# Patient Record
Sex: Female | Born: 1935
Health system: Southern US, Community
[De-identification: ages and names within clinical notes are randomized; demographics above are authoritative.]

## PROBLEM LIST (undated history)

## (undated) ENCOUNTER — Emergency Department (HOSPITAL_COMMUNITY): Discharge status: Absent without leave | Payer: Commercial Managed Care - HMO

## (undated) DIAGNOSIS — I5032 Chronic diastolic (congestive) heart failure: Secondary | ICD-10-CM

## (undated) DIAGNOSIS — M8580 Other specified disorders of bone density and structure, unspecified site: Secondary | ICD-10-CM

## (undated) DIAGNOSIS — E785 Hyperlipidemia, unspecified: Secondary | ICD-10-CM

## (undated) DIAGNOSIS — R0600 Dyspnea, unspecified: Secondary | ICD-10-CM

## (undated) DIAGNOSIS — I5189 Other ill-defined heart diseases: Secondary | ICD-10-CM

## (undated) DIAGNOSIS — I10 Essential (primary) hypertension: Secondary | ICD-10-CM

## (undated) HISTORY — DX: Other ill-defined heart diseases: I51.89

## (undated) HISTORY — DX: Chronic diastolic (congestive) heart failure: I50.32

## (undated) HISTORY — DX: Hyperlipidemia, unspecified: E78.5

## (undated) HISTORY — PX: APPENDECTOMY: SHX54

## (undated) HISTORY — PX: CATARACT EXTRACTION: SUR2

## (undated) HISTORY — PX: ROTATOR CUFF REPAIR: SHX139

## (undated) HISTORY — PX: CARDIAC VALVE REPLACEMENT: SHX585

## (undated) HISTORY — DX: Essential (primary) hypertension: I10

## (undated) HISTORY — DX: Other specified disorders of bone density and structure, unspecified site: M85.80

## (undated) HISTORY — DX: Dyspnea, unspecified: R06.00

## (undated) HISTORY — PX: TONSILLECTOMY: SUR1361

---

## 1998-04-13 ENCOUNTER — Inpatient Hospital Stay (HOSPITAL_COMMUNITY): Admission: RE | Admit: 1998-04-13 | Discharge: 1998-04-22 | Payer: Self-pay | Admitting: Cardiology

## 1998-09-07 ENCOUNTER — Ambulatory Visit (HOSPITAL_COMMUNITY): Admission: RE | Admit: 1998-09-07 | Discharge: 1998-09-07 | Payer: Self-pay | Admitting: Obstetrics & Gynecology

## 2001-01-16 ENCOUNTER — Ambulatory Visit (HOSPITAL_COMMUNITY): Admission: RE | Admit: 2001-01-16 | Discharge: 2001-01-16 | Payer: Self-pay | Admitting: Family Medicine

## 2001-01-16 ENCOUNTER — Encounter: Payer: Self-pay | Admitting: Family Medicine

## 2001-03-18 ENCOUNTER — Encounter (HOSPITAL_COMMUNITY): Admission: RE | Admit: 2001-03-18 | Discharge: 2001-06-16 | Payer: Self-pay | Admitting: Gastroenterology

## 2001-03-20 ENCOUNTER — Ambulatory Visit (HOSPITAL_COMMUNITY): Admission: RE | Admit: 2001-03-20 | Discharge: 2001-03-20 | Payer: Self-pay | Admitting: Gastroenterology

## 2001-03-20 ENCOUNTER — Encounter (INDEPENDENT_AMBULATORY_CARE_PROVIDER_SITE_OTHER): Payer: Self-pay | Admitting: Specialist

## 2001-11-14 ENCOUNTER — Encounter: Admission: RE | Admit: 2001-11-14 | Discharge: 2001-11-14 | Payer: Self-pay | Admitting: Family Medicine

## 2001-11-14 ENCOUNTER — Encounter: Payer: Self-pay | Admitting: Family Medicine

## 2001-12-13 ENCOUNTER — Ambulatory Visit (HOSPITAL_COMMUNITY): Admission: RE | Admit: 2001-12-13 | Discharge: 2001-12-13 | Payer: Self-pay | Admitting: Orthopaedic Surgery

## 2001-12-13 ENCOUNTER — Encounter: Payer: Self-pay | Admitting: Orthopaedic Surgery

## 2001-12-31 ENCOUNTER — Ambulatory Visit (HOSPITAL_BASED_OUTPATIENT_CLINIC_OR_DEPARTMENT_OTHER): Admission: RE | Admit: 2001-12-31 | Discharge: 2001-12-31 | Payer: Self-pay | Admitting: Orthopaedic Surgery

## 2003-08-19 ENCOUNTER — Other Ambulatory Visit: Admission: RE | Admit: 2003-08-19 | Discharge: 2003-08-19 | Payer: Self-pay | Admitting: Family Medicine

## 2004-07-14 ENCOUNTER — Ambulatory Visit (HOSPITAL_COMMUNITY): Admission: RE | Admit: 2004-07-14 | Discharge: 2004-07-14 | Payer: Self-pay | Admitting: Family Medicine

## 2005-07-25 ENCOUNTER — Ambulatory Visit (HOSPITAL_COMMUNITY): Admission: RE | Admit: 2005-07-25 | Discharge: 2005-07-25 | Payer: Self-pay | Admitting: Family Medicine

## 2005-08-15 ENCOUNTER — Ambulatory Visit (HOSPITAL_COMMUNITY): Admission: RE | Admit: 2005-08-15 | Discharge: 2005-08-15 | Payer: Self-pay | Admitting: Gastroenterology

## 2010-09-22 ENCOUNTER — Encounter: Admission: RE | Admit: 2010-09-22 | Discharge: 2010-09-22 | Payer: Self-pay | Admitting: Family Medicine

## 2010-09-23 ENCOUNTER — Ambulatory Visit (HOSPITAL_COMMUNITY): Admission: RE | Admit: 2010-09-23 | Discharge: 2010-09-23 | Payer: Self-pay | Admitting: Family Medicine

## 2011-03-24 NOTE — Op Note (Signed)
[place]  Patient:    Tracy Vargas, Tracy Vargas Visit Number: [id number] MRN: [id number]          Service Type: DSU Location: [facility] Attending Physician:  Marcene Corning Dictated by:   Lubertha Basque. Jerl Santos, M.D. Proc. Date: 12/31/01 Admit Date:  12/31/2001                             Operative Report  PREOPERATIVE DIAGNOSIS: 1. Left shoulder rotator cuff tear. 2. Left shoulder impingement.  POSTOPERATIVE DIAGNOSIS: 1. Left shoulder rotator cuff tear. 2. Left shoulder impingement.  OPERATION PERFORMED: 1. Left shoulder arthroscopic rotator cuff tear. 2. Left shoulder arthroscopic acromioplasty. 3. Left shoulder partial clavulectomy.  ANESTHESIA:  General and block.  ATTENDING SURGEON:  Lubertha Basque. Jerl Santos, M.D.  ASSISTANT:  Lindwood Qua, P.A.  INDICATIONS FOR PROCEDURE:  The patient is a 77 year old woman with about three months of intense left shoulder pain since a fall.  This has persisted despite oral anti-inflammatories and an injection which afforded her some transient relief.  She has undergone a preoperative MRI scan which showed significant tendinosis and a small full thickness rotator cuff tear.  She is offered an arthroscopy at this point.  The procedure was discussed with the patient and informed operative consent was obtained after discussion of possible complications of reaction to anesthesia and infection.  DESCRIPTION OF PROCEDURE:  The patient was taken to an operating suite where general anesthetic was applied without difficulty.  She was given a block in the preanesthesia area.  She was then positioned in beach chair position and prepped and draped in normal sterile fashion.  After the administration of preop intravenous antibiotics, an arthroscopy of the left shoulder was performed through a total of three portals.  The glenohumeral joint showed no degenerative change and the biceps tendon and all labral structures  well attached.   She had an obvious full thickness rotator cuff tear seen from below.  This was also seen from above.  She had a mild slope to her acromion which was addressed with an acromioplasty back to a flat surface.  This was done with a bur in the lateral position followed by transfer of the bur to the posterior position.  She also had some impingement under the acromioclavicular joint which was addressed with a partial clavulectomy without a formal decompression of the acromioclavicular joint itself.  The rotator cuff was examined and the anterior tear was well visualized directly at the attachmen6t point on the  greater tuberosity.  This was minimally retracted.  She appeared to have good tissues.  A bleeding bed of bone was created with an arthroscopic bur.  A standard rotator cuff anchor was placed but her bone was found to be fairly thin and this pulled up partially, as a result this was removed and the larger Arthrex anchor was placed with two Ethibond sutures emanating.  This appeared to be well fixed.  I passed one arm of each of the two sutures through the rotator cuff tear using the bird beak suture passer.  I then tied the knot above the rotator cuff.  This was a simple suture repair using both sutures.  We achieved a good reapproximation of cuff to the bleeding bed of bone.  The cuff appeared to be well repaired and under no tension with the arm at her side.  The shoulder was irrigated at the end of the case followed by placement of  Marcaine with epinephrine.  Simple sutures of nylon were used to loosely reapproximate her three portals followed by Adaptic and a dry gauze dressing and tape.  Estimated blood loss and intraoperative fluids can be obtained from Anesthesia records. DISPOSITION:  The patient was extubated in the operating room and taken the recovery room in stable condition.  Plans were for her to go home the same day and to follow up in the office in less than  a week.  I will contact her by phone tonight. Dictated by:   Lubertha Basque Jerl Santos, M.D. Attending Physician:  Marcene Corning DD:  12/31/01 TD:  12/31/01 Job: [id number]

## 2011-03-24 NOTE — Procedures (Signed)
[facility]  Patient:    Tracy Vargas, Tracy Vargas                         MRN: [id number] Proc. Date: 03/20/01 Adm. Date:  [id number] Attending:  Louie Bun CC:         Dario Guardian, M.D.  Timothy E. Earlene Plater, M.D.   Procedure Report  PROCEDURE:  Colonoscopy with polypectomy.  INDICATION FOR PROCEDURE:  Large rectal polyp found by Dr. Earlene Plater on digital rectal exam.  The patient has a prosthetic aortic valve and required reversal of Coumadin as well as prophylactic antibiotics for this procedure.  DESCRIPTION OF PROCEDURE:  The patient was placed in the left lateral decubitus position and placed on the pulse monitor with continuous low-flow oxygen delivered by nasal cannula.  She was sedated with 50 mg IV Demerol and 6 mg IV Versed.  The Olympus video colonoscope was inserted into the rectum and advanced to the cecum, confirmed by transillumination at McBurneys point and visualization of the ileocecal valve and appendiceal orifice.  The prep was good.  The cecum, ascending, transverse and descending colon all appeared normal with no masses polyps, diverticula, or other mucosal abnormalities. Within the sigmoid colon there was seen a few scattered diverticula.  The mucosa appeared normal down to the anus where about 2 cm from the anus there was seen a 2 cm sessile polyp which was just proximal to the dentate line.  It was then snared with the standard polyp snare and cautery applied.  The patient could not feel any sensation when this was done and after sufficient cautery, the snare cut through the poly with no bleeding ensuing.  It appeared that all of the polyp was removed.  The colonoscope was then withdrawn and the patient returned to the recovery room in stable condition.  She tolerated the procedure well, and there were no immediate complications.  IMPRESSION:  Large rectal polyp removed by snare, rule out malignancy.  PLAN:  Await biopsy results.   Will consider repeat sigmoidoscopy in three months to ensure lesion completely destroyed, assuming she does not require surgery. DD:  03/20/01 TD:  03/20/01 Job: [id number]

## 2012-07-16 ENCOUNTER — Other Ambulatory Visit (HOSPITAL_COMMUNITY): Payer: Self-pay | Admitting: Family Medicine

## 2012-07-16 DIAGNOSIS — M8580 Other specified disorders of bone density and structure, unspecified site: Secondary | ICD-10-CM

## 2012-07-16 DIAGNOSIS — Z1231 Encounter for screening mammogram for malignant neoplasm of breast: Secondary | ICD-10-CM

## 2012-08-05 ENCOUNTER — Ambulatory Visit (HOSPITAL_COMMUNITY)
Admission: RE | Admit: 2012-08-05 | Discharge: 2012-08-05 | Disposition: A | Discharge status: Home / Self Care | Payer: Medicare Other | Source: Ambulatory Visit | Attending: Family Medicine | Admitting: Family Medicine

## 2012-08-05 DIAGNOSIS — M8580 Other specified disorders of bone density and structure, unspecified site: Secondary | ICD-10-CM

## 2012-08-05 DIAGNOSIS — Z1231 Encounter for screening mammogram for malignant neoplasm of breast: Secondary | ICD-10-CM | POA: Insufficient documentation

## 2013-08-14 ENCOUNTER — Encounter: Payer: Self-pay | Admitting: Cardiology

## 2013-08-15 ENCOUNTER — Ambulatory Visit (HOSPITAL_COMMUNITY): Payer: Medicare Other

## 2013-08-15 ENCOUNTER — Ambulatory Visit (HOSPITAL_COMMUNITY): Payer: Medicare Other | Attending: Internal Medicine

## 2013-08-15 DIAGNOSIS — H3410 Central retinal artery occlusion, unspecified eye: Secondary | ICD-10-CM

## 2013-08-15 DIAGNOSIS — I658 Occlusion and stenosis of other precerebral arteries: Secondary | ICD-10-CM | POA: Insufficient documentation

## 2013-08-15 DIAGNOSIS — H349 Unspecified retinal vascular occlusion: Secondary | ICD-10-CM | POA: Insufficient documentation

## 2013-08-15 DIAGNOSIS — I6529 Occlusion and stenosis of unspecified carotid artery: Secondary | ICD-10-CM

## 2013-08-15 NOTE — Progress Notes (Signed)
Echo cancelled per Dr Sonny Masters Smith's office.  Patient had echo 3 months ago and she has appointment with Dr Anne Fu on Oct 22.  He will order an echo if he deems it necessary.

## 2013-08-19 ENCOUNTER — Telehealth: Payer: Self-pay | Admitting: Cardiology

## 2013-08-19 NOTE — Telephone Encounter (Deleted)
ERROR

## 2013-08-20 ENCOUNTER — Ambulatory Visit: Payer: Medicare Other | Admitting: Nurse Practitioner

## 2013-08-27 ENCOUNTER — Ambulatory Visit: Payer: Medicare Other | Admitting: Cardiology

## 2013-08-31 ENCOUNTER — Encounter: Payer: Self-pay | Admitting: Cardiology

## 2013-08-31 ENCOUNTER — Encounter: Payer: Self-pay | Admitting: *Deleted

## 2013-08-31 DIAGNOSIS — I10 Essential (primary) hypertension: Secondary | ICD-10-CM | POA: Insufficient documentation

## 2013-08-31 DIAGNOSIS — R0600 Dyspnea, unspecified: Secondary | ICD-10-CM | POA: Insufficient documentation

## 2013-08-31 DIAGNOSIS — I5189 Other ill-defined heart diseases: Secondary | ICD-10-CM | POA: Insufficient documentation

## 2013-08-31 DIAGNOSIS — E785 Hyperlipidemia, unspecified: Secondary | ICD-10-CM | POA: Insufficient documentation

## 2013-08-31 DIAGNOSIS — M8580 Other specified disorders of bone density and structure, unspecified site: Secondary | ICD-10-CM | POA: Insufficient documentation

## 2013-09-01 ENCOUNTER — Encounter (INDEPENDENT_AMBULATORY_CARE_PROVIDER_SITE_OTHER): Payer: Self-pay

## 2013-09-01 ENCOUNTER — Ambulatory Visit (INDEPENDENT_AMBULATORY_CARE_PROVIDER_SITE_OTHER): Payer: Medicare Other | Admitting: Cardiology

## 2013-09-01 ENCOUNTER — Encounter: Payer: Self-pay | Admitting: Cardiology

## 2013-09-01 VITALS — BP 146/76 | HR 74 | Ht 61.0 in | Wt 173.0 lb

## 2013-09-01 DIAGNOSIS — I10 Essential (primary) hypertension: Secondary | ICD-10-CM

## 2013-09-01 DIAGNOSIS — E785 Hyperlipidemia, unspecified: Secondary | ICD-10-CM

## 2013-09-01 DIAGNOSIS — Z954 Presence of other heart-valve replacement: Secondary | ICD-10-CM

## 2013-09-01 DIAGNOSIS — I5032 Chronic diastolic (congestive) heart failure: Secondary | ICD-10-CM

## 2013-09-01 DIAGNOSIS — Z952 Presence of prosthetic heart valve: Secondary | ICD-10-CM | POA: Insufficient documentation

## 2013-09-01 HISTORY — DX: Chronic diastolic (congestive) heart failure: I50.32

## 2013-09-01 MED ORDER — FUROSEMIDE 20 MG PO TABS: 20.0000 mg | ORAL_TABLET | Freq: Every day | ORAL | Status: DC

## 2013-09-01 NOTE — Progress Notes (Signed)
[address], [address] [place], [place]  [address] Phone: [phone] Fax:  [phone]  Date:  09/01/2013   ID:  Tracy Vargas, DOB 30-Sep-1934, MRN [id number]  PCP:  Allean Found, MD   History of Present Illness: Tracy Vargas is a 79 y.o. female with hypertension, prior mechanical heart valve here for follow up of increased dyspnea on exertion with associated murmur.  EKG 04/09/13 shows sinus rhythm with rare PVC. Heart rate 69.  Echocardiogram demonstrated normal ejection fraction, mechanical aortic valve with upper normal peak velocity.  Previously she described that when laying her head on pillow, heart valve has felt "hazy" not as clear as usualy. When walking up incline or tying shoes feels SOB. Has several allergies. No strokes. No CP. No syncope. She is usually very active. No prior CAD.   Since starting the low dose lasix 20mg  PO QD, this has improved. Her BP has improved as well. Diastolic dysfunction noted on echo.   On 09/01/13 visit, fell. Upper lip still with ecchymosis. It was dark outside, tripped.  Bladder suspension - bridge. Lovenox. Rectal bleeding. Stable.  Optmologist left eye clot seen. Carotid artery U/S.     Wt Readings from Last 3 Encounters:  09/01/13 173 lb (78.472 kg)     Past Medical History  Diagnosis Date  . HTN (hypertension)   . Hyperlipidemia   . Dyspnea   . Diastolic dysfunction   . Acquired hyperlipoproteinemia   . Osteopenia     Past Surgical History  Procedure Laterality Date  . Cardiac valve replacement    . Appendectomy    . Tonsillectomy    . Rotator cuff repair    . Cataract extraction      Current Outpatient Prescriptions  Medication Sig Dispense Refill  . alendronate (FOSAMAX) 70 MG tablet Take 70 mg by mouth every 7 (seven) days. Take with a full glass of water on an empty stomach.      Andrey Campanile VAGINAL 0.1 MG/GM vaginal cream       . lisinopril (PRINIVIL,ZESTRIL) 20 MG tablet Take 20 mg by mouth  daily.      . solifenacin (VESICARE) 5 MG tablet Take 10 mg by mouth daily. Every other day      . warfarin (COUMADIN) 5 MG tablet Take 5 mg by mouth as directed.      . furosemide (LASIX) 20 MG tablet Take 20 mg by mouth.       No current facility-administered medications for this visit.    Allergies:    Allergies  Allergen Reactions  . Codeine     Halluciations  . Sudafed [Pseudoephedrine Hcl]     Halluications    Social History:  The patient     ROS:  Please see the history of present illness.   No syncope, no bleeding, no orthopnea, no PND    PHYSICAL EXAM: VS:  BP 146/76  Pulse 74  Ht 5\' 1"  (1.549 m)  Wt 173 lb (78.472 kg)  BMI 32.7 kg/m2 Well nourished, well developed, in no acute distress HEENT: uppper lip with bruise, fall.  Neck: no JVD Cardiac:  normal S1, sharp S2 click; RRR; no murmur Lungs:  clear to auscultation bilaterally, no wheezing, rhonchi or rales Abd: soft, nontender, no hepatomegaly Ext: no edema Skin: warm and dry Neuro: no focal abnormalities noted  EKG:  04/09/13: Sinus rhythm rate 69 with PVC     ASSESSMENT AND PLAN:  1. Shortness of  breath/diastolic heart failure-continuing with low-dose Lasix. Helping. 2. History of mechanical aortic valve-stable. Prior echocardiogram reviewed. Chronic anticoagulation. 3. Hypertension-minimally elevated today. Overall good control. She's getting back on her Lasix. 4. Hyperlipidemia-dietary modification. 5. Left eye visual change-ophthalmologist picked up occlusion left eye. Carotid Dopplers performed demonstrating bilateral mild carotid plaque. 6. Recent fall-mechanical fall. No dizziness, no syncope. 7. Chronic anticoagulation-following recent bladder surgery, Lovenox bridge, rectal bleeding. This has resolved.  Signed, Donato Schultz, MD [facility]  09/01/2013 1:42 PM

## 2013-09-01 NOTE — Patient Instructions (Signed)
Your physician wants you to follow-up in: 6 Months with Dr. Anne Fu. You will receive a reminder letter in the mail two months in advance. If you don't receive a letter, please call our office to schedule the follow-up appointment.  Your physician recommends that you continue on your current medications as directed. Please refer to the Current Medication list given to you today.  Refilled lasix to your pharmacy.

## 2013-09-18 ENCOUNTER — Encounter (HOSPITAL_COMMUNITY): Payer: Self-pay | Admitting: Family Medicine

## 2014-03-06 ENCOUNTER — Encounter: Payer: Self-pay | Admitting: Cardiology

## 2014-03-09 ENCOUNTER — Encounter: Payer: Self-pay | Admitting: Cardiology

## 2014-09-11 ENCOUNTER — Other Ambulatory Visit (HOSPITAL_COMMUNITY): Payer: Self-pay | Admitting: *Deleted

## 2014-09-11 DIAGNOSIS — I6523 Occlusion and stenosis of bilateral carotid arteries: Secondary | ICD-10-CM

## 2014-09-22 ENCOUNTER — Ambulatory Visit (HOSPITAL_COMMUNITY): Payer: Medicare HMO | Attending: Cardiology | Admitting: *Deleted

## 2014-09-22 DIAGNOSIS — E785 Hyperlipidemia, unspecified: Secondary | ICD-10-CM | POA: Diagnosis not present

## 2014-09-22 DIAGNOSIS — I10 Essential (primary) hypertension: Secondary | ICD-10-CM | POA: Diagnosis not present

## 2014-09-22 DIAGNOSIS — I6523 Occlusion and stenosis of bilateral carotid arteries: Secondary | ICD-10-CM

## 2014-09-22 NOTE — Progress Notes (Signed)
Carotid Duplex Performed 

## 2014-09-24 ENCOUNTER — Telehealth: Payer: Self-pay

## 2014-09-24 NOTE — Telephone Encounter (Signed)
Called to give pt carotid results. [email or web address]. 

## 2014-09-24 NOTE — Telephone Encounter (Signed)
-----   Message from Lesleigh NoeHenry W Smith III, MD sent at 09/23/2014  4:45 PM EST ----- <40 bilateral carotid stenosis. F/U study in 2 years

## 2014-09-25 NOTE — Telephone Encounter (Signed)
-----   Message from Henry W Smith III, MD sent at 09/23/2014  4:45 PM EST ----- <40 bilateral carotid stenosis. F/U study in 2 years 

## 2014-09-25 NOTE — Telephone Encounter (Signed)
F/u    Patient Tracy DaltonDoris Vargas returning call and would like to be contacted after 2pm at [phone]

## 2014-09-25 NOTE — Telephone Encounter (Signed)
Pt aware of carotid  Results.  <40 bilateral carotid stenosis. F/U study in 2 years  Pt verbalized understanding.

## 2014-10-19 ENCOUNTER — Telehealth: Payer: Self-pay | Admitting: General Practice

## 2014-10-19 ENCOUNTER — Other Ambulatory Visit: Payer: Self-pay | Admitting: *Deleted

## 2014-10-19 MED ORDER — FUROSEMIDE 20 MG PO TABS: 20.0000 mg | ORAL_TABLET | Freq: Every day | ORAL | Status: DC

## 2014-10-19 NOTE — Telephone Encounter (Signed)
New Message        Patient need appt for any more refills appt made 11/11/13 [facility] [place]

## 2014-11-11 ENCOUNTER — Ambulatory Visit (INDEPENDENT_AMBULATORY_CARE_PROVIDER_SITE_OTHER): Payer: Commercial Managed Care - HMO | Admitting: Cardiology

## 2014-11-11 ENCOUNTER — Encounter: Payer: Self-pay | Admitting: Cardiology

## 2014-11-11 VITALS — BP 130/82 | HR 68 | Ht 61.0 in | Wt 169.0 lb

## 2014-11-11 DIAGNOSIS — Z954 Presence of other heart-valve replacement: Secondary | ICD-10-CM

## 2014-11-11 DIAGNOSIS — I10 Essential (primary) hypertension: Secondary | ICD-10-CM

## 2014-11-11 DIAGNOSIS — I5032 Chronic diastolic (congestive) heart failure: Secondary | ICD-10-CM

## 2014-11-11 DIAGNOSIS — E785 Hyperlipidemia, unspecified: Secondary | ICD-10-CM

## 2014-11-11 DIAGNOSIS — Z952 Presence of prosthetic heart valve: Secondary | ICD-10-CM

## 2014-11-11 NOTE — Patient Instructions (Signed)
Your physician recommends that you continue on your current medications as directed. Please refer to the Current Medication list given to you today.  Your physician wants you to follow-up in: 1 year with Dr. Skains. You will receive a reminder letter in the mail two months in advance. If you don't receive a letter, please call our office to schedule the follow-up appointment.  

## 2014-11-11 NOTE — Progress Notes (Signed)
[address] [place], [place]  [address] Phone: [phone] Fax:  [phone]  Date:  11/11/2014   ID:  Tracy Vargas, DOB 09-16-1934, MRN [id number]  PCP:  Allean Found, MD   History of Present Illness: Tracy Vargas is a 81 y.o. female with hypertension, prior mechanical heart valve here for follow up of increased dyspnea on exertion with associated murmur.  EKG 04/09/13 shows sinus rhythm with rare PVC. Heart rate 69.  Echocardiogram demonstrated normal ejection fraction, mechanical aortic valve with upper normal peak velocity.  Previously she described that when laying her head on pillow, heart valve has felt "hazy" not as clear as usualy. When walking up incline or tying shoes feels SOB. Has several allergies. No strokes. No CP. No syncope. She is usually very active. No prior CAD.   Since starting the low dose lasix  PO QD, this has improved. Her BP has improved as well. Diastolic dysfunction noted on echo.   On 09/01/13 visit, fell. Upper lip still with ecchymosis. It was dark outside, tripped.  Bladder suspension - bridge. Lovenox. Rectal bleeding. Stable.  Optmologist left eye clot seen. Carotid artery U/S.   11/11/14 - Feels PVC's occasionally. Feels well otherwise.     Wt Readings from Last 3 Encounters:  11/11/14 169 lb (76.658 kg)  09/01/13 173 lb (78.472 kg)     Past Medical History  Diagnosis Date  . HTN (hypertension)   . Hyperlipidemia   . Dyspnea   . Diastolic dysfunction   . Acquired hyperlipoproteinemia   . Osteopenia   . Chronic diastolic heart failure 09/01/2013    Past Surgical History  Procedure Laterality Date  . Cardiac valve replacement    . Appendectomy    . Tonsillectomy    . Rotator cuff repair    . Cataract extraction      Current Outpatient Prescriptions  Medication Sig Dispense Refill  . alendronate (FOSAMAX) 70 MG tablet Take 70 mg by mouth every 7 (seven) days. Take with a full glass of water on an  empty stomach.    Marland Kitchen atorvastatin (LIPITOR) 10 MG tablet     . furosemide (LASIX) 20 MG tablet Take 1 tablet (20 mg total) by mouth daily. 90 tablet 0  . lisinopril (PRINIVIL,ZESTRIL) 20 MG tablet Take 20 mg by mouth daily.    Marland Kitchen warfarin (COUMADIN) 5 MG tablet Take 5 mg by mouth as directed.     No current facility-administered medications for this visit.    Allergies:    Allergies  Allergen Reactions  . Codeine     Halluciations  . Sudafed [Pseudoephedrine Hcl]     Halluications    Social History:  The patient  reports that she has never smoked. She does not have any smokeless tobacco history on file.   ROS:  Please see the history of present illness.   No syncope, no bleeding, no orthopnea, no PND    PHYSICAL EXAM: VS:  BP 130/82 mmHg  Pulse 68  Ht  (1.549 m)  Wt 169 lb (76.658 kg)  BMI 31.95 kg/m2 Well nourished, well developed, in no acute distress HEENT: uppper lip with bruise, fall.  Neck: no JVD Cardiac:  normal S1, sharp S2 click; RRR; no murmur Lungs:  clear to auscultation bilaterally, no wheezing, rhonchi or rales Abd: soft, nontender, no hepatomegaly Ext: no edema Skin: warm and dry Neuro: no focal abnormalities noted  EKG:  11/11/14-sinus rhythm, PVC, heart rate  68, no other obvious abnormalities.-Prior xx6/4/14: Sinus rhythm rate 69 with PVC     ASSESSMENT AND PLAN:  1. Shortness of breath/diastolic heart failure-continuing with low-dose Lasix. Helping. 2. History of mechanical aortic valve-stable. Prior echocardiogram reviewed. Chronic anticoagulation. 3. Hypertension- Overall good control. ACE inhibitor, Lasix 4. Hyperlipidemia-dietary modification and atorvastatin low dose. Dr. Katrinka BlazingSmith is watching. 5. Left eye visual change-half of vision, laser surgery. ophthalmologist picked up occlusion left eye. Carotid Dopplers performed demonstrating bilateral mild carotid plaque. 6. Recent fall-prior mechanical fall. No dizziness, no syncope. Discussed risks with  anticoagulation. 7. Chronic anticoagulation-doing well. Dr. Katrinka BlazingSmith following.  Signed, Donato SchultzMark Nohely Whitehorn, MD [facility]  11/11/2014 10:32 AM

## 2015-02-17 ENCOUNTER — Other Ambulatory Visit: Payer: Self-pay | Admitting: Cardiology

## 2015-06-21 DIAGNOSIS — N8111 Cystocele, midline: Secondary | ICD-10-CM | POA: Diagnosis not present

## 2015-07-20 DIAGNOSIS — Z7901 Long term (current) use of anticoagulants: Secondary | ICD-10-CM | POA: Diagnosis not present

## 2015-07-20 DIAGNOSIS — R319 Hematuria, unspecified: Secondary | ICD-10-CM | POA: Diagnosis not present

## 2015-07-20 DIAGNOSIS — I4891 Unspecified atrial fibrillation: Secondary | ICD-10-CM | POA: Diagnosis not present

## 2015-07-27 ENCOUNTER — Other Ambulatory Visit: Payer: Self-pay | Admitting: Family Medicine

## 2015-07-27 ENCOUNTER — Ambulatory Visit
Admission: RE | Admit: 2015-07-27 | Discharge: 2015-07-27 | Disposition: A | Discharge status: Home / Self Care | Payer: Commercial Managed Care - HMO | Source: Ambulatory Visit | Attending: Family Medicine | Admitting: Family Medicine

## 2015-07-27 DIAGNOSIS — M25552 Pain in left hip: Secondary | ICD-10-CM

## 2015-08-02 ENCOUNTER — Other Ambulatory Visit: Payer: Self-pay | Admitting: Family Medicine

## 2015-08-02 ENCOUNTER — Ambulatory Visit
Admission: RE | Admit: 2015-08-02 | Discharge: 2015-08-02 | Disposition: A | Discharge status: Home / Self Care | Payer: Commercial Managed Care - HMO | Source: Ambulatory Visit | Attending: Family Medicine | Admitting: Family Medicine

## 2015-08-02 DIAGNOSIS — M25552 Pain in left hip: Secondary | ICD-10-CM

## 2015-12-06 ENCOUNTER — Ambulatory Visit
Admission: RE | Admit: 2015-12-06 | Discharge: 2015-12-06 | Disposition: A | Discharge status: Home / Self Care | Payer: Commercial Managed Care - HMO | Source: Ambulatory Visit | Attending: Family Medicine | Admitting: Family Medicine

## 2015-12-06 ENCOUNTER — Other Ambulatory Visit: Payer: Self-pay | Admitting: Family Medicine

## 2015-12-06 DIAGNOSIS — M545 Low back pain: Secondary | ICD-10-CM

## 2016-07-12 ENCOUNTER — Inpatient Hospital Stay (HOSPITAL_COMMUNITY)
Admission: EM | Admit: 2016-07-12 | Discharge: 2016-07-17 | DRG: 378 | Disposition: A | Discharge status: Home / Self Care | Payer: Commercial Managed Care - HMO | Attending: Internal Medicine | Admitting: Internal Medicine

## 2016-07-12 ENCOUNTER — Encounter (HOSPITAL_COMMUNITY): Payer: Self-pay | Admitting: *Deleted

## 2016-07-12 DIAGNOSIS — K254 Chronic or unspecified gastric ulcer with hemorrhage: Principal | ICD-10-CM | POA: Diagnosis present

## 2016-07-12 DIAGNOSIS — M8580 Other specified disorders of bone density and structure, unspecified site: Secondary | ICD-10-CM | POA: Diagnosis present

## 2016-07-12 DIAGNOSIS — T604X1A Toxic effect of rodenticides, accidental (unintentional), initial encounter: Secondary | ICD-10-CM

## 2016-07-12 DIAGNOSIS — Z954 Presence of other heart-valve replacement: Secondary | ICD-10-CM | POA: Diagnosis not present

## 2016-07-12 DIAGNOSIS — K922 Gastrointestinal hemorrhage, unspecified: Secondary | ICD-10-CM | POA: Diagnosis present

## 2016-07-12 DIAGNOSIS — R791 Abnormal coagulation profile: Secondary | ICD-10-CM | POA: Diagnosis present

## 2016-07-12 DIAGNOSIS — Z79899 Other long term (current) drug therapy: Secondary | ICD-10-CM | POA: Diagnosis not present

## 2016-07-12 DIAGNOSIS — I5032 Chronic diastolic (congestive) heart failure: Secondary | ICD-10-CM | POA: Diagnosis not present

## 2016-07-12 DIAGNOSIS — Z952 Presence of prosthetic heart valve: Secondary | ICD-10-CM

## 2016-07-12 DIAGNOSIS — R197 Diarrhea, unspecified: Secondary | ICD-10-CM | POA: Diagnosis present

## 2016-07-12 DIAGNOSIS — E785 Hyperlipidemia, unspecified: Secondary | ICD-10-CM | POA: Diagnosis not present

## 2016-07-12 DIAGNOSIS — Z8249 Family history of ischemic heart disease and other diseases of the circulatory system: Secondary | ICD-10-CM | POA: Diagnosis not present

## 2016-07-12 DIAGNOSIS — Z7983 Long term (current) use of bisphosphonates: Secondary | ICD-10-CM | POA: Diagnosis not present

## 2016-07-12 DIAGNOSIS — D62 Acute posthemorrhagic anemia: Secondary | ICD-10-CM | POA: Diagnosis not present

## 2016-07-12 DIAGNOSIS — Z885 Allergy status to narcotic agent status: Secondary | ICD-10-CM

## 2016-07-12 DIAGNOSIS — K921 Melena: Secondary | ICD-10-CM | POA: Diagnosis present

## 2016-07-12 DIAGNOSIS — Z7901 Long term (current) use of anticoagulants: Secondary | ICD-10-CM

## 2016-07-12 DIAGNOSIS — K449 Diaphragmatic hernia without obstruction or gangrene: Secondary | ICD-10-CM | POA: Diagnosis not present

## 2016-07-12 DIAGNOSIS — I110 Hypertensive heart disease with heart failure: Secondary | ICD-10-CM | POA: Diagnosis not present

## 2016-07-12 DIAGNOSIS — I10 Essential (primary) hypertension: Secondary | ICD-10-CM | POA: Diagnosis present

## 2016-07-12 DIAGNOSIS — Z888 Allergy status to other drugs, medicaments and biological substances status: Secondary | ICD-10-CM | POA: Diagnosis not present

## 2016-07-12 DIAGNOSIS — T45511A Poisoning by anticoagulants, accidental (unintentional), initial encounter: Secondary | ICD-10-CM

## 2016-07-12 LAB — COMPREHENSIVE METABOLIC PANEL
ALT: 21 U/L (ref 14–54)
AST: 30 U/L (ref 15–41)
Albumin: 3.8 g/dL (ref 3.5–5.0)
Alkaline Phosphatase: 66 U/L (ref 38–126)
Anion gap: 7 (ref 5–15)
BUN: 39 mg/dL — ABNORMAL HIGH (ref 6–20)
CO2: 23 mmol/L (ref 22–32)
Calcium: 8.9 mg/dL (ref 8.9–10.3)
Chloride: 105 mmol/L (ref 101–111)
Creatinine, Ser: 0.85 mg/dL (ref 0.44–1.00)
GFR calc Af Amer: >60 mL/min (ref >60)
GFR calc non Af Amer: >60 mL/min (ref >60)
Glucose, Bld: 104 mg/dL — ABNORMAL HIGH (ref 65–99)
Potassium: 4.3 mmol/L (ref 3.5–5.1)
Sodium: 135 mmol/L (ref 135–145)
Total Bilirubin: 0.2 mg/dL — ABNORMAL LOW (ref 0.3–1.2)
Total Protein: 6.3 g/dL — ABNORMAL LOW (ref 6.5–8.1)

## 2016-07-12 LAB — CBC
HCT: 36.3 % (ref 36.0–46.0)
Hemoglobin: 11.4 g/dL — ABNORMAL LOW (ref 12.0–15.0)
MCH: 27.9 pg (ref 26.0–34.0)
MCHC: 31.4 g/dL (ref 30.0–36.0)
MCV: 88.8 fL (ref 78.0–100.0)
Platelets: 314 10*3/uL (ref 150–400)
RBC: 4.09 MIL/uL (ref 3.87–5.11)
RDW: 14.7 % (ref 11.5–15.5)
WBC: 9 10*3/uL (ref 4.0–10.5)

## 2016-07-12 LAB — POC OCCULT BLOOD, ED: Fecal Occult Bld: POSITIVE — AB

## 2016-07-12 LAB — PROTIME-INR
INR: 6.49
Prothrombin Time: 59 seconds — ABNORMAL HIGH (ref 11.4–15.2)

## 2016-07-12 MED ORDER — VITAMIN K1 10 MG/ML IJ SOLN
2.5000 mg | Freq: Once | INTRAVENOUS | Status: AC
  Administered 2016-07-13: 2.5 mg via INTRAVENOUS
  Filled 2016-07-12: qty 0.25

## 2016-07-12 MED ORDER — ATORVASTATIN CALCIUM 10 MG PO TABS
10.0000 mg | ORAL_TABLET | Freq: Every day | ORAL | Status: DC
  Administered 2016-07-13 – 2016-07-16 (×4): 10 mg via ORAL
  Filled 2016-07-12 (×4): qty 1

## 2016-07-12 MED ORDER — PANTOPRAZOLE SODIUM 40 MG PO TBEC
80.0000 mg | DELAYED_RELEASE_TABLET | Freq: Every day | ORAL | Status: DC
  Administered 2016-07-13 (×2): 80 mg via ORAL
  Filled 2016-07-12 (×2): qty 2

## 2016-07-12 MED ORDER — LISINOPRIL 10 MG PO TABS
20.0000 mg | ORAL_TABLET | Freq: Every day | ORAL | Status: DC
  Filled 2016-07-12: qty 2

## 2016-07-12 NOTE — ED Notes (Signed)
This Consulting civil engineerCharge RN received a [facility]  Pt's chart will be dismissed.

## 2016-07-12 NOTE — ED Triage Notes (Signed)
The pt has no pain anywhere

## 2016-07-12 NOTE — H&P (Signed)
History and Physical    Tracy AlarDoris E Murdock ZOX:[id number] DOB: 25-Oct-1934 DOA: 07/12/2016   PCP: Allean FoundSMITH,CANDACE THIELE, MD Chief Complaint:  Chief Complaint  Patient presents with  . GI Problem    HPI: Tracy Vargas is a 82 y.o. female with medical history significant of mechanical AVR, on chronic coumadin.  Patient presents to ED for evaluation of known INR of 6.6, 2 weeks ago at Dr. Lonn GeorgiaSmiths office and melanotic stools over the past 5 days.  She was seen earlier today at her PCPs office who noted repeat INR elevation further > 8 by their office labs today in office (would be 6.5 in ED today).  Due to development of 5 days of dark stools however they referred her to the ED today.  Symptoms onset 5 days ago, are persistent, quality of stool is dark in color.  Nothing makes symptoms worse, baking soda PO hasnt made symptoms better.  ED Course: Stool is dark, heme positive, but patient reports that unlike 2 days ago is hard (not soft).  HGB 11.4 (was 11.8 in office at 4pm today).  Review of Systems: As per HPI otherwise 10 point review of systems negative.    Past Medical History:  Diagnosis Date  . Acquired hyperlipoproteinemia   . Chronic diastolic heart failure (HCC) 09/01/2013  . Diastolic dysfunction   . Dyspnea   . HTN (hypertension)   . Hyperlipidemia   . Osteopenia     Past Surgical History:  Procedure Laterality Date  . APPENDECTOMY    . CARDIAC VALVE REPLACEMENT    . CATARACT EXTRACTION    . ROTATOR CUFF REPAIR    . TONSILLECTOMY       reports that she has never smoked. She has never used smokeless tobacco. She reports that she does not drink alcohol. Her drug history is not on file.  Allergies  Allergen Reactions  . Codeine     Halluciations  . Sudafed [Pseudoephedrine Hcl]     Halluications    Family History  Problem Relation Age of Onset  . Heart failure Sister       Prior to Admission medications   Medication Sig Start Date End Date Taking? Authorizing  Provider  alendronate (FOSAMAX) 70 MG tablet Take 70 mg by mouth every 7 (seven) days. Take with a full glass of water on an empty stomach. No specific day   Yes Historical Provider, MD  atorvastatin (LIPITOR) 10 MG tablet Take 10 mg by mouth daily at 6 PM.  09/11/14  Yes Historical Provider, MD  furosemide (LASIX) 20 MG tablet Take 20 mg by mouth every 7 (seven) days. Take once a week per patient . No specific days   Yes Historical Provider, MD  lisinopril (PRINIVIL,ZESTRIL) 20 MG tablet Take 20 mg by mouth daily.   Yes Historical Provider, MD  warfarin (COUMADIN) 5 MG tablet Take 2.5-5 mg by mouth as directed. Patient states she takes a whole tablet (5mg ) some days then take a half tablet (2.5mg ) other days per patient. Patient states she is not sure what days she takes different doses on.   Yes Historical Provider, MD    Physical Exam: Vitals:   07/12/16 2008 07/12/16 2122 07/12/16 2130 07/12/16 2230  BP: 141/73 138/62 150/75 111/59  Pulse: 85 80 83 82  Resp: 22 17 (!) 43 17  Temp:      TempSrc:      SpO2: 99% 100% 100% 99%      Constitutional: NAD, calm, comfortable Eyes: PERRL,  lids and conjunctivae normal ENMT: Mucous membranes are moist. Posterior pharynx clear of any exudate or lesions.Normal dentition.  Neck: normal, supple, no masses, no thyromegaly Respiratory: clear to auscultation bilaterally, no wheezing, no crackles. Normal respiratory effort. No accessory muscle use.  Cardiovascular: Regular rate and rhythm, no murmurs / rubs / gallops. No extremity edema. 2+ pedal pulses. No carotid bruits.  Abdomen: no tenderness, no masses palpated. No hepatosplenomegaly. Bowel sounds positive.  Musculoskeletal: no clubbing / cyanosis. No joint deformity upper and lower extremities. Good ROM, no contractures. Normal muscle tone.  Skin: no rashes, lesions, ulcers. No induration Neurologic: CN 2-12 grossly intact. Sensation intact, DTR normal. Strength 5/5 in all 4.  Psychiatric: Normal  judgment and insight. Alert and oriented x 3. Normal mood.    Labs on Admission: I have personally reviewed following labs and imaging studies  CBC:  Recent Labs Lab 07/12/16 1728  WBC 9.0  HGB 11.4*  HCT 36.3  MCV 88.8  PLT 314   Basic Metabolic Panel:  Recent Labs Lab 07/12/16 1728  NA 135  K 4.3  CL 105  CO2 23  GLUCOSE 104*  BUN 39*  CREATININE 0.85  CALCIUM 8.9   GFR: CrCl cannot be calculated (Unknown ideal weight.). Liver Function Tests:  Recent Labs Lab 07/12/16 1728  AST 30  ALT 21  ALKPHOS 66  BILITOT 0.2*  PROT 6.3*  ALBUMIN 3.8   No results for input(s): LIPASE, AMYLASE in the last 168 hours. No results for input(s): AMMONIA in the last 168 hours. Coagulation Profile:  Recent Labs Lab 07/12/16 2102  INR 6.49*   Cardiac Enzymes: No results for input(s): CKTOTAL, CKMB, CKMBINDEX, TROPONINI in the last 168 hours. BNP (last 3 results) No results for input(s): PROBNP in the last 8760 hours. HbA1C: No results for input(s): HGBA1C in the last 72 hours. CBG: No results for input(s): GLUCAP in the last 168 hours. Lipid Profile: No results for input(s): CHOL, HDL, LDLCALC, TRIG, CHOLHDL, LDLDIRECT in the last 72 hours. Thyroid Function Tests: No results for input(s): TSH, T4TOTAL, FREET4, T3FREE, THYROIDAB in the last 72 hours. Anemia Panel: No results for input(s): VITAMINB12, FOLATE, FERRITIN, TIBC, IRON, RETICCTPCT in the last 72 hours. Urine analysis: No results found for: COLORURINE, APPEARANCEUR, LABSPEC, PHURINE, GLUCOSEU, HGBUR, BILIRUBINUR, KETONESUR, PROTEINUR, UROBILINOGEN, NITRITE, LEUKOCYTESUR Sepsis Labs: @LABRCNTIP (procalcitonin:4,lacticidven:4) )No results found for this or any previous visit (from the past 240 hour(s)).   Radiological Exams on Admission: No results found.  EKG: Independently reviewed.  Assessment/Plan Principal Problem:   GI bleed Active Problems:   H/O mechanical aortic valve  replacement    1. GIB - in setting of INR of 6.5 in setting of coumadin use for mechanical aortic valve 1. Heme positive stool, sounds like a very slow bleed at the moment, HGB still 11.4.  Stool today has been hard and formed. 2. At this point will go for a partial reversal of coumadin (try to reduce INR to the therapeutic range): 1. Spoke with Sharlot Gowda PharmD, he recommended Vit K 2.5mg  IV x1 2. repeat INR in AM 3. Call GI in AM 4. Clear liquid diet 5. Empiric PO PPI 6. Repeat CBC in AM 7. Tele monitor 8. If bleeding worsens clinically or has major hemoglobin drop, then will need to do urgent full reversal of coumadin with Theodoro Parma / FFP, but trying to avoid this at the moment due to risk of thrombus formation with mechanical aortic valve when weighed against the relatively slow GIB at the moment.  DVT prophylaxis: Supratheraputic INR Code Status: Full Family Communication: Husband at bedside Consults called: None Admission status: Admit to inpatient   Hillary Bow DO [facility] Pager [phone] from 7PM-7AM  If 7AM-7PM, please contact the day physician for the patient [email or web address] Password [email or web address]  07/12/2016, 11:16 PM

## 2016-07-12 NOTE — ED Notes (Signed)
Pt is currently waiting in the waiting room pts vss

## 2016-07-12 NOTE — ED Triage Notes (Signed)
The pt was sent from her doctors office she has had diarrhea stools since Saturday and she has also had black stools.  She is on coumadin.  She had blood drawn in the doctors ofice and her hgb was 11.8  She had diarrhea until today and not has hard stools

## 2016-07-12 NOTE — ED Notes (Signed)
MD at bedside. 

## 2016-07-12 NOTE — ED Provider Notes (Signed)
[facility] DEPT Provider Note   CSN: [id number] Arrival date & time: 07/12/16  1715     History   Chief Complaint Chief Complaint  Patient presents with  . GI Problem    HPI Tracy Vargas is a 82 y.o. female.  She presents for evaluation of known elevated INR greater than 8, and diarrhea for several days. She reports onset of diarrhea with dark colored stool, without visible blood in it, 5 days ago. Been somewhat nauseated and had a decreased appetite during this time. She's not been vomiting. She denies weakness, dizziness, fever, chills, chest pain, back pain or abdominal pain. He saw her PCP today, had labs and was sent here for evaluation. She's had a mild off ongoing for several weeks. She occasionally produces yellow sputum. She had an elevated INR on 06/29/2016, 6.6. Apparently, her Coumadin dose was adjusted at that time. Today in the office, her hemoglobin was 11.8. When seen on 06/29/2016 it was 12.5. Her doctor sent her here for evaluation and observation for rectal bleeding with elevated INR. There are no other known modifying factors.  HPI  Past Medical History:  Diagnosis Date  . Acquired hyperlipoproteinemia   . Chronic diastolic heart failure (HCC) 09/01/2013  . Diastolic dysfunction   . Dyspnea   . HTN (hypertension)   . Hyperlipidemia   . Osteopenia     Patient Active Problem List   Diagnosis Date Noted  . Chronic diastolic heart failure (HCC) 09/01/2013  . H/O mechanical aortic valve replacement 09/01/2013  . HTN (hypertension)   . Hyperlipidemia   . Dyspnea   . Diastolic dysfunction   . Acquired hyperlipoproteinemia   . Osteopenia     Past Surgical History:  Procedure Laterality Date  . APPENDECTOMY    . CARDIAC VALVE REPLACEMENT    . CATARACT EXTRACTION    . ROTATOR CUFF REPAIR    . TONSILLECTOMY      OB History    No data available       Home Medications    Prior to Admission medications   Medication Sig Start Date End Date  Taking? Authorizing Provider  alendronate (FOSAMAX) 70 MG tablet Take 70 mg by mouth every 7 (seven) days. Take with a full glass of water on an empty stomach. No specific day   Yes Historical Provider, MD  atorvastatin (LIPITOR) 10 MG tablet Take 10 mg by mouth daily at 6 PM.  09/11/14  Yes Historical Provider, MD  furosemide (LASIX) 20 MG tablet TAKE ONE TABLET BY MOUTH ONCE DAILY 02/17/15  Yes Jake Bathe, MD  furosemide (LASIX) 20 MG tablet Take 20 mg by mouth every 7 (seven) days. Take once a week per patient . No specific days   Yes Historical Provider, MD  lisinopril (PRINIVIL,ZESTRIL) 20 MG tablet Take 20 mg by mouth daily.   Yes Historical Provider, MD  warfarin (COUMADIN) 5 MG tablet Take 2.5-5 mg by mouth as directed. Patient states she takes a whole tablet (5mg ) some days then take a half tablet (2.5mg ) other days per patient. Patient states she is not sure what days she takes different doses on.   Yes Historical Provider, MD    Family History Family History  Problem Relation Age of Onset  . Heart failure Sister     Social History Social History  Substance Use Topics  . Smoking status: Never Smoker  . Smokeless tobacco: Never Used  . Alcohol use No     Allergies   Codeine and  Sudafed [pseudoephedrine hcl]   Review of Systems Review of Systems  All other systems reviewed and are negative.    Physical Exam Updated Vital Signs BP 138/62   Pulse 80   Temp 98 F (36.7 C) (Oral)   Resp 17   SpO2 100%   Physical Exam  Constitutional: She is oriented to person, place, and time. She appears well-developed.  Elderly, frail  HENT:  Head: Normocephalic and atraumatic.  Eyes: Conjunctivae and EOM are normal. Pupils are equal, round, and reactive to light. Right eye exhibits no discharge. Left eye exhibits no discharge. No scleral icterus.  Neck: Normal range of motion and phonation normal. Neck supple.  Cardiovascular: Normal rate and regular rhythm.     Pulmonary/Chest: Effort normal and breath sounds normal. She exhibits no tenderness.  Abdominal: Soft. She exhibits no distension. There is no tenderness. There is no guarding.  Genitourinary:  Genitourinary Comments: Normal anus. Small amount of firm stool in the rectum, which is dark in color. No visible bleeding mass or hemorrhoids, seen on exam.  Musculoskeletal: Normal range of motion.  Neurological: She is alert and oriented to person, place, and time. She exhibits normal muscle tone.  Skin: Skin is warm and dry.  Psychiatric: She has a normal mood and affect. Her behavior is normal. Judgment and thought content normal.  Nursing note and vitals reviewed.    ED Treatments / Results  Labs (all labs ordered are listed, but only abnormal results are displayed) Labs Reviewed  COMPREHENSIVE METABOLIC PANEL - Abnormal; Notable for the following:       Result Value   Glucose, Bld 104 (*)    BUN 39 (*)    Total Protein 6.3 (*)    Total Bilirubin 0.2 (*)    All other components within normal limits  CBC - Abnormal; Notable for the following:    Hemoglobin 11.4 (*)    All other components within normal limits  PROTIME-INR - Abnormal; Notable for the following:    Prothrombin Time 59.0 (*)    INR 6.49 (*)    All other components within normal limits  POC OCCULT BLOOD, ED - Abnormal; Notable for the following:    Fecal Occult Bld POSITIVE (*)    All other components within normal limits  POC OCCULT BLOOD, ED    EKG  EKG Interpretation None       Radiology No results found.  Procedures Procedures (including critical care time)  Medications Ordered in ED Medications - No data to display   Initial Impression / Assessment and Plan / ED Course  I have reviewed the triage vital signs and the nursing notes.  Pertinent labs & imaging results that were available during my care of the patient were reviewed by me and considered in my medical decision making (see chart for  details).  Clinical Course    Medications - No data to display  Patient Vitals for the past 24 hrs:  BP Temp Temp src Pulse Resp SpO2  07/12/16 2122 138/62 - - 80 17 100 %  07/12/16 2008 141/73 - - 85 22 99 %  07/12/16 1719 139/82 98 F (36.7 C) Oral 91 20 100 %      10:42 PM-Consult complete with Hospitalist. Patient case explained and discussed. He agrees to admit patient for further evaluation and treatment. Call ended at 22:50     Final Clinical Impressions(s) / ED Diagnoses   1. Gastrointestinal bleeding  2. Supratherapeutic anticoagulation  Coumadin toxicity, with rectal  bleeding, likely proximal source with dark stool and elevated BUN. No comparison labs available, for BUN. She will require admission for observation, stabilization. She does not show evidence for hemodynamic instability at this time.  Nursing Notes Reviewed/ Care Coordinated, and agree without changes. Applicable Imaging Reviewed.  Interpretation of Laboratory Data incorporated into ED treatment  Plan: Admit  New Prescriptions New Prescriptions   No medications on file     Mancel BaleElliott An Schnabel, MD 07/12/16 [phone]

## 2016-07-12 NOTE — ED Notes (Signed)
CRITICAL VALUE ALERT  Critical value received:  INR 6.49  Date of notification:  07/12/16  Time of notification:  2237  Critical value read back:yes  Nurse who received alert: Kerrie Pleasurekelly Conard Alvira  MD notified (1st page):  Effie ShyWentz (face to face)

## 2016-07-12 NOTE — ED Notes (Signed)
Pt being sent by PCP.  C/o dizziness and dark stools x 4 days.  Facility reports Hgb 11.8, INR>8, and orthostatic changes.

## 2016-07-12 NOTE — ED Notes (Signed)
PT states diarrhea since Saturday, but has had "small, hard stools today". Also c/o feeling tired and weak.  Per report from doctor she saw just PTA, her PT was 8, hgb 11.8. Pt says her bp was also low when she saw her doctor today. PT denies seeing blood in her stool.

## 2016-07-13 ENCOUNTER — Encounter (HOSPITAL_COMMUNITY): Payer: Self-pay

## 2016-07-13 DIAGNOSIS — I10 Essential (primary) hypertension: Secondary | ICD-10-CM

## 2016-07-13 DIAGNOSIS — D62 Acute posthemorrhagic anemia: Secondary | ICD-10-CM

## 2016-07-13 DIAGNOSIS — I5032 Chronic diastolic (congestive) heart failure: Secondary | ICD-10-CM

## 2016-07-13 DIAGNOSIS — K922 Gastrointestinal hemorrhage, unspecified: Secondary | ICD-10-CM

## 2016-07-13 DIAGNOSIS — K921 Melena: Secondary | ICD-10-CM

## 2016-07-13 DIAGNOSIS — Z954 Presence of other heart-valve replacement: Secondary | ICD-10-CM

## 2016-07-13 LAB — CBC
HCT: 30.7 % — ABNORMAL LOW (ref 36.0–46.0)
Hemoglobin: 9.8 g/dL — ABNORMAL LOW (ref 12.0–15.0)
MCH: 28.1 pg (ref 26.0–34.0)
MCHC: 31.9 g/dL (ref 30.0–36.0)
MCV: 88 fL (ref 78.0–100.0)
Platelets: 255 10*3/uL (ref 150–400)
RBC: 3.49 MIL/uL — ABNORMAL LOW (ref 3.87–5.11)
RDW: 14.7 % (ref 11.5–15.5)
WBC: 9.1 10*3/uL (ref 4.0–10.5)

## 2016-07-13 LAB — FOLATE: Folate: 36.1 ng/mL (ref >5.9)

## 2016-07-13 LAB — HEMOGLOBIN AND HEMATOCRIT, BLOOD
HCT: 29.4 % — ABNORMAL LOW (ref 36.0–46.0)
Hemoglobin: 9.4 g/dL — ABNORMAL LOW (ref 12.0–15.0)

## 2016-07-13 LAB — IRON AND TIBC
Iron: 41 ug/dL (ref 28–170)
Saturation Ratios: 14 % (ref 10.4–31.8)
TIBC: 283 ug/dL (ref 250–450)
UIBC: 242 ug/dL

## 2016-07-13 LAB — PROTIME-INR
INR: 3.06
Prothrombin Time: 32.3 seconds — ABNORMAL HIGH (ref 11.4–15.2)

## 2016-07-13 LAB — FERRITIN: Ferritin: 19 ng/mL (ref 11–307)

## 2016-07-13 LAB — VITAMIN B12: Vitamin B-12: 753 pg/mL (ref 180–914)

## 2016-07-13 MED ORDER — SODIUM CHLORIDE 0.9 % IV SOLN: INTRAVENOUS | Status: DC

## 2016-07-13 MED ORDER — PANTOPRAZOLE SODIUM 40 MG PO TBEC
40.0000 mg | DELAYED_RELEASE_TABLET | Freq: Two times a day (BID) | ORAL | Status: DC
  Administered 2016-07-13 – 2016-07-17 (×8): 40 mg via ORAL
  Filled 2016-07-13 (×8): qty 1

## 2016-07-13 NOTE — Consult Note (Signed)
[facility] Consult Note  Referring Provider: No ref. provider found Primary Care Physician:  Reginia Naas, MD Primary Gastroenterologist:  Dr.  Laurel Dimmer Complaint: Black stools HPI: Tracy Vargas is an 82 y.o. white female  who was referred due to complaints of dark stools for 5 days with a known INR of 6.6. She states her stools were initially loose but now formed. She denies any abdominal pain nausea vomiting or rectal bleeding. She denies nonsteroidal anti-inflammatory drugs. She does not take iron or Pepto-Bismol.  Past Medical History:  Diagnosis Date  . Acquired hyperlipoproteinemia   . Chronic diastolic heart failure ([place]) 09/01/2013  . Diastolic dysfunction   . Dyspnea   . HTN (hypertension)   . Hyperlipidemia   . Osteopenia     Past Surgical History:  Procedure Laterality Date  . APPENDECTOMY    . CARDIAC VALVE REPLACEMENT    . CATARACT EXTRACTION    . ROTATOR CUFF REPAIR    . TONSILLECTOMY      Medications Prior to Admission  Medication Sig Dispense Refill  . alendronate (FOSAMAX) 70 MG tablet Take 70 mg by mouth every 7 (seven) days. Take with a full glass of water on an empty stomach. No specific day    . atorvastatin (LIPITOR) 10 MG tablet Take 10 mg by mouth daily at 6 PM.     . furosemide (LASIX) 20 MG tablet Take 20 mg by mouth every 7 (seven) days. Take once a week per patient . No specific days    . lisinopril (PRINIVIL,ZESTRIL) 20 MG tablet Take 20 mg by mouth daily.    Marland Kitchen warfarin (COUMADIN) 5 MG tablet Take 2.5-5 mg by mouth as directed. Patient states she takes a whole tablet (52m) some days then take a half tablet (2.594m other days per patient. Patient states she is not sure what days she takes different doses on.      Allergies:  Allergies  Allergen Reactions  . Codeine     Halluciations  . Sudafed [Pseudoephedrine Hcl]     Halluications    Family History  Problem Relation Age of Onset  . Heart failure Sister     Social  History:  reports that she has never smoked. She has never used smokeless tobacco. She reports that she does not drink alcohol. Her drug history is not on file.  Review of Systems: negative except as above   Blood pressure 123/66, pulse 71, temperature 98.3 F (36.8 C), temperature source Oral, resp. rate 18, height '5\' 1"'  (1.549 m), weight 72.6 kg (160 lb), SpO2 100 %. Head: Normocephalic, without obvious abnormality, atraumatic Neck: no adenopathy, no carotid bruit, no JVD, supple, symmetrical, trachea midline and thyroid not enlarged, symmetric, no tenderness/mass/nodules Resp: clear to auscultation bilaterally Cardio: regular rate and rhythm, S1, S2 normal, no murmur, click, rub or gallop GI: Abdomen soft nondistended with normoactive bowel sounds. No hepatomegaly masses or guarding Extremities: extremities normal, atraumatic, no cyanosis or edema  Results for orders placed or performed during the hospital encounter of 07/12/16 (from the past 48 hour(s))  Comprehensive metabolic panel     Status: Abnormal   Collection Time: 07/12/16  5:28 PM  Result Value Ref Range   Sodium 135 135 - 145 mmol/L   Potassium 4.3 3.5 - 5.1 mmol/L   Chloride 105 101 - 111 mmol/L   CO2 23 22 - 32 mmol/L   Glucose, Bld 104 (H) 65 - 99 mg/dL   BUN 39 (H) 6 - 20 mg/dL  Creatinine, Ser 0.85 0.44 - 1.00 mg/dL   Calcium 8.9 8.9 - 10.3 mg/dL   Total Protein 6.3 (L) 6.5 - 8.1 g/dL   Albumin 3.8 3.5 - 5.0 g/dL   AST 30 15 - 41 U/L   ALT 21 14 - 54 U/L   Alkaline Phosphatase 66 38 - 126 U/L   Total Bilirubin 0.2 (L) 0.3 - 1.2 mg/dL   GFR calc non Af Amer >60 >60 mL/min   GFR calc Af Amer >60 >60 mL/min    Comment: (NOTE) The eGFR has been calculated using the CKD EPI equation. This calculation has not been validated in all clinical situations. eGFR's persistently <60 mL/min signify possible Chronic Kidney Disease.    Anion gap 7 5 - 15  CBC     Status: Abnormal   Collection Time: 07/12/16  5:28 PM   Result Value Ref Range   WBC 9.0 4.0 - 10.5 K/uL   RBC 4.09 3.87 - 5.11 MIL/uL   Hemoglobin 11.4 (L) 12.0 - 15.0 g/dL   HCT 36.3 36.0 - 46.0 %   MCV 88.8 78.0 - 100.0 fL   MCH 27.9 26.0 - 34.0 pg   MCHC 31.4 30.0 - 36.0 g/dL   RDW 14.7 11.5 - 15.5 %   Platelets 314 150 - 400 K/uL  Protime-INR     Status: Abnormal   Collection Time: 07/12/16  9:02 PM  Result Value Ref Range   Prothrombin Time 59.0 (H) 11.4 - 15.2 seconds    Comment: REPEATED TO VERIFY   INR 6.49 (HH)     Comment: REPEATED TO VERIFY CRITICAL RESULT CALLED TO, READ BACK BY AND VERIFIED WITH: K.GIBSON,RN 2236 07/12/16 G.MCADOO   POC occult blood, ED     Status: Abnormal   Collection Time: 07/12/16  9:45 PM  Result Value Ref Range   Fecal Occult Bld POSITIVE (A) NEGATIVE  Protime-INR     Status: Abnormal   Collection Time: 07/13/16  7:41 AM  Result Value Ref Range   Prothrombin Time 32.3 (H) 11.4 - 15.2 seconds   INR 3.06   CBC     Status: Abnormal   Collection Time: 07/13/16  7:41 AM  Result Value Ref Range   WBC 9.1 4.0 - 10.5 K/uL   RBC 3.49 (L) 3.87 - 5.11 MIL/uL   Hemoglobin 9.8 (L) 12.0 - 15.0 g/dL   HCT 30.7 (L) 36.0 - 46.0 %   MCV 88.0 78.0 - 100.0 fL   MCH 28.1 26.0 - 34.0 pg   MCHC 31.9 30.0 - 36.0 g/dL   RDW 14.7 11.5 - 15.5 %   Platelets 255 150 - 400 K/uL   No results found.  Assessment: Black stools with elevated BUN slight drop in hemoglobin with an INR of 6 suggestive of upper GI bleeding Plan:  Empiric PPI EGD tomorrow. Rayburn Mundis C 07/13/2016, 10:12 AM  Pager [phone] If no answer or after 5 PM call [phone]

## 2016-07-13 NOTE — Progress Notes (Signed)
PROGRESS NOTE    Tracy Vargas  ZOX:[id number] DOB: 1934/10/22 DOA: 07/12/2016 PCP: Allean Found, MD    Brief Narrative:   Tracy Vargas is a 82 y.o. female with medical history significant of mechanical AVR, on chronic coumadin.  Patient presents to ED for evaluation of known INR of 6.6, 2 weeks ago at Dr. Lonn Georgia office and melanotic stools over the past 5 days.  She was seen earlier today at her PCPs office who noted repeat INR elevation further > 8 by their office labs today in office (would be 6.5 in ED today).  Due to development of 5 days of dark stools however they referred her to the ED today.  Symptoms onset 5 days ago, are persistent, quality of stool is dark in color.  Nothing makes symptoms worse, baking soda PO hasnt made symptoms better.  ED Course: Stool is dark, heme positive, but patient reports that unlike 2 days ago is hard (not soft).  HGB 11.4 (was 11.8 in office at 4pm today).    Assessment & Plan:   Principal Problem:   GI bleed Active Problems:   HTN (hypertension)   Hyperlipidemia   Osteopenia   Chronic diastolic heart failure (HCC)   H/O mechanical aortic valve replacement   Acute blood loss anemia   #1 GI bleed Concern for upper GI bleed patient presented with melanotic stools in the setting of supratherapeutic INR of 6.6. Patient denies any NSAID use. Hemoglobin currently at 9.8 from 11.4 on admission. BUN elevated. Continue clear liquids. Continue PPI. Patient has been seen in consultation by gastroenterology and patient for upper endoscopy tomorrow.    #2 acute blood loss anemia Secondary to problem #1. Hemoglobin currently at 9.8 from 11.4. Follow H&H. Transfusion threshold hemoglobin less than 8.  #3 hypertension Stable. Blood pressure is borderline. Discontinue lisinopril.  #4 hyperlipidemia Continue statin.  #5 chronic diastolic heart failure Stable.  #6 history of mechanical aortic valve replacement On Coumadin. INR on admission  was 6.49. Patient given vitamin K 2.5 mg 1 INR currently at 3.06. Follow. If INR becomes subtherapeutic may consider placing on IV heparin if okay with GI. Follow for now.    DVT prophylaxis: SCDs Code Status: Full Family Communication: Updated patient. No family present. Disposition Plan:   Consultants:   Gastroenterology: Dr. Madilyn Fireman 07/13/2016  Procedures:   None  Antimicrobials:  None   Subjective: Patient states continue to have black tarry stools. No chest pain. No shortness of breath.  Objective: Vitals:   07/12/16 2230 07/13/16 0052 07/13/16 0403 07/13/16 1410  BP: 111/59 (!) 159/69 123/66 105/60  Pulse: 82 76 71 70  Resp: 17 16 18 17   Temp:  98.5 F (36.9 C) 98.3 F (36.8 C) 97.8 F (36.6 C)  TempSrc:  Oral Oral Oral  SpO2: 99% 100% 100% 97%  Weight:  72.6 kg (160 lb)    Height:  5\' 1"  (1.549 m)      Intake/Output Summary (Last 24 hours) at 07/13/16 1619 Last data filed at 07/13/16 0146  Gross per 24 hour  Intake               50 ml  Output              400 ml  Net             -350 ml   Filed Weights   07/13/16 0052  Weight: 72.6 kg (160 lb)    Examination:  General exam: Appears calm and  comfortable  Respiratory system: Clear to auscultation. Respiratory effort normal. Cardiovascular system: S1 & S2 heard, RRR. No JVD, murmurs, rubs, gallops or clicks. No pedal edema. Gastrointestinal system: Abdomen is nondistended, soft and nontender. No organomegaly or masses felt. Normal bowel sounds heard. Central nervous system: Alert and oriented. No focal neurological deficits. Extremities: Symmetric 5 x 5 power. Skin: No rashes, lesions or ulcers Psychiatry: Judgement and insight appear normal. Mood & affect appropriate.     Data Reviewed: I have personally reviewed following labs and imaging studies  CBC:  Recent Labs Lab 07/12/16 1728 07/13/16 0741  WBC 9.0 9.1  HGB 11.4* 9.8*  HCT 36.3 30.7*  MCV 88.8 88.0  PLT 314 255   Basic  Metabolic Panel:  Recent Labs Lab 07/12/16 1728  NA 135  K 4.3  CL 105  CO2 23  GLUCOSE 104*  BUN 39*  CREATININE 0.85  CALCIUM 8.9   GFR: Estimated Creatinine Clearance: 48.1 mL/min (by C-G formula based on SCr of 0.85 mg/dL). Liver Function Tests:  Recent Labs Lab 07/12/16 1728  AST 30  ALT 21  ALKPHOS 66  BILITOT 0.2*  PROT 6.3*  ALBUMIN 3.8   No results for input(s): LIPASE, AMYLASE in the last 168 hours. No results for input(s): AMMONIA in the last 168 hours. Coagulation Profile:  Recent Labs Lab 07/12/16 2102 07/13/16 0741  INR 6.49* 3.06   Cardiac Enzymes: No results for input(s): CKTOTAL, CKMB, CKMBINDEX, TROPONINI in the last 168 hours. BNP (last 3 results) No results for input(s): PROBNP in the last 8760 hours. HbA1C: No results for input(s): HGBA1C in the last 72 hours. CBG: No results for input(s): GLUCAP in the last 168 hours. Lipid Profile: No results for input(s): CHOL, HDL, LDLCALC, TRIG, CHOLHDL, LDLDIRECT in the last 72 hours. Thyroid Function Tests: No results for input(s): TSH, T4TOTAL, FREET4, T3FREE, THYROIDAB in the last 72 hours. Anemia Panel:  Recent Labs  07/13/16 0933  VITAMINB12 753  FOLATE 36.1  FERRITIN 19  TIBC 283  IRON 41   Sepsis Labs: No results for input(s): PROCALCITON, LATICACIDVEN in the last 168 hours.  No results found for this or any previous visit (from the past 240 hour(s)).       Radiology Studies: No results found.      Scheduled Meds: . atorvastatin  10 mg Oral q1800  . lisinopril  20 mg Oral Daily  . pantoprazole  40 mg Oral BID   Continuous Infusions:    LOS: 1 day    Time spent: 35 minutes    [name], MD [facility] Pager [phone]  If 7PM-7AM, please contact night-coverage [email or web address] Password [email or web address] 07/13/2016, 4:19 PM

## 2016-07-14 ENCOUNTER — Inpatient Hospital Stay (HOSPITAL_COMMUNITY): Payer: Commercial Managed Care - HMO | Admitting: Certified Registered Nurse Anesthetist

## 2016-07-14 ENCOUNTER — Encounter (HOSPITAL_COMMUNITY): Payer: Self-pay | Admitting: *Deleted

## 2016-07-14 ENCOUNTER — Encounter (HOSPITAL_COMMUNITY)
Admission: EM | Disposition: A | Discharge status: Home / Self Care | Payer: Self-pay | Source: Home / Self Care | Attending: Internal Medicine

## 2016-07-14 DIAGNOSIS — E785 Hyperlipidemia, unspecified: Secondary | ICD-10-CM

## 2016-07-14 DIAGNOSIS — K922 Gastrointestinal hemorrhage, unspecified: Secondary | ICD-10-CM

## 2016-07-14 HISTORY — PX: ESOPHAGOGASTRODUODENOSCOPY: SHX5428

## 2016-07-14 LAB — CBC
HCT: 31 % — ABNORMAL LOW (ref 36.0–46.0)
Hemoglobin: 9.7 g/dL — ABNORMAL LOW (ref 12.0–15.0)
MCH: 27.6 pg (ref 26.0–34.0)
MCHC: 31.3 g/dL (ref 30.0–36.0)
MCV: 88.3 fL (ref 78.0–100.0)
Platelets: 264 10*3/uL (ref 150–400)
RBC: 3.51 MIL/uL — ABNORMAL LOW (ref 3.87–5.11)
RDW: 14.8 % (ref 11.5–15.5)
WBC: 8.4 10*3/uL (ref 4.0–10.5)

## 2016-07-14 LAB — BASIC METABOLIC PANEL
Anion gap: 6 (ref 5–15)
BUN: 13 mg/dL (ref 6–20)
CO2: 22 mmol/L (ref 22–32)
Calcium: 8.6 mg/dL — ABNORMAL LOW (ref 8.9–10.3)
Chloride: 108 mmol/L (ref 101–111)
Creatinine, Ser: 0.84 mg/dL (ref 0.44–1.00)
GFR calc Af Amer: >60 mL/min (ref >60)
GFR calc non Af Amer: >60 mL/min (ref >60)
Glucose, Bld: 108 mg/dL — ABNORMAL HIGH (ref 65–99)
Potassium: 4.5 mmol/L (ref 3.5–5.1)
Sodium: 136 mmol/L (ref 135–145)

## 2016-07-14 LAB — PROTIME-INR
INR: 1.71
Prothrombin Time: 20.3 seconds — ABNORMAL HIGH (ref 11.4–15.2)

## 2016-07-14 LAB — HEPARIN LEVEL (UNFRACTIONATED): Heparin Unfractionated: 0.36 IU/mL (ref 0.30–0.70)

## 2016-07-14 SURGERY — EGD (ESOPHAGOGASTRODUODENOSCOPY)
Anesthesia: Monitor Anesthesia Care

## 2016-07-14 MED ORDER — WARFARIN - PHARMACIST DOSING INPATIENT
Freq: Every day | Status: DC
  Administered 2016-07-15: 18:00:00
  Administered 2016-07-16: 1

## 2016-07-14 MED ORDER — SODIUM CHLORIDE 0.9 % IV SOLN: INTRAVENOUS | Status: DC

## 2016-07-14 MED ORDER — HEPARIN (PORCINE) IN NACL 100-0.45 UNIT/ML-% IJ SOLN
650.0000 [IU]/h | INTRAMUSCULAR | Status: DC
  Administered 2016-07-14 – 2016-07-16 (×2): 750 [IU]/h via INTRAVENOUS
  Filled 2016-07-14 (×2): qty 250

## 2016-07-14 MED ORDER — PROPOFOL 10 MG/ML IV BOLUS
INTRAVENOUS | Status: DC | PRN
  Administered 2016-07-14 (×2): 10 mg via INTRAVENOUS

## 2016-07-14 MED ORDER — PANTOPRAZOLE SODIUM 40 MG PO TBEC: 40.0000 mg | DELAYED_RELEASE_TABLET | Freq: Two times a day (BID) | ORAL | Status: DC

## 2016-07-14 MED ORDER — LACTATED RINGERS IV SOLN
INTRAVENOUS | Status: DC | PRN
  Administered 2016-07-14: 08:00:00 via INTRAVENOUS

## 2016-07-14 MED ORDER — WARFARIN SODIUM 2.5 MG PO TABS
2.5000 mg | ORAL_TABLET | Freq: Once | ORAL | Status: AC
  Administered 2016-07-14: 2.5 mg via ORAL
  Filled 2016-07-14: qty 1

## 2016-07-14 MED ORDER — PROPOFOL 500 MG/50ML IV EMUL
INTRAVENOUS | Status: DC | PRN
  Administered 2016-07-14: 75 ug/kg/min via INTRAVENOUS

## 2016-07-14 MED ORDER — LIDOCAINE HCL (CARDIAC) 20 MG/ML IV SOLN
INTRAVENOUS | Status: DC | PRN
  Administered 2016-07-14: 30 mg via INTRATRACHEAL

## 2016-07-14 NOTE — Progress Notes (Signed)
ANTICOAGULATION CONSULT NOTE   Pharmacy Consult for Heparin and Coumadin Indication: mechanical AVR  Allergies  Allergen Reactions  . Codeine     Halluciations  . Sudafed [Pseudoephedrine Hcl]     Halluications    Patient Measurements: Height: 5\' 1"  (154.9 cm) Weight: 160 lb (72.6 kg) IBW/kg (Calculated) : 47.8 Heparin Dosing Weight: 64 kg  Vital Signs: Temp: 98.7 F (37.1 C) (09/08 2000) Temp Source: Oral (09/08 2000) BP: 139/58 (09/08 2000) Pulse Rate: 71 (09/08 2000)  Labs:  Recent Labs  07/12/16 1728 07/12/16 2102 07/13/16 0741 07/13/16 1653 07/14/16 0435 07/14/16 0950 07/14/16 2155  HGB 11.4*  --  9.8* 9.4* 9.7*  --   --   HCT 36.3  --  30.7* 29.4* 31.0*  --   --   PLT 314  --  255  --  264  --   --   LABPROT  --  59.0* 32.3*  --   --  20.3*  --   INR  --  6.49* 3.06  --   --  1.71  --   HEPARINUNFRC  --   --   --   --   --   --  0.36  CREATININE 0.85  --   --   --  0.84  --   --     Estimated Creatinine Clearance: 48.7 mL/min (by C-G formula based on SCr of 0.84 mg/dL).   Medical History: Past Medical History:  Diagnosis Date  . Acquired hyperlipoproteinemia   . Chronic diastolic heart failure (HCC) 09/01/2013  . Diastolic dysfunction   . Dyspnea   . HTN (hypertension)   . Hyperlipidemia   . Osteopenia    Assessment: 82 yr old female admitted 07/12/16 with melanotic stools and supratherapeutic INR (6.49). On Coumadin for hx mechanical AVR.  Received Vitamin K 2.5 mg IV on 9/7 ~2am.  INR down to 1.71 today. S/p EGD 9/8 and ok to resume anticoagulation.    Initial heparin level therapeutic on heparin infusion at 750 units/hr. No reported bleeding.   Patient reports home Coumadin regimen of 5 mg alternating with 7.5 mg daily, though she had been on 5 mg daily for many years.   Goal of Therapy:  Heparin level 0.3-0.5 units/ml  INR 2-2.5 Monitor platelets by anticoagulation protocol: Yes   Plan:  1. Continue Heparin at 750 units/hr (~12  units/kg/hr) 2. Heparin level in am as confirmatory  3. Low therapeutic goals for both Heparin and Coumadin 4. Will follow up for any s/sx bleeding  Pollyann SamplesAndy Keeton Kassebaum, PharmD, BCPS 07/14/2016, 10:36 PM Pager: [phone]

## 2016-07-14 NOTE — Care Management Important Message (Signed)
Important Message  Patient Details  Name: Tracy Vargas MRN: [id number] Date of Birth: 05-26-1934   Medicare Important Message Given:  Yes    Tymel Conely Abena 07/14/2016, 11:23 AM

## 2016-07-14 NOTE — Anesthesia Procedure Notes (Signed)
Procedure Name: MAC Performed by: Tanishka Drolet LEFFEW Pre-anesthesia Checklist: Patient identified, Emergency Drugs available, Suction available, Timeout performed and Patient being monitored Patient Re-evaluated:Patient Re-evaluated prior to inductionOxygen Delivery Method: Nasal cannula Placement Confirmation: positive ETCO2     

## 2016-07-14 NOTE — Transfer of Care (Signed)
Immediate Anesthesia Transfer of Care Note  Patient: Tracy AlarDoris E Buchberger  Procedure(s) Performed: Procedure(s): ESOPHAGOGASTRODUODENOSCOPY (EGD) (N/A)  Patient Location: PACU and Endoscopy Unit  Anesthesia Type:MAC  Level of Consciousness: awake, alert , oriented, patient cooperative and responds to stimulation  Airway & Oxygen Therapy: Patient Spontanous Breathing and Patient connected to nasal cannula oxygen  Post-op Assessment: Report given to RN, Post -op Vital signs reviewed and stable and Patient moving all extremities X 4  Post vital signs: Reviewed and stable  Last Vitals:  Vitals:   07/14/16 0711 07/14/16 0839  BP: (!) 162/48 (!) 103/44  Pulse: 64 66  Resp: 12 18  Temp: 36.8 C 36.4 C    Last Pain:  Vitals:   07/14/16 0839  TempSrc: Oral         Complications: No apparent anesthesia complications

## 2016-07-14 NOTE — Op Note (Signed)
[facility] Patient Name: Tracy Vargas Procedure Date : 07/14/2016 MRN: [id number] Attending MD: Barrie Folk , MD Date of Birth: Apr 30, 1934 CSN: [id number] Age: 82 Admit Type: Inpatient Procedure:                Upper GI endoscopy Indications:              Melena Providers:                Everardo All. Madilyn Fireman, MD, Dwain Sarna, RN, Janie                            Billups, Technician, Virgel Gess, CRNA Referring MD:              Medicines:                Propofol per Anesthesia Complications:            No immediate complications. Estimated Blood Loss:     Estimated blood loss: none. Procedure:                Pre-Anesthesia Assessment:                           - Prior to the procedure, a History and Physical                            was performed, and patient medications and                            allergies were reviewed. The patient's tolerance of                            previous anesthesia was also reviewed. The risks                            and benefits of the procedure and the sedation                            options and risks were discussed with the patient.                            All questions were answered, and informed consent                            was obtained. Prior Anticoagulants: The patient has                            taken Coumadin (warfarin), last dose was 3 days                            prior to procedure. ASA Grade Assessment: III - A                            patient with severe systemic disease. After  reviewing the risks and benefits, the patient was                            deemed in satisfactory condition to undergo the                            procedure.                           After obtaining informed consent, the endoscope was                            passed under direct vision. Throughout the                            procedure, the patient's blood pressure, pulse, and               oxygen saturations were monitored continuously. The                            [id number]-[id number] ([id number]) scope was introduced through the                            mouth, and advanced to the second part of duodenum.                            The upper GI endoscopy was accomplished without                            difficulty. The patient tolerated the procedure                            well. Scope In: Scope Out: Findings:      A 10 cm hiatal hernia with a few Cameron ulcers was found. The hiatal       narrowing was 30 cm from the incisors.      The entire examined stomach was normal.      The examined duodenum was normal. Impression:               - 10 cm hiatal hernia with a few Cameron ulcers.                           - Normal stomach.                           - Normal examined duodenum.                           - No specimens collected. Moderate Sedation:      no moderate sedation Recommendation:           - Patient has a contact number available for                            emergencies. The signs and symptoms of potential  delayed complications were discussed with the                            patient. Return to normal activities tomorrow.                            Written discharge instructions were provided to the                            patient.                           - Resume previous diet.                           - Continue present medications.                           - Use a proton pump inhibitor PO BID. Procedure Code(s):        --- Professional ---                           [phone], Esophagogastroduodenoscopy, flexible,                            transoral; diagnostic, including collection of                            specimen(s) by brushing or washing, when performed                            (separate procedure) Diagnosis Code(s):        --- Professional ---                           K44.9, Diaphragmatic hernia  without obstruction or                            gangrene                           K25.9, Gastric ulcer, unspecified as acute or                            chronic, without hemorrhage or perforation                           K92.1, Melena (includes Hematochezia) CPT copyright 2016 [employer]. All rights reserved. The codes documented in this report are preliminary and upon coder review may  be revised to meet current compliance requirements. Barrie FolkJohn C Kamiryn Bezanson, MD 07/14/2016 8:43:45 AM This report has been signed electronically. Number of Addenda: 0

## 2016-07-14 NOTE — Progress Notes (Signed)
ANTICOAGULATION CONSULT NOTE - Initial Consult  Pharmacy Consult for Heparin and Coumadin Indication: mechanical AVR  Allergies  Allergen Reactions  . Codeine     Halluciations  . Sudafed [Pseudoephedrine Hcl]     Halluications    Patient Measurements: Height: 5\' 1"  (154.9 cm) Weight: 160 lb (72.6 kg) IBW/kg (Calculated) : 47.8 Heparin Dosing Weight: 64 kg  Vital Signs: Temp: 97.7 F (36.5 C) (09/08 1352) Temp Source: Oral (09/08 1352) BP: 134/64 (09/08 1352) Pulse Rate: 71 (09/08 1352)  Labs:  Recent Labs  07/12/16 1728 07/12/16 2102 07/13/16 0741 07/13/16 1653 07/14/16 0435 07/14/16 0950  HGB 11.4*  --  9.8* 9.4* 9.7*  --   HCT 36.3  --  30.7* 29.4* 31.0*  --   PLT 314  --  255  --  264  --   LABPROT  --  59.0* 32.3*  --   --  20.3*  INR  --  6.49* 3.06  --   --  1.71  CREATININE 0.85  --   --   --  0.84  --     Estimated Creatinine Clearance: 48.7 mL/min (by C-G formula based on SCr of 0.84 mg/dL).   Medical History: Past Medical History:  Diagnosis Date  . Acquired hyperlipoproteinemia   . Chronic diastolic heart failure (HCC) 09/01/2013  . Diastolic dysfunction   . Dyspnea   . HTN (hypertension)   . Hyperlipidemia   . Osteopenia    Assessment:   82 yr old female admitted 07/12/16 with melanotic stools and supratherapeutic INR (6.49). On Coumadin for hx mechanical AVR.  Received Vitamin K 2.5 mg IV on 9/7 ~2am.  INR down to 1.71 today.   S/p EGD today, large hiatal hernia with mild erosions noted. Planning PPI BID indefinitely. Patient reports stools are still dark, but no blood noted.    OK to resume anticoagulation per GI, with lowest acceptable INR -> goal of 2-2.5.  For heparin bridge. No bolus. Will use lower goal for heparin dosing as well.       Patient reports home Coumadin regimen of 5 mg alternating with 7.5 mg daily, though she had been on 5 mg daily for many years. She mentioned that INR was previously high, so wonder if intent was to  take 1/2 of a 5 mg tablet some days, but she took 1.5 tablets?  On Coumadin since AVR in 1999.  Last Coumadin dose at home on 9/6 am.  Goal of Therapy:  Heparin level 0.3-0.5 units/ml  INR 2-2.5 Monitor platelets by anticoagulation protocol: Yes   Plan:    Begin Heparin at 750 units/hr (~12 units/kg/hr).  Patient requested new IV site before beginning infusion, as current IV in left hand is uncomfortable. RN aware.   Heparin level ~6 hrs after drip begins.   Coumadin 2.5 mg today. Conservative dose.   Daily heparin level, PT/INR and CBC.   Low therapeutic goals for both Heparin and Coumadin.   Will follow up for any s/sx bleeding. Patient will report if any further blood in stool.  Dennie Fettersgan, Derek Laughter Donovan, [place] Pager: [phone] 07/14/2016,3:43 PM

## 2016-07-14 NOTE — Discharge Instructions (Signed)

## 2016-07-14 NOTE — Progress Notes (Signed)
GI: EGD done, shows a very large hiatal hernia with mild erosions, suspect source of GI blood loss. Recommend double dose PPI indefinitely and keep INR at lowest acceptable level. We'll sign off for now.

## 2016-07-14 NOTE — Progress Notes (Addendum)
PROGRESS NOTE    Tracy AlarDoris E More  ZOX:[id number] DOB: Jul 22, 1934 DOA: 07/12/2016 PCP: Allean FoundSMITH,CANDACE THIELE, MD    Brief Narrative:   Tracy Vargas is a 82 y.o. female with medical history significant of mechanical AVR, on chronic coumadin.  Patient presents to ED for evaluation of known INR of 6.6, 2 weeks ago at Dr. Lonn GeorgiaSmiths office and melanotic stools over the past 5 days.  She was seen earlier today at her PCPs office who noted repeat INR elevation further > 8 by their office labs today in office (would be 6.5 in ED today).  Due to development of 5 days of dark stools however they referred her to the ED today.  Symptoms onset 5 days ago, are persistent, quality of stool is dark in color.  Nothing makes symptoms worse, baking soda PO hasnt made symptoms better.  ED Course: Stool is dark, heme positive, but patient reports that unlike 2 days ago is hard (not soft).  HGB 11.4 (was 11.8 in office at 4pm today).    Assessment & Plan:   Principal Problem:   GI bleed Active Problems:   HTN (hypertension)   Hyperlipidemia   Osteopenia   Chronic diastolic heart failure (HCC)   H/O mechanical aortic valve replacement   Acute blood loss anemia   #1 GI bleed/large hatial hernia with mild erosions Concern for upper GI bleed patient presented with melanotic stools in the setting of supratherapeutic INR of 6.6. Patient denied any NSAID use. Hemoglobin currently at 9.7 from 9.8 from 11.4 on admission. BUN elevated.  Patient status post upper endoscopy 07/14/2016 with a 10 cm hiatal hernia with few Cameron ulcers. Continue twice a day PPI indefinitely per GI recommendations. GI also recommended to keep goal INR on the lower end of therapeutic goal for aortic valve replacement. Follow H&H. Per GI and appreciate input and recommendations.   #2 acute blood loss anemia Secondary to problem #1. Hemoglobin currently at 9.7 from 9.8 from 11.4. Follow H&H. Transfusion threshold hemoglobin less than 8.  #3  hypertension Stable. Blood pressure was borderline and a such lisinopril was discontinued. Blood pressure gets high will resume home regimen of lisinopril.  #4 hyperlipidemia Continue statin.  #5 chronic diastolic heart failure Stable.  #6 history of mechanical aortic valve replacement On Coumadin. INR on admission was 6.49. Patient given vitamin K 2.5 mg 1 INR currently at 1.71. Per GI  keep INR at lowest acceptable level. As INR is subtherapeutic we'll place on IV heparin bridge for now and resume Coumadin with goal INR 2-2.5.     DVT prophylaxis: IV heparin Code Status: Full Family Communication: Updated patient. No family present. Disposition Plan: Home once INR is therapeutic on the low end of goal and no further bleeding.    Consultants:   Gastroenterology: Dr. Madilyn FiremanHayes 07/13/2016  Procedures:   Upper endoscopy 07/14/2016 per Dr. Charlene BrookeHayes----10 cm hiatal hernia with few Sheria Langameron ulcers.  Antimicrobials:  None   Subjective: Patient denies any black tarry stools. No chest and. No shortness of breath. Eating lunch.   Objective: Vitals:   07/14/16 0711 07/14/16 0839 07/14/16 0840 07/14/16 0850  BP: (!) 162/48 (!) 103/44 (!) 103/44 (!) 102/54  Pulse: 64 66 67 61  Resp: 12 18 (!) 21 12  Temp: 98.2 F (36.8 C) 97.5 F (36.4 C)    TempSrc: Oral Oral    SpO2: 100% 99% 99% 98%  Weight:      Height:        Intake/Output Summary (  Last 24 hours) at 07/14/16 1224 Last data filed at 07/14/16 1105  Gross per 24 hour  Intake              100 ml  Output             1200 ml  Net            -1100 ml   Filed Weights   07/13/16 0052  Weight: 72.6 kg (160 lb)    Examination:  General exam: Appears calm and comfortable  Respiratory system: Clear to auscultation. Respiratory effort normal. Cardiovascular system: S1 & S2 heard, RRR. No JVD, murmurs, rubs, gallops.Crisp click noted of aortic valve replacement. No pedal edema. Gastrointestinal system: Abdomen is nondistended,  soft and nontender. No organomegaly or masses felt. Normal bowel sounds heard. Central nervous system: Alert and oriented. No focal neurological deficits. Extremities: Symmetric 5 x 5 power. Skin: No rashes, lesions or ulcers Psychiatry: Judgement and insight appear normal. Mood & affect appropriate.     Data Reviewed: I have personally reviewed following labs and imaging studies  CBC:  Recent Labs Lab 07/12/16 1728 07/13/16 0741 07/13/16 1653 07/14/16 0435  WBC 9.0 9.1  --  8.4  HGB 11.4* 9.8* 9.4* 9.7*  HCT 36.3 30.7* 29.4* 31.0*  MCV 88.8 88.0  --  88.3  PLT 314 255  --  264   Basic Metabolic Panel:  Recent Labs Lab 07/12/16 1728 07/14/16 0435  NA 135 136  K 4.3 4.5  CL 105 108  CO2 23 22  GLUCOSE 104* 108*  BUN 39* 13  CREATININE 0.85 0.84  CALCIUM 8.9 8.6*   GFR: Estimated Creatinine Clearance: 48.7 mL/min (by C-G formula based on SCr of 0.84 mg/dL). Liver Function Tests:  Recent Labs Lab 07/12/16 1728  AST 30  ALT 21  ALKPHOS 66  BILITOT 0.2*  PROT 6.3*  ALBUMIN 3.8   No results for input(s): LIPASE, AMYLASE in the last 168 hours. No results for input(s): AMMONIA in the last 168 hours. Coagulation Profile:  Recent Labs Lab 07/12/16 2102 07/13/16 0741 07/14/16 0950  INR 6.49* 3.06 1.71   Cardiac Enzymes: No results for input(s): CKTOTAL, CKMB, CKMBINDEX, TROPONINI in the last 168 hours. BNP (last 3 results) No results for input(s): PROBNP in the last 8760 hours. HbA1C: No results for input(s): HGBA1C in the last 72 hours. CBG: No results for input(s): GLUCAP in the last 168 hours. Lipid Profile: No results for input(s): CHOL, HDL, LDLCALC, TRIG, CHOLHDL, LDLDIRECT in the last 72 hours. Thyroid Function Tests: No results for input(s): TSH, T4TOTAL, FREET4, T3FREE, THYROIDAB in the last 72 hours. Anemia Panel:  Recent Labs  07/13/16 0933  VITAMINB12 753  FOLATE 36.1  FERRITIN 19  TIBC 283  IRON 41   Sepsis Labs: No results for  input(s): PROCALCITON, LATICACIDVEN in the last 168 hours.  No results found for this or any previous visit (from the past 240 hour(s)).       Radiology Studies: No results found.      Scheduled Meds: . atorvastatin  10 mg Oral q1800  . pantoprazole  40 mg Oral BID   Continuous Infusions:    LOS: 2 days    Time spent: 35 minutes    Luann Aspinwall, MD [facility] Pager [phone]  If 7PM-7AM, please contact night-coverage [email or web address] Password [facility] 07/14/2016, 12:24 PM

## 2016-07-15 LAB — PROTIME-INR
INR: 1.77
Prothrombin Time: 20.9 seconds — ABNORMAL HIGH (ref 11.4–15.2)

## 2016-07-15 LAB — BASIC METABOLIC PANEL
Anion gap: 5 (ref 5–15)
BUN: 16 mg/dL (ref 6–20)
CO2: 24 mmol/L (ref 22–32)
Calcium: 8.5 mg/dL — ABNORMAL LOW (ref 8.9–10.3)
Chloride: 109 mmol/L (ref 101–111)
Creatinine, Ser: 0.81 mg/dL (ref 0.44–1.00)
GFR calc Af Amer: >60 mL/min (ref >60)
GFR calc non Af Amer: >60 mL/min (ref >60)
Glucose, Bld: 97 mg/dL (ref 65–99)
Potassium: 4.1 mmol/L (ref 3.5–5.1)
Sodium: 138 mmol/L (ref 135–145)

## 2016-07-15 LAB — HEPARIN LEVEL (UNFRACTIONATED): Heparin Unfractionated: 0.36 IU/mL (ref 0.30–0.70)

## 2016-07-15 MED ORDER — WARFARIN SODIUM 2.5 MG PO TABS
2.5000 mg | ORAL_TABLET | Freq: Once | ORAL | Status: AC
  Administered 2016-07-15: 2.5 mg via ORAL
  Filled 2016-07-15: qty 1

## 2016-07-15 MED ORDER — SENNOSIDES-DOCUSATE SODIUM 8.6-50 MG PO TABS
1.0000 | ORAL_TABLET | Freq: Two times a day (BID) | ORAL | Status: DC
  Administered 2016-07-15 – 2016-07-16 (×3): 1 via ORAL
  Filled 2016-07-15 (×4): qty 1

## 2016-07-15 NOTE — Progress Notes (Signed)
PROGRESS NOTE    Tracy AlarDoris E Kirn  [id number] DOB: [birth year] DOA: 07/12/2016 PCP: Allean FoundSMITH,CANDACE THIELE, MD    Brief Narrative:   Tracy Vargas is a 82 y.o. female with medical history significant of mechanical AVR, on chronic coumadin.  Patient presents to ED for evaluation of known INR of 6.6, 2 weeks ago at Dr. Lonn GeorgiaSmiths office and melanotic stools over the past 5 days.  She was seen earlier today at her PCPs office who noted repeat INR elevation further > 8 by their office labs today in office (would be 6.5 in ED today).  Due to development of 5 days of dark stools however they referred her to the ED today.  Symptoms onset 5 days ago, are persistent, quality of stool is dark in color.  Nothing makes symptoms worse, baking soda PO hasnt made symptoms better.  ED Course: Stool is dark, heme positive, but patient reports that unlike 2 days ago is hard (not soft).  HGB 11.4 (was 11.8 in office at 4pm today).    Assessment & Plan:   Principal Problem:   GI bleed Active Problems:   HTN (hypertension)   Hyperlipidemia   Osteopenia   Chronic diastolic heart failure (HCC)   H/O mechanical aortic valve replacement   Acute blood loss anemia   Bleeding gastrointestinal   #1 GI bleed/large hatial hernia with mild erosions Concern for upper GI bleed patient presented with melanotic stools in the setting of supratherapeutic INR of 6.6. Patient denied any NSAID use. Hemoglobin currently at 9.7 from 9.8 from 11.4 on admission. BUN elevated.  Patient status post upper endoscopy 07/14/2016 with a 10 cm hiatal hernia with few Cameron ulcers. Continue twice a day PPI indefinitely per GI recommendations. GI also recommended to keep goal INR on the lower end of therapeutic goal for aortic valve replacement. Follow H&H. Per GI and appreciate input and recommendations.   #2 acute blood loss anemia Secondary to problem #1. Hemoglobin currently at 9.7 from 9.8 from 11.4. Follow H&H. Transfusion threshold  hemoglobin less than 8.  #3 hypertension Stable. Blood pressure was borderline and as such lisinopril was discontinued. If blood pressure gets high will resume home regimen of lisinopril.  #4 hyperlipidemia Continue statin.  #5 chronic diastolic heart failure Stable.  #6 history of mechanical aortic valve replacement On Coumadin. INR on admission was 6.49. Patient given vitamin K 2.5 mg 1 INR currently at 1.77. Per GI  keep INR at lowest acceptable level. Continue heparin bridge with Coumadin until INR is between 2-2.5.     DVT prophylaxis: IV heparin Code Status: Full Family Communication: Updated patient and husband at bedside. Disposition Plan: Home once INR is therapeutic on the low end of goal and no further bleeding.    Consultants:   Gastroenterology: Dr. Madilyn FiremanHayes 07/13/2016  Procedures:   Upper endoscopy 07/14/2016 per Dr. Charlene BrookeHayes----10 cm hiatal hernia with few Sheria Langameron ulcers.  Antimicrobials:  None   Subjective: Patient states still with some dark stools. No chest pain. No shortness of breath.   Objective: Vitals:   07/14/16 0850 07/14/16 1352 07/14/16 2000 07/15/16 0504  BP: (!) 102/54 134/64 (!) 139/58 (!) 138/58  Pulse: 61 71 71 64  Resp: 12 16 18 18   Temp:  97.7 F (36.5 C) 98.7 F (37.1 C) 97.5 F (36.4 C)  TempSrc:  Oral Oral Oral  SpO2: 98% 99% 99% 96%  Weight:      Height:       No intake or output data in  the 24 hours ending 07/15/16 1315 Filed Weights   07/13/16 0052  Weight: 72.6 kg (160 lb)    Examination:  General exam: Appears calm and comfortable  Respiratory system: Clear to auscultation. Respiratory effort normal. Cardiovascular system: S1 & S2 heard, RRR. No JVD, murmurs, rubs, gallops.Crisp click noted of aortic valve replacement. No pedal edema. Gastrointestinal system: Abdomen is nondistended, soft and nontender. No organomegaly or masses felt. Normal bowel sounds heard. Central nervous system: Alert and oriented. No focal  neurological deficits. Extremities: Symmetric 5 x 5 power. Skin: No rashes, lesions or ulcers Psychiatry: Judgement and insight appear normal. Mood & affect appropriate.     Data Reviewed: I have personally reviewed following labs and imaging studies  CBC:  Recent Labs Lab 07/12/16 1728 07/13/16 0741 07/13/16 1653 07/14/16 0435  WBC 9.0 9.1  --  8.4  HGB 11.4* 9.8* 9.4* 9.7*  HCT 36.3 30.7* 29.4* 31.0*  MCV 88.8 88.0  --  88.3  PLT 314 255  --  264   Basic Metabolic Panel:  Recent Labs Lab 07/12/16 1728 07/14/16 0435 07/15/16 0406  NA 135 136 138  K 4.3 4.5 4.1  CL 105 108 109  CO2 23 22 24   GLUCOSE 104* 108* 97  BUN 39* 13 16  CREATININE 0.85 0.84 0.81  CALCIUM 8.9 8.6* 8.5*   GFR: Estimated Creatinine Clearance: 50.5 mL/min (by C-G formula based on SCr of 0.81 mg/dL). Liver Function Tests:  Recent Labs Lab 07/12/16 1728  AST 30  ALT 21  ALKPHOS 66  BILITOT 0.2*  PROT 6.3*  ALBUMIN 3.8   No results for input(s): LIPASE, AMYLASE in the last 168 hours. No results for input(s): AMMONIA in the last 168 hours. Coagulation Profile:  Recent Labs Lab 07/12/16 2102 07/13/16 0741 07/14/16 0950 07/15/16 0406  INR 6.49* 3.06 1.71 1.77   Cardiac Enzymes: No results for input(s): CKTOTAL, CKMB, CKMBINDEX, TROPONINI in the last 168 hours. BNP (last 3 results) No results for input(s): PROBNP in the last 8760 hours. HbA1C: No results for input(s): HGBA1C in the last 72 hours. CBG: No results for input(s): GLUCAP in the last 168 hours. Lipid Profile: No results for input(s): CHOL, HDL, LDLCALC, TRIG, CHOLHDL, LDLDIRECT in the last 72 hours. Thyroid Function Tests: No results for input(s): TSH, T4TOTAL, FREET4, T3FREE, THYROIDAB in the last 72 hours. Anemia Panel:  Recent Labs  07/13/16 0933  VITAMINB12 753  FOLATE 36.1  FERRITIN 19  TIBC 283  IRON 41   Sepsis Labs: No results for input(s): PROCALCITON, LATICACIDVEN in the last 168 hours.  No  results found for this or any previous visit (from the past 240 hour(s)).       Radiology Studies: No results found.      Scheduled Meds: . atorvastatin  10 mg Oral q1800  . pantoprazole  40 mg Oral BID  . senna-docusate  1 tablet Oral BID  . warfarin  2.5 mg Oral ONCE-1800  . Warfarin - Pharmacist Dosing Inpatient   Does not apply q1800   Continuous Infusions: . heparin 750 Units/hr (07/14/16 1601)     LOS: 3 days    Time spent: 35 minutes    Zalyn Amend, MD [facility] Pager [phone]  If 7PM-7AM, please contact night-coverage [email or web address] Password [email or web address] 07/15/2016, 1:15 PM

## 2016-07-15 NOTE — Progress Notes (Signed)
ANTICOAGULATION CONSULT NOTE - Initial Consult  Pharmacy Consult for Heparin and Coumadin Indication: mechanical AVR  Allergies  Allergen Reactions  . Codeine     Halluciations  . Sudafed [Pseudoephedrine Hcl]     Halluications    Patient Measurements: Height: 5\' 1"  (154.9 cm) Weight: 160 lb (72.6 kg) IBW/kg (Calculated) : 47.8 Heparin Dosing Weight: 64 kg  Vital Signs: Temp: 97.5 F (36.4 C) (09/09 0504) Temp Source: Oral (09/09 0504) BP: 138/58 (09/09 0504) Pulse Rate: 64 (09/09 0504)  Labs:  Recent Labs  07/12/16 1728  07/13/16 0741 07/13/16 1653 07/14/16 0435 07/14/16 0950 07/14/16 2155 07/15/16 0406  HGB 11.4*  --  9.8* 9.4* 9.7*  --   --   --   HCT 36.3  --  30.7* 29.4* 31.0*  --   --   --   PLT 314  --  255  --  264  --   --   --   LABPROT  --   < > 32.3*  --   --  20.3*  --  20.9*  INR  --   < > 3.06  --   --  1.71  --  1.77  HEPARINUNFRC  --   --   --   --   --   --  0.36 0.36  CREATININE 0.85  --   --   --  0.84  --   --  0.81  < > = values in this interval not displayed.  Estimated Creatinine Clearance: 50.5 mL/min (by C-G formula based on SCr of 0.81 mg/dL).   Medical History: Past Medical History:  Diagnosis Date  . Acquired hyperlipoproteinemia   . Chronic diastolic heart failure (HCC) 09/01/2013  . Diastolic dysfunction   . Dyspnea   . HTN (hypertension)   . Hyperlipidemia   . Osteopenia    Assessment: 82 yr old female admitted 07/12/16 with melanotic stools and supratherapeutic INR (6.49). On Coumadin for hx mechanical AVR.  Received Vitamin K 2.5 mg IV on 9/7 ~2am.   S/p EGD 9/8, large hiatal hernia with mild erosions noted. Planning PPI BID indefinitely.  Heparin and warfarin resumed on 9/8, with plan to target lowest acceptable INR -> goal of 2-2.5 and heparin level of 0.3 - 0.5.   Patient reports home Coumadin regimen of 5 mg alternating with 7.5 mg daily, though she had been on 5 mg daily for many years.  Heparin level this morning  remains therapeutic at 0.36 on heparin infusion at 750 units/hr. INR slightly trended up to 1.77 < 1.71 following warfarin 2.5 mg dose last evening. No CBC obtained yet today but RN reports no bleeding.   Goal of Therapy:  Heparin level 0.3-0.5 units/ml  INR 2-2.5 Monitor platelets by anticoagulation protocol: Yes    Plan:  1. Continue heparin infusion at 750 units/hr 2. Coumadin 2.5 mg x 1 again tonight  3. Daily heparin level and INR 4. Ordered CBC for 9/10 am     Pollyann SamplesAndy Kiyana Vazguez, PharmD, BCPS 07/15/2016, 10:37 AM Pager: [phone]

## 2016-07-16 LAB — CBC
HCT: 28.4 % — ABNORMAL LOW (ref 36.0–46.0)
Hemoglobin: 9.1 g/dL — ABNORMAL LOW (ref 12.0–15.0)
MCH: 28.3 pg (ref 26.0–34.0)
MCHC: 32 g/dL (ref 30.0–36.0)
MCV: 88.5 fL (ref 78.0–100.0)
Platelets: 256 10*3/uL (ref 150–400)
RBC: 3.21 MIL/uL — ABNORMAL LOW (ref 3.87–5.11)
RDW: 14.8 % (ref 11.5–15.5)
WBC: 7.4 10*3/uL (ref 4.0–10.5)

## 2016-07-16 LAB — HEPARIN LEVEL (UNFRACTIONATED): Heparin Unfractionated: 0.62 IU/mL (ref 0.30–0.70)

## 2016-07-16 LAB — PROTIME-INR
INR: 1.93
Prothrombin Time: 22.3 seconds — ABNORMAL HIGH (ref 11.4–15.2)

## 2016-07-16 MED ORDER — WARFARIN SODIUM 2.5 MG PO TABS
2.5000 mg | ORAL_TABLET | Freq: Once | ORAL | Status: AC
  Administered 2016-07-16: 2.5 mg via ORAL
  Filled 2016-07-16: qty 1

## 2016-07-16 NOTE — Progress Notes (Signed)
PROGRESS NOTE    Tracy Vargas  ZOX:[id number] DOB: November 05, 1934 DOA: 07/12/2016 PCP: Allean Found, MD    Brief Narrative:   Tracy Vargas is a 82 y.o. female with medical history significant of mechanical AVR, on chronic coumadin.  Patient presents to ED for evaluation of known INR of 6.6, 2 weeks ago at Dr. Lonn Georgia office and melanotic stools over the past 5 days.  She was seen earlier today at her PCPs office who noted repeat INR elevation further > 8 by their office labs today in office (would be 6.5 in ED today).  Due to development of 5 days of dark stools however they referred her to the ED today.  Symptoms onset 5 days ago, are persistent, quality of stool is dark in color.  Nothing makes symptoms worse, baking soda PO hasnt made symptoms better.  ED Course: Stool is dark, heme positive, but patient reports that unlike 2 days ago is hard (not soft).  HGB 11.4 (was 11.8 in office at 4pm today).    Assessment & Plan:   Principal Problem:   GI bleed Active Problems:   HTN (hypertension)   Hyperlipidemia   Osteopenia   Chronic diastolic heart failure (HCC)   H/O mechanical aortic valve replacement   Acute blood loss anemia   Bleeding gastrointestinal   #1 GI bleed/large hatial hernia with mild erosions Concern for upper GI bleed patient presented with melanotic stools in the setting of supratherapeutic INR of 6.6. Patient denied any NSAID use. Hemoglobin currently at 9.1 from  9.7 from 9.8 from 11.4 on admission. BUN elevated.  Patient status post upper endoscopy 07/14/2016 with a 10 cm hiatal hernia with few Cameron ulcers. Continue twice a day PPI indefinitely per GI recommendations. GI also recommended to keep goal INR on the lower end of therapeutic goal for aortic valve replacement. Follow H&H. Per GI and appreciate input and recommendations.   #2 acute blood loss anemia Secondary to problem #1. Hemoglobin currently at 9.1 from 9.7 from 9.8 from 11.4. Follow H&H.  Transfusion threshold hemoglobin less than 8.  #3 hypertension Stable. Blood pressure was borderline and as such lisinopril was discontinued. If blood pressure gets high will resume home regimen of lisinopril.  #4 hyperlipidemia Continue statin.  #5 chronic diastolic heart failure Stable.  #6 history of mechanical aortic valve replacement On Coumadin. INR on admission was 6.49. Patient given vitamin K 2.5 mg 1 INR currently at 1.93. Per GI  keep INR at lowest acceptable level. Continue heparin bridge with Coumadin until INR is between 2-2.5.     DVT prophylaxis: IV heparin Code Status: Full Family Communication: Updated patient and husband at bedside. Disposition Plan: Home once INR is therapeutic on the low end of goal and no further bleeding.    Consultants:   Gastroenterology: Dr. Madilyn Fireman 07/13/2016  Procedures:   Upper endoscopy 07/14/2016 per Dr. Charlene Brooke cm hiatal hernia with few Sheria Lang ulcers.  Antimicrobials:  None   Subjective: No chest pain. No shortness of breath.   Objective: Vitals:   07/15/16 0504 07/15/16 1525 07/15/16 2029 07/16/16 0519  BP: (!) 138/58 (!) 124/43 (!) 148/57 (!) 139/56  Pulse: 64 71 79 62  Resp: 18 17 18 18   Temp: 97.5 F (36.4 C) 97.8 F (36.6 C) 97.6 F (36.4 C) 98.6 F (37 C)  TempSrc: Oral Oral Oral Oral  SpO2: 96% 96% 98% 97%  Weight:    72.4 kg (159 lb 11.2 oz)  Height:  Intake/Output Summary (Last 24 hours) at 07/16/16 1155 Last data filed at 07/16/16 0643  Gross per 24 hour  Intake              330 ml  Output             1800 ml  Net            -1470 ml   Filed Weights   07/13/16 0052 07/16/16 0519  Weight: 72.6 kg (160 lb) 72.4 kg (159 lb 11.2 oz)    Examination:  General exam: Appears calm and comfortable  Respiratory system: Clear to auscultation. Respiratory effort normal. Cardiovascular system: S1 & S2 heard, RRR. No JVD, murmurs, rubs, gallops.Crisp click noted of aortic valve replacement. No  pedal edema. Gastrointestinal system: Abdomen is nondistended, soft and nontender. No organomegaly or masses felt. Normal bowel sounds heard. Central nervous system: Alert and oriented. No focal neurological deficits. Extremities: Symmetric 5 x 5 power. Skin: No rashes, lesions or ulcers Psychiatry: Judgement and insight appear normal. Mood & affect appropriate.     Data Reviewed: I have personally reviewed following labs and imaging studies  CBC:  Recent Labs Lab 07/12/16 1728 07/13/16 0741 07/13/16 1653 07/14/16 0435 07/16/16 0506  WBC 9.0 9.1  --  8.4 7.4  HGB 11.4* 9.8* 9.4* 9.7* 9.1*  HCT 36.3 30.7* 29.4* 31.0* 28.4*  MCV 88.8 88.0  --  88.3 88.5  PLT 314 255  --  264 256   Basic Metabolic Panel:  Recent Labs Lab 07/12/16 1728 07/14/16 0435 07/15/16 0406  NA 135 136 138  K 4.3 4.5 4.1  CL 105 108 109  CO2 23 22 24   GLUCOSE 104* 108* 97  BUN 39* 13 16  CREATININE 0.85 0.84 0.81  CALCIUM 8.9 8.6* 8.5*   GFR: Estimated Creatinine Clearance: 50.4 mL/min (by C-G formula based on SCr of 0.81 mg/dL). Liver Function Tests:  Recent Labs Lab 07/12/16 1728  AST 30  ALT 21  ALKPHOS 66  BILITOT 0.2*  PROT 6.3*  ALBUMIN 3.8   No results for input(s): LIPASE, AMYLASE in the last 168 hours. No results for input(s): AMMONIA in the last 168 hours. Coagulation Profile:  Recent Labs Lab 07/12/16 2102 07/13/16 0741 07/14/16 0950 07/15/16 0406 07/16/16 0506  INR 6.49* 3.06 1.71 1.77 1.93   Cardiac Enzymes: No results for input(s): CKTOTAL, CKMB, CKMBINDEX, TROPONINI in the last 168 hours. BNP (last 3 results) No results for input(s): PROBNP in the last 8760 hours. HbA1C: No results for input(s): HGBA1C in the last 72 hours. CBG: No results for input(s): GLUCAP in the last 168 hours. Lipid Profile: No results for input(s): CHOL, HDL, LDLCALC, TRIG, CHOLHDL, LDLDIRECT in the last 72 hours. Thyroid Function Tests: No results for input(s): TSH, T4TOTAL,  FREET4, T3FREE, THYROIDAB in the last 72 hours. Anemia Panel: No results for input(s): VITAMINB12, FOLATE, FERRITIN, TIBC, IRON, RETICCTPCT in the last 72 hours. Sepsis Labs: No results for input(s): PROCALCITON, LATICACIDVEN in the last 168 hours.  No results found for this or any previous visit (from the past 240 hour(s)).       Radiology Studies: No results found.      Scheduled Meds: . atorvastatin  10 mg Oral q1800  . pantoprazole  40 mg Oral BID  . senna-docusate  1 tablet Oral BID  . warfarin  2.5 mg Oral ONCE-1800  . Warfarin - Pharmacist Dosing Inpatient   Does not apply q1800   Continuous Infusions: . heparin 650 Units/hr (  07/16/16 1146)     LOS: 4 days    Time spent: 35 minutes    THOMPSON,DANIEL, MD [facility] Pager [phone]  If 7PM-7AM, please contact night-coverage [email or web address] Password [email or web address] 07/16/2016, 11:55 AM

## 2016-07-16 NOTE — Progress Notes (Signed)
ANTICOAGULATION CONSULT NOTE   Pharmacy Consult for Heparin and warfarin  Indication: mechanical AVR  Allergies  Allergen Reactions  . Codeine     Halluciations  . Sudafed [Pseudoephedrine Hcl]     Halluications    Patient Measurements: Height: 5\' 1"  (154.9 cm) Weight: 159 lb 11.2 oz (72.4 kg) IBW/kg (Calculated) : 47.8 Heparin Dosing Weight: 64 kg  Vital Signs: Temp: 98.6 F (37 C) (09/10 0519) Temp Source: Oral (09/10 0519) BP: 139/56 (09/10 0519) Pulse Rate: 62 (09/10 0519)  Labs:  Recent Labs  07/13/16 1653 07/14/16 0435 07/14/16 0950 07/14/16 2155 07/15/16 0406 07/16/16 0506  HGB 9.4* 9.7*  --   --   --  9.1*  HCT 29.4* 31.0*  --   --   --  28.4*  PLT  --  264  --   --   --  256  LABPROT  --   --  20.3*  --  20.9* 22.3*  INR  --   --  1.71  --  1.77 1.93  HEPARINUNFRC  --   --   --  0.36 0.36 0.62  CREATININE  --  0.84  --   --  0.81  --     Estimated Creatinine Clearance: 50.4 mL/min (by C-G formula based on SCr of 0.81 mg/dL).   Medical History: Past Medical History:  Diagnosis Date  . Acquired hyperlipoproteinemia   . Chronic diastolic heart failure (HCC) 09/01/2013  . Diastolic dysfunction   . Dyspnea   . HTN (hypertension)   . Hyperlipidemia   . Osteopenia    Assessment: 82 yr old female admitted 07/12/16 with melanotic stools and supratherapeutic INR (6.49). On Coumadin for hx mechanical AVR.  Received Vitamin K 2.5 mg IV on 9/7 ~2am.   S/p EGD 9/8, large hiatal hernia with mild erosions noted. Planning PPI BID indefinitely.  Heparin and warfarin resumed on 9/8, with plan to target lowest acceptable INR -> goal of 2-2.5 and heparin level of 0.3 - 0.5.   Patient reports home Coumadin regimen of 5 mg alternating with 7.5 mg daily, though she had been on 5 mg daily for many years.  Heparin level of 0.62 is slightly above preferred range of 0.3 - 0.5.  INR up to 1.93 < 1.77 following warfarin 2.5 mg dose last two days.  CBC remains stable with no  reported bleeding.     Goal of Therapy:  Heparin level 0.3-0.5 units/ml  INR 2-2.5 Monitor platelets by anticoagulation protocol: Yes    Plan:  1. Slightly reduce heparin infusion to 650 units/hr 2. Repeat warfarin 2.5 mg x 1 this evening  3. Daily heparin level and INR   Pollyann SamplesAndy Clifton Kovacic, PharmD, BCPS 07/16/2016, 11:13 AM Pager: [phone]

## 2016-07-17 LAB — CBC
HCT: 29.4 % — ABNORMAL LOW (ref 36.0–46.0)
Hemoglobin: 9.2 g/dL — ABNORMAL LOW (ref 12.0–15.0)
MCH: 27.9 pg (ref 26.0–34.0)
MCHC: 31.3 g/dL (ref 30.0–36.0)
MCV: 89.1 fL (ref 78.0–100.0)
Platelets: 282 10*3/uL (ref 150–400)
RBC: 3.3 MIL/uL — ABNORMAL LOW (ref 3.87–5.11)
RDW: 14.8 % (ref 11.5–15.5)
WBC: 8.6 10*3/uL (ref 4.0–10.5)

## 2016-07-17 LAB — HEPARIN LEVEL (UNFRACTIONATED): Heparin Unfractionated: 0.35 IU/mL (ref 0.30–0.70)

## 2016-07-17 LAB — PROTIME-INR
INR: 2
Prothrombin Time: 23 seconds — ABNORMAL HIGH (ref 11.4–15.2)

## 2016-07-17 MED ORDER — WARFARIN SODIUM 5 MG PO TABS: 2.5000 mg | ORAL_TABLET | ORAL | Status: DC

## 2016-07-17 MED ORDER — SENNOSIDES-DOCUSATE SODIUM 8.6-50 MG PO TABS: 1.0000 | ORAL_TABLET | Freq: Two times a day (BID) | ORAL | Status: DC

## 2016-07-17 MED ORDER — PANTOPRAZOLE SODIUM 40 MG PO TBEC: 40.0000 mg | DELAYED_RELEASE_TABLET | Freq: Two times a day (BID) | ORAL | 4 refills | Status: DC

## 2016-07-17 NOTE — Progress Notes (Signed)
ANTICOAGULATION CONSULT NOTE   Pharmacy Consult for Heparin and warfarin  Indication: mechanical AVR  Allergies  Allergen Reactions  . Codeine     Halluciations  . Sudafed [Pseudoephedrine Hcl]     Halluications    Patient Measurements: Height: 5\' 1"  (154.9 cm) Weight: 159 lb 1.6 oz (72.2 kg) IBW/kg (Calculated) : 47.8 Heparin Dosing Weight: 64 kg  Vital Signs: Temp: 97.7 F (36.5 C) (09/11 0450) Temp Source: Oral (09/11 0450) BP: 129/61 (09/11 0450) Pulse Rate: 68 (09/11 0450)  Labs:  Recent Labs  07/15/16 0406 07/16/16 0506 07/17/16 0230  HGB  --  9.1* 9.2*  HCT  --  28.4* 29.4*  PLT  --  256 282  LABPROT 20.9* 22.3* 23.0*  INR 1.77 1.93 2.00  HEPARINUNFRC 0.36 0.62 0.35  CREATININE 0.81  --   --     Estimated Creatinine Clearance: 50.4 mL/min (by C-G formula based on SCr of 0.81 mg/dL).   Assessment: 82 yr old female admitted 07/12/16 with melanotic stools and supratherapeutic INR (6.49). On Coumadin for hx mechanical AVR.  Received Vitamin K 2.5 mg IV on 9/7 ~2am.   S/p EGD 9/8, large hiatal hernia with mild erosions noted. Planning PPI BID indefinitely.  Heparin and warfarin resumed on 9/8, with plan to target lowest acceptable INR -> goal of 2-2.5 and heparin level of 0.3 - 0.5.   Patient reports home Coumadin regimen of 7.5 mg daily except 5 mg TTS per paper from PCP, though she had been on 5 mg daily for many years.  Heparin level is therapeutic this morning at 0.35. INR is also therapeutic at 2. No bleeding noted, CBC is stable. Patient reported normal looking BM this morning.  Goal of Therapy:  Heparin level 0.3-0.5 units/ml  INR 2-2.5 Monitor platelets by anticoagulation protocol: Yes    Plan:  1. Continue heparin infusion at 650 units/hr - plan is to stop at discharge today 2. Coumadin 5 mg PO tonight, 2.5 mg PO on 9/12, recommend INR check on Wed with further dosing per Coumadin Clinic  3. Daily heparin level, INR, CBC 4. Monitor for s/sx of  bleeding   Loura BackJennifer Morris, PharmD, BCPS Clinical Pharmacist Phone for today [phone] Main pharmacy - [phone] 07/17/2016 10:52 AM

## 2016-07-17 NOTE — Care Management Note (Signed)
Case Management Note Donn PieriniKristi Froilan Mclean RN, BSN Unit 2W-Case Manager [phone]  Patient Details  Name: Tracy Vargas MRN: [id number] Date of Birth: [birth year]  Subjective/Objective:    Pt admitted with GIB                Action/Plan: PTA pt lived at home with spouse- plan to return home- request per MD to made f/u appointment with [facility]- call made and appointment scheduled for Wed. Sept. 13 at 1030- placed on AVS- spoke with pt and spouse at bedside- discussed copay cost- pt pays $10 for office visits and $45 for specialty visits- advised pt to take her insurance card so that the coumadin clinic can advise her best as to what the cost will be for each visit regarding INR draws.   Expected Discharge Date:     07/17/16             Expected Discharge Plan:  Home/Self Care  In-House Referral:     Discharge planning Services  CM Consult, Follow-up appt scheduled  Post Acute Care Choice:    Choice offered to:     DME Arranged:    DME Agency:     HH Arranged:    HH Agency:     Status of Service:  Completed, signed off  If discussed at [employer] Length of Stay Meetings, dates discussed:    Additional Comments:  Darrold SpanWebster, Gean Larose Hall, RN 07/17/2016, 2:10 PM

## 2016-07-17 NOTE — Discharge Summary (Signed)
Physician Discharge Summary  Tracy Vargas UJW:[id number] DOB: July 31, 1934 DOA: 07/12/2016  PCP: Allean Found, MD  Admit date: 07/12/2016 Discharge date: 07/17/2016  Time spent: 65 minutes  Recommendations for Outpatient Follow-up:  1. Follow-up with Allean Found, MD in 2 weeks. On follow-up patient will need a CBC done to follow-up on H&H. 2. Follow-up with [facility] Coumadin clinic on Wednesday, 07/19/2016 for PT/INR check for mechanical aortic valve. Goal INR 2-2.5 secondary to recent upper GI bleed. 3. Follow-up with Dr. Anne Fu, cardiology in 1 month.   Discharge Diagnoses:  Principal Problem:   GI bleed Active Problems:   HTN (hypertension)   Hyperlipidemia   Osteopenia   Chronic diastolic heart failure (HCC)   H/O mechanical aortic valve replacement   Acute blood loss anemia   Bleeding gastrointestinal   Discharge Condition: Stable and improved  Diet recommendation: Heart healthy  Filed Weights   07/13/16 0052 07/16/16 0519 07/17/16 0450  Weight: 72.6 kg (160 lb) 72.4 kg (159 lb 11.2 oz) 72.2 kg (159 lb 1.6 oz)    History of present illness:  Per Dr Rudean Haskell is a 82 y.o. female with medical history significant of mechanical AVR, on chronic coumadin.  Patient presented to ED for evaluation of known INR of 6.6, 2 weeks ago at Dr. Lonn Georgia office and melanotic stools over the past 5 days.  She was seen earlier On the day of admission, at her PCPs office who noted repeat INR elevation further > 8 by their office labs in office (would be 6.5 in ED).  Due to development of 5 days of dark stools, however they referred her to the ED.  Symptoms onset 5 days ago, are persistent, quality of stool is dark in color.  Nothing makes symptoms worse, baking soda PO hasnt made symptoms better.  ED Course: Stool was dark, heme positive, but patient reported that unlike 2 days ago is hard (not soft).  HGB 11.4 (was 11.8 in office at 4pm today).   Hospital  Course:  #1 GI bleed/large hatial hernia with mild cameron ulcers Concern for upper GI bleed, patient presented with melanotic stools in the setting of supratherapeutic INR of 6.6. Patient denied any NSAID use. Patient was admitted given vitamin K and INR reversed. Patient's hemoglobin was followed and was 11.4 on admission and trended down however stabilized at 9.2.  GI was consulted and patient was seen in consultation by Dr. Madilyn Fireman.  Patient underwent upper endoscopy 07/14/2016 which showed a 10 cm hiatal hernia with few Cameron ulcers. Patient was maintained on a PPI IV on admission and prior to endoscopy. Post endoscopy patient was transitioned to oral Protonix twice daily. Was recommended that patient continue twice a day PPI indefinitely per GI recommendations. GI also recommended to keep goal INR on the lower end of therapeutic goal for aortic valve replacement. Patient improved clinically melanotic stools resolved by day of discharge and patient be discharged in stable and improved condition. Patient will follow-up with PCP as outpatient.   #2 acute blood loss anemia Secondary to problem #1. Patient was admitted hemoglobin was followed during the hospitalization. Patient's hemoglobin was 11.4 on discharge and trended down however stabilized at approximately 9.2 by day of discharge. See problem #1.  #3 hypertension Stable. Blood pressure was borderline and as such lisinopril was discontinued during the hospitalization and resumed on discharge. Outpatient follow-up.  #4 hyperlipidemia Continued on statin.  #5 chronic diastolic heart failure Remained stable.   #6 history of  mechanical aortic valve replacement On Coumadin. INR on admission was 6.49. Patient given vitamin K 2.5 mg 1, for INR reversal in order for patient to undergo upper endoscopy. Post endoscopy patient was placed on a heparin bridge with Coumadin with goal INR 2-2.5 per GI recommendations. Patient remained in stable  condition. On day of discharge patient's INR was 2.00. Patient was discharged home on 5 mg of Coumadin on 07/17/2016, 2.5 mg of Coumadin on 07/18/2016. Appointment was made for patient to follow-up in the Coumadin clinic at her cardiologist's office on Wednesday, 07/19/2016 for INR and further recommendations.   Procedures:  Upper endoscopy 07/14/2016 per Dr. Hayes----10 cm hiatal hernia with few Sheria Langameron ulcers.  Consultations:  Gastroenterology: Dr. Madilyn FiremanHayes 07/13/2016  Discharge Exam: Vitals:   07/16/16 1956 07/17/16 0450  BP: (!) 163/63 129/61  Pulse: 76 68  Resp: 18 18  Temp: 98.4 F (36.9 C) 97.7 F (36.5 C)    General: NAD Cardiovascular: RRR with crisp click Respiratory: CTAB  Discharge Instructions   Discharge Instructions    Diet - low sodium heart healthy    Complete by:  As directed   Discharge instructions    Complete by:  As directed   Follow up at coumadin clinic, cardiology office on Wednesday 07/19/2016. Take coumadin 5mg  today, then 2.5 mg tomorrow, then per coumadin clinic recommendations.   Increase activity slowly    Complete by:  As directed     Current Discharge Medication List    START taking these medications   Details  pantoprazole (PROTONIX) 40 MG tablet Take 1 tablet (40 mg total) by mouth 2 (two) times daily. Qty: 60 tablet, Refills: 4    senna-docusate (SENOKOT-S) 8.6-50 MG tablet Take 1 tablet by mouth 2 (two) times daily.      CONTINUE these medications which have CHANGED   Details  warfarin (COUMADIN) 5 MG tablet Take 0.5-1 tablets (2.5-5 mg total) by mouth as directed. Take 5mg  tonight 07/17/2016 and then 2.5 mg tomorrow 07/18/2016 and then per recommendations of coumadin clinic.      CONTINUE these medications which have NOT CHANGED   Details  alendronate (FOSAMAX) 70 MG tablet Take 70 mg by mouth every 7 (seven) days. Take with a full glass of water on an empty stomach. No specific day    atorvastatin (LIPITOR) 10 MG tablet Take 10  mg by mouth daily at 6 PM.     furosemide (LASIX) 20 MG tablet Take 20 mg by mouth every 7 (seven) days. Take once a week per patient . No specific days    lisinopril (PRINIVIL,ZESTRIL) 20 MG tablet Take 20 mg by mouth daily.       Allergies  Allergen Reactions  . Codeine     Halluciations  . Sudafed [Pseudoephedrine Hcl]     Halluications   Follow-up Information    Allean FoundSMITH,CANDACE THIELE, MD. Schedule an appointment as soon as possible for a visit in 2 week(s).   Specialty:  Family Medicine Why:  f/u in 1-2 weeks. Contact information: [address] [place] [address] [phone]        [facility] Follow up on 07/19/2016.   Specialty:  Cardiology Why:  INR check appointment made for 10:30- with coumadin clinic Contact information: [address], [address] [place] [id number] [phone]       Donato SchultzMark Skains, MD. Schedule an appointment as soon as possible for a visit in 1 month(s).   Specialty:  Cardiology  Contact information: [address] [place] [address] [phone]            The results of significant diagnostics from this hospitalization (including imaging, microbiology, ancillary and laboratory) are listed below for reference.    Significant Diagnostic Studies: No results found.  Microbiology: No results found for this or any previous visit (from the past 240 hour(s)).   Labs: Basic Metabolic Panel:  Recent Labs Lab 07/12/16 1728 07/14/16 0435 07/15/16 0406  NA 135 136 138  K 4.3 4.5 4.1  CL 105 108 109  CO2 23 22 24   GLUCOSE 104* 108* 97  BUN 39* 13 16  CREATININE 0.85 0.84 0.81  CALCIUM 8.9 8.6* 8.5*   Liver Function Tests:  Recent Labs Lab 07/12/16 1728  AST 30  ALT 21  ALKPHOS 66  BILITOT 0.2*  PROT 6.3*  ALBUMIN 3.8   No results for input(s): LIPASE, AMYLASE in the last 168 hours. No results for input(s): AMMONIA in the last 168  hours. CBC:  Recent Labs Lab 07/12/16 1728 07/13/16 0741 07/13/16 1653 07/14/16 0435 07/16/16 0506 07/17/16 0230  WBC 9.0 9.1  --  8.4 7.4 8.6  HGB 11.4* 9.8* 9.4* 9.7* 9.1* 9.2*  HCT 36.3 30.7* 29.4* 31.0* 28.4* 29.4*  MCV 88.8 88.0  --  88.3 88.5 89.1  PLT 314 255  --  264 256 282   Cardiac Enzymes: No results for input(s): CKTOTAL, CKMB, CKMBINDEX, TROPONINI in the last 168 hours. BNP: BNP (last 3 results) No results for input(s): BNP in the last 8760 hours.  ProBNP (last 3 results) No results for input(s): PROBNP in the last 8760 hours.  CBG: No results for input(s): GLUCAP in the last 168 hours.     SignedRamiro Harvest MD.  [facility] 07/17/2016, 1:57 PM

## 2016-07-17 NOTE — Anesthesia Postprocedure Evaluation (Signed)
Anesthesia Post Note  Patient: Tracy Vargas  Procedure(s) Performed: Procedure(s) (LRB): ESOPHAGOGASTRODUODENOSCOPY (EGD) (N/A)  Patient location during evaluation: Endoscopy Anesthesia Type: MAC Level of consciousness: awake Pain management: pain level controlled Vital Signs Assessment: post-procedure vital signs reviewed and stable Respiratory status: spontaneous breathing Cardiovascular status: stable Postop Assessment: no signs of nausea or vomiting Anesthetic complications: no    Last Vitals:  Vitals:   07/16/16 1956 07/17/16 0450  BP: (!) 163/63 129/61  Pulse: 76 68  Resp: 18 18  Temp: 36.9 C 36.5 C    Last Pain:  Vitals:   07/17/16 0450  TempSrc: Oral                 Kimesha Claxton

## 2016-07-17 NOTE — Anesthesia Preprocedure Evaluation (Signed)
Anesthesia Evaluation  Patient identified by MRN, date of birth, ID band Patient awake    Reviewed: Allergy & Precautions, NPO status , Patient's Chart, lab work & pertinent test results  History of Anesthesia Complications Negative for: history of anesthetic complications  Airway Mallampati: II  TM Distance: >3 FB Neck ROM: Full    Dental   Pulmonary shortness of breath,    breath sounds clear to auscultation       Cardiovascular hypertension, Pt. on medications + Valvular Problems/Murmurs  Rhythm:Regular + Systolic murmurs    Neuro/Psych negative neurological ROS     GI/Hepatic negative GI ROS, Neg liver ROS,   Endo/Other  negative endocrine ROS  Renal/GU negative Renal ROS     Musculoskeletal   Abdominal   Peds  Hematology  (+) anemia ,   Anesthesia Other Findings Mechanical avr  Reproductive/Obstetrics                             Anesthesia Physical Anesthesia Plan  ASA: II  Anesthesia Plan: MAC   Post-op Pain Management:    Induction: Intravenous  Airway Management Planned: Natural Airway, Nasal Cannula and Simple Face Mask  Additional Equipment: None  Intra-op Plan:   Post-operative Plan:   Informed Consent: I have reviewed the patients History and Physical, chart, labs and discussed the procedure including the risks, benefits and alternatives for the proposed anesthesia with the patient or authorized representative who has indicated his/her understanding and acceptance.   Dental advisory given  Plan Discussed with: CRNA and Surgeon  Anesthesia Plan Comments:         Anesthesia Quick Evaluation

## 2016-07-17 NOTE — Care Management Important Message (Signed)
Important Message  Patient Details  Name: Tracy AlarDoris E Vargas MRN: [id number] Date of Birth: 1933/11/09   Medicare Important Message Given:  Yes    Madeeha Costantino [name] 07/17/2016, 1:47 PM

## 2016-07-17 NOTE — Progress Notes (Signed)
Tracy Vargas to be D/C'd Home per MD order. Discussed with the patient and all questions fully answered.    Medication List    TAKE these medications   alendronate 70 MG tablet Commonly known as:  FOSAMAX Take 70 mg by mouth every 7 (seven) days. Take with a full glass of water on an empty stomach. No specific day   atorvastatin 10 MG tablet Commonly known as:  LIPITOR Take 10 mg by mouth daily at 6 PM.   furosemide 20 MG tablet Commonly known as:  LASIX Take 20 mg by mouth every 7 (seven) days. Take once a week per patient . No specific days   lisinopril 20 MG tablet Commonly known as:  PRINIVIL,ZESTRIL Take 20 mg by mouth daily.   pantoprazole 40 MG tablet Commonly known as:  PROTONIX Take 1 tablet (40 mg total) by mouth 2 (two) times daily.   senna-docusate 8.6-50 MG tablet Commonly known as:  Senokot-S Take 1 tablet by mouth 2 (two) times daily.   warfarin 5 MG tablet Commonly known as:  COUMADIN Take 0.5-1 tablets (2.5-5 mg total) by mouth as directed. Take 5mg  tonight 07/17/2016 and then 2.5 mg tomorrow 07/18/2016 and then per recommendations of coumadin clinic. What changed:  how much to take  additional instructions       VVS, Skin clean, dry and intact without evidence of skin break down, no evidence of skin tears noted.  IV catheter discontinued intact. Site without signs and symptoms of complications. Dressing and pressure applied.  An After Visit Summary was printed and given to the patient.  Patient escorted via WC, and D/C home via private auto.  Tracy Vargas, Tracy Vargas  07/17/2016 2:38 PM

## 2016-07-19 ENCOUNTER — Ambulatory Visit (INDEPENDENT_AMBULATORY_CARE_PROVIDER_SITE_OTHER): Payer: Commercial Managed Care - HMO | Admitting: Pharmacist

## 2016-07-19 ENCOUNTER — Institutional Professional Consult (permissible substitution) (INDEPENDENT_AMBULATORY_CARE_PROVIDER_SITE_OTHER): Payer: Commercial Managed Care - HMO

## 2016-07-19 DIAGNOSIS — Z5181 Encounter for therapeutic drug level monitoring: Secondary | ICD-10-CM | POA: Diagnosis not present

## 2016-07-19 DIAGNOSIS — Z954 Presence of other heart-valve replacement: Secondary | ICD-10-CM | POA: Diagnosis not present

## 2016-07-19 DIAGNOSIS — Z952 Presence of prosthetic heart valve: Secondary | ICD-10-CM

## 2016-07-19 LAB — POCT INR: INR: 2.1

## 2016-07-26 ENCOUNTER — Ambulatory Visit (INDEPENDENT_AMBULATORY_CARE_PROVIDER_SITE_OTHER): Payer: Commercial Managed Care - HMO | Admitting: Pharmacist

## 2016-07-26 DIAGNOSIS — Z954 Presence of other heart-valve replacement: Secondary | ICD-10-CM | POA: Diagnosis not present

## 2016-07-26 DIAGNOSIS — Z952 Presence of prosthetic heart valve: Secondary | ICD-10-CM

## 2016-07-26 DIAGNOSIS — Z5181 Encounter for therapeutic drug level monitoring: Secondary | ICD-10-CM | POA: Diagnosis not present

## 2016-07-26 LAB — POCT INR: INR: 2.9

## 2016-07-27 ENCOUNTER — Encounter: Payer: Self-pay | Admitting: Physician Assistant

## 2016-08-04 ENCOUNTER — Ambulatory Visit (INDEPENDENT_AMBULATORY_CARE_PROVIDER_SITE_OTHER): Payer: Commercial Managed Care - HMO | Admitting: *Deleted

## 2016-08-04 DIAGNOSIS — Z954 Presence of other heart-valve replacement: Secondary | ICD-10-CM

## 2016-08-04 DIAGNOSIS — Z5181 Encounter for therapeutic drug level monitoring: Secondary | ICD-10-CM | POA: Diagnosis not present

## 2016-08-04 DIAGNOSIS — Z952 Presence of prosthetic heart valve: Secondary | ICD-10-CM

## 2016-08-04 LAB — POCT INR: INR: 2.1

## 2016-08-10 NOTE — Progress Notes (Signed)
Cardiology Office Note    Date:  08/14/2016   ID:  Tracy Vargas, DOB May 24, 1934, MRN [id number]  PCP:  Allean Found, MD  Cardiologist:  Dr. Anne Fu  CC: post hospital follow up.   History of Present Illness:  Tracy Vargas is a 82 y.o. female with a history of HTN, HLD, chronic diastolic CHF, carotid artery disease,  Hx of aortic mechanical valve (1999) on coumadin, and hx of recent GI bleed who presents to clinic for follow up.   She was last seen by Dr Anne Fu in 11/2014. She was doing well from a cardiac standpoint.   SHe was admitted 9/6-9/11/17 for melanotic stools in the setting of supratheraputic INRS (at one point ~8). She was admitted and given vitamin K and INR reversed. Patient's hemoglobin was followed and was 11.4 on admission and trended down but stabilized at 9.2. Patient underwent upper endoscopy 07/14/2016 which showed a 10 cm hiatal hernia with few Cameron ulcers. She was maintained on a PPI IV on admission and prior to endoscopy. Post endoscopy patient was transitioned to oral Protonix twice daily. It was recommended that patient continue twice a day PPI indefinitely per GI recommendations. GI also recommended to keep goal INR on the lower end of therapeutic goal for aortic valve replacement (goal INR 2-2.5). She says that her lasix 20mg  daily was changed to 1 x a week and she was told to take her lisinopril only if her BP was high.   You saw her PCP last week who changed lisinopril from 20mg  to 5mg  daily to take if needed. She has been keeping a BP log for her Dr. Katrinka Blazing and they have been running really good and she rarely has to take her lisinopril.   Today she presents to clinic for follow up. She has been feeling quite well, better than she has in months in fact. No blood in stool or urine. No chest pain or SOB. No LE edema, orthopnea or PND. No dizziness or syncope.  She occasionally gets leg cramping and Dr. Katrinka Blazing told her to start taking magnesium. She has a lot of  energy and loves to garden.    Past Medical History:  Diagnosis Date  . Acquired hyperlipoproteinemia   . Chronic diastolic heart failure (HCC) 09/01/2013  . Diastolic dysfunction   . Dyspnea   . HTN (hypertension)   . Hyperlipidemia   . Osteopenia     Past Surgical History:  Procedure Laterality Date  . APPENDECTOMY    . CARDIAC VALVE REPLACEMENT    . CATARACT EXTRACTION    . ESOPHAGOGASTRODUODENOSCOPY N/A 07/14/2016   Procedure: ESOPHAGOGASTRODUODENOSCOPY (EGD);  Surgeon: Dorena Cookey, MD;  Location: [facility] ENDOSCOPY;  Service: Endoscopy;  Laterality: N/A;  . ROTATOR CUFF REPAIR    . TONSILLECTOMY      Current Medications: Outpatient Medications Prior to Visit  Medication Sig Dispense Refill  . alendronate (FOSAMAX) 70 MG tablet Take 70 mg by mouth every 7 (seven) days. Take with a full glass of water on an empty stomach. No specific day    . Ascorbic Acid (VITAMIN C) 1000 MG tablet Take 1,000 mg by mouth daily.    Marland Kitchen atorvastatin (LIPITOR) 10 MG tablet Take 10 mg by mouth daily at 6 PM.     . b complex vitamins capsule Take 1 capsule by mouth daily.    . Biotin 5000 MCG TABS Take 1 tablet by mouth 3 (three) times a week.    . calcium citrate-vitamin D (CALCIUM  CITRATE + D) 315-200 MG-UNIT tablet Take 2 tablets by mouth 2 (two) times daily.    . Cholecalciferol (VITAMIN D) 2000 units CAPS Take 1 capsule by mouth daily.    . Docosahexaenoic Acid-EPA (OMEGA-3) 180-270 MG CAPS Take 1 capsule by mouth daily.    Marland Kitchen docusate sodium (COLACE) 250 MG capsule Take 250 mg by mouth daily.    . furosemide (LASIX) 20 MG tablet Take 20 mg by mouth every 7 (seven) days. Take once a week per patient . No specific days    . Glucosamine-MSM-Hyaluronic Acd 750-375-30 MG TABS Take 2 tablets by mouth 2 (two) times daily.    Marland Kitchen lisinopril (PRINIVIL,ZESTRIL) 20 MG tablet Take 20 mg by mouth daily.    . Magnesium (CVS TRIPLE MAGNESIUM COMPLEX) 400 MG CAPS Take 2 capsules by mouth daily.    . pantoprazole  (PROTONIX) 40 MG tablet Take 1 tablet (40 mg total) by mouth 2 (two) times daily. 60 tablet 4  . Probiotic Product (SOLUBLE FIBER/PROBIOTICS PO) Take 2 tablets by mouth daily.    Marland Kitchen senna-docusate (SENOKOT-S) 8.6-50 MG tablet Take 1 tablet by mouth 2 (two) times daily.    Marland Kitchen warfarin (COUMADIN) 5 MG tablet Take 0.5-1 tablets (2.5-5 mg total) by mouth as directed. Take 5mg  tonight 07/17/2016 and then 2.5 mg tomorrow 07/18/2016 and then per recommendations of coumadin clinic.    Marland Kitchen zinc gluconate 50 MG tablet Take 50 mg by mouth daily.     No facility-administered medications prior to visit.      Allergies:   Codeine and Sudafed [pseudoephedrine hcl]   Social History   Social History  . Marital status: Married    Spouse name: N/A  . Number of children: N/A  . Years of education: N/A   Social History Main Topics  . Smoking status: Never Smoker  . Smokeless tobacco: Never Used  . Alcohol use No  . Drug use: No  . Sexual activity: Not Asked   Other Topics Concern  . None   Social History Narrative  . None     Family History:  The patient's family history includes Heart failure in her sister.     ROS:   Please see the history of present illness.    ROS All other systems reviewed and are negative.   PHYSICAL EXAM:   VS:  BP 110/62   Pulse 67   Ht 5\' 1"  (1.549 m)   Wt 163 lb 1.9 oz (74 kg)   BMI 30.82 kg/m    GEN: Well nourished, well developed, in no acute distress  HEENT: normal  Neck: no JVD, Left carotid bruits, or masses Cardiac: RRR; no murmurs, rubs, or gallops,no edema, good mechanical click Respiratory:  clear to auscultation bilaterally, normal work of breathing GI: soft, nontender, nondistended, + BS MS: no deformity or atrophy  Skin: warm and dry, no rash Neuro:  Alert and Oriented x 3, Strength and sensation are intact Psych: euthymic mood, full affect  Wt Readings from Last 3 Encounters:  08/14/16 163 lb 1.9 oz (74 kg)  07/17/16 159 lb 1.6 oz (72.2 kg)    11/11/14 169 lb (76.7 kg)      Studies/Labs Reviewed:   EKG:  EKG is ordered today.  The ekg ordered today demonstrates NSR HR 88  Recent Labs: 07/12/2016: ALT 21 07/15/2016: BUN 16; Creatinine, Ser 0.81; Potassium 4.1; Sodium 138 07/17/2016: Hemoglobin 9.2; Platelets 282   Lipid Panel No results found for: CHOL, TRIG, HDL, CHOLHDL, VLDL, LDLCALC,  LDLDIRECT  Additional studies/ records that were reviewed today include:  Carotid artery dopplers : 09/23/2014 <40 bilateral carotid stenosis. F/U study in 2 years  Echocardiogram 08/2013: normal ejection fraction, mechanical aortic valve with upper normal peak velocity.1/20145 <40% bilaterally.    ASSESSMENT & PLAN:   Tracy Vargas is a 82 y.o. female with a history of HTN, HLD, chronic diastolic CHF, carotid artery disease,  Hx of aortic mechanical valve on coumadin, and hx of recent GI bleed who presents to clinic for follow up.   Hx of aortic valve replacement: continue coumadin. INR 1.8 on 08/11/16. Now goal 2-2.5 with recent GI bleed. Continue PPI and GI follow up. Hg is stable.   Chronic diastolic CHF: appears euvolemic on lasix 20mg  once a week.  Carotid artery disease: due for carotid dopplers in 09/2016. Will have this arranged.   HTN: BP well controlled currently   HLD: continue statin   Medication Adjustments/Labs and Tests Ordered: Current medicines are reviewed at length with the patient today.  Concerns regarding medicines are outlined above.  Medication changes, Labs and Tests ordered today are listed in the Patient Instructions below. Patient Instructions  Medication Instructions:  Your physician recommends that you continue on your current medications as directed. Please refer to the Current Medication list given to you today.   Labwork: None ordered  Testing/Procedures: Your physician has requested that you have a carotid duplex IN November.  This test is an ultrasound of the carotid arteries in your neck. It  looks at blood flow through these arteries that supply the brain with blood. Allow one hour for this exam. There are no restrictions or special instructions.    Follow-Up: Your physician wants you to follow-up in: 1 YEAR WITH DR. Anne FuSKAINS  You will receive a reminder letter in the mail two months in advance. If you don't receive a letter, please call our office to schedule the follow-up appointment.   Any Other Special Instructions Will Be Listed Below (If Applicable).     If you need a refill on your cardiac medications before your next appointment, please call your pharmacy.      Signed, Cline CrockKathryn Yandel Zeiner, PA-C  08/14/2016 10:38 AM    [facility] [address], [place], [place]  [address] Phone: [phone]; Fax: [phone]

## 2016-08-11 ENCOUNTER — Ambulatory Visit (INDEPENDENT_AMBULATORY_CARE_PROVIDER_SITE_OTHER): Payer: Commercial Managed Care - HMO

## 2016-08-11 DIAGNOSIS — Z5181 Encounter for therapeutic drug level monitoring: Secondary | ICD-10-CM

## 2016-08-11 DIAGNOSIS — Z952 Presence of prosthetic heart valve: Secondary | ICD-10-CM | POA: Diagnosis not present

## 2016-08-11 LAB — POCT INR: INR: 1.8

## 2016-08-14 ENCOUNTER — Encounter (INDEPENDENT_AMBULATORY_CARE_PROVIDER_SITE_OTHER): Payer: Self-pay

## 2016-08-14 ENCOUNTER — Encounter: Payer: Self-pay | Admitting: Physician Assistant

## 2016-08-14 ENCOUNTER — Ambulatory Visit (INDEPENDENT_AMBULATORY_CARE_PROVIDER_SITE_OTHER): Payer: Commercial Managed Care - HMO | Admitting: Physician Assistant

## 2016-08-14 VITALS — BP 110/62 | HR 67 | Ht 61.0 in | Wt 163.1 lb

## 2016-08-14 DIAGNOSIS — Z952 Presence of prosthetic heart valve: Secondary | ICD-10-CM

## 2016-08-14 DIAGNOSIS — E785 Hyperlipidemia, unspecified: Secondary | ICD-10-CM

## 2016-08-14 DIAGNOSIS — I5032 Chronic diastolic (congestive) heart failure: Secondary | ICD-10-CM | POA: Diagnosis not present

## 2016-08-14 DIAGNOSIS — I6523 Occlusion and stenosis of bilateral carotid arteries: Secondary | ICD-10-CM

## 2016-08-14 DIAGNOSIS — I10 Essential (primary) hypertension: Secondary | ICD-10-CM | POA: Diagnosis not present

## 2016-08-14 NOTE — Patient Instructions (Addendum)
Medication Instructions:  Your physician recommends that you continue on your current medications as directed. Please refer to the Current Medication list given to you today.   Labwork: None ordered  Testing/Procedures: Your physician has requested that you have a carotid duplex IN November.  This test is an ultrasound of the carotid arteries in your neck. It looks at blood flow through these arteries that supply the brain with blood. Allow one hour for this exam. There are no restrictions or special instructions.    Follow-Up: Your physician wants you to follow-up in: 1 YEAR WITH DR. Anne FuSKAINS  You will receive a reminder letter in the mail two months in advance. If you don't receive a letter, please call our office to schedule the follow-up appointment.   Any Other Special Instructions Will Be Listed Below (If Applicable).     If you need a refill on your cardiac medications before your next appointment, please call your pharmacy.

## 2016-08-18 ENCOUNTER — Ambulatory Visit (INDEPENDENT_AMBULATORY_CARE_PROVIDER_SITE_OTHER): Payer: Commercial Managed Care - HMO | Admitting: *Deleted

## 2016-08-18 DIAGNOSIS — Z952 Presence of prosthetic heart valve: Secondary | ICD-10-CM

## 2016-08-18 DIAGNOSIS — Z5181 Encounter for therapeutic drug level monitoring: Secondary | ICD-10-CM | POA: Diagnosis not present

## 2016-08-18 LAB — POCT INR: INR: 1.9

## 2016-08-28 ENCOUNTER — Ambulatory Visit (INDEPENDENT_AMBULATORY_CARE_PROVIDER_SITE_OTHER): Payer: Commercial Managed Care - HMO

## 2016-08-28 DIAGNOSIS — Z952 Presence of prosthetic heart valve: Secondary | ICD-10-CM

## 2016-08-28 DIAGNOSIS — Z5181 Encounter for therapeutic drug level monitoring: Secondary | ICD-10-CM | POA: Diagnosis not present

## 2016-08-28 LAB — POCT INR: INR: 1.9

## 2016-09-07 ENCOUNTER — Ambulatory Visit (INDEPENDENT_AMBULATORY_CARE_PROVIDER_SITE_OTHER): Payer: Commercial Managed Care - HMO | Admitting: Pharmacist

## 2016-09-07 ENCOUNTER — Ambulatory Visit (HOSPITAL_COMMUNITY)
Admission: RE | Admit: 2016-09-07 | Discharge: 2016-09-07 | Disposition: A | Discharge status: Home / Self Care | Payer: Commercial Managed Care - HMO | Source: Ambulatory Visit | Attending: Cardiology | Admitting: Cardiology

## 2016-09-07 DIAGNOSIS — Z952 Presence of prosthetic heart valve: Secondary | ICD-10-CM | POA: Diagnosis not present

## 2016-09-07 DIAGNOSIS — Z5181 Encounter for therapeutic drug level monitoring: Secondary | ICD-10-CM | POA: Diagnosis not present

## 2016-09-07 DIAGNOSIS — I6523 Occlusion and stenosis of bilateral carotid arteries: Secondary | ICD-10-CM | POA: Diagnosis not present

## 2016-09-07 LAB — POCT INR: INR: 2.2

## 2016-09-21 ENCOUNTER — Ambulatory Visit (INDEPENDENT_AMBULATORY_CARE_PROVIDER_SITE_OTHER): Payer: Commercial Managed Care - HMO

## 2016-09-21 DIAGNOSIS — Z5181 Encounter for therapeutic drug level monitoring: Secondary | ICD-10-CM | POA: Diagnosis not present

## 2016-09-21 DIAGNOSIS — Z952 Presence of prosthetic heart valve: Secondary | ICD-10-CM

## 2016-09-21 LAB — POCT INR: INR: 1.8

## 2016-10-11 ENCOUNTER — Ambulatory Visit (INDEPENDENT_AMBULATORY_CARE_PROVIDER_SITE_OTHER): Payer: Commercial Managed Care - HMO | Admitting: *Deleted

## 2016-10-11 DIAGNOSIS — Z952 Presence of prosthetic heart valve: Secondary | ICD-10-CM | POA: Diagnosis not present

## 2016-10-11 DIAGNOSIS — Z5181 Encounter for therapeutic drug level monitoring: Secondary | ICD-10-CM

## 2016-10-11 LAB — POCT INR: INR: 2.5

## 2016-11-08 ENCOUNTER — Ambulatory Visit (INDEPENDENT_AMBULATORY_CARE_PROVIDER_SITE_OTHER): Payer: Medicare HMO | Admitting: *Deleted

## 2016-11-08 DIAGNOSIS — Z952 Presence of prosthetic heart valve: Secondary | ICD-10-CM

## 2016-11-08 DIAGNOSIS — Z5181 Encounter for therapeutic drug level monitoring: Secondary | ICD-10-CM

## 2016-11-08 LAB — POCT INR: INR: 2

## 2016-11-28 DIAGNOSIS — H01003 Unspecified blepharitis right eye, unspecified eyelid: Secondary | ICD-10-CM | POA: Diagnosis not present

## 2016-11-28 DIAGNOSIS — H04123 Dry eye syndrome of bilateral lacrimal glands: Secondary | ICD-10-CM | POA: Diagnosis not present

## 2016-11-28 DIAGNOSIS — M3501 Sicca syndrome with keratoconjunctivitis: Secondary | ICD-10-CM | POA: Diagnosis not present

## 2016-12-07 ENCOUNTER — Ambulatory Visit (INDEPENDENT_AMBULATORY_CARE_PROVIDER_SITE_OTHER): Payer: Medicare HMO | Admitting: *Deleted

## 2016-12-07 DIAGNOSIS — Z5181 Encounter for therapeutic drug level monitoring: Secondary | ICD-10-CM | POA: Diagnosis not present

## 2016-12-07 DIAGNOSIS — Z952 Presence of prosthetic heart valve: Secondary | ICD-10-CM

## 2016-12-07 LAB — POCT INR: INR: 3.2

## 2016-12-07 MED ORDER — WARFARIN SODIUM 5 MG PO TABS: ORAL_TABLET | ORAL | 1 refills | Status: DC

## 2016-12-21 ENCOUNTER — Ambulatory Visit (INDEPENDENT_AMBULATORY_CARE_PROVIDER_SITE_OTHER): Payer: Medicare HMO

## 2016-12-21 DIAGNOSIS — Z952 Presence of prosthetic heart valve: Secondary | ICD-10-CM | POA: Diagnosis not present

## 2016-12-21 DIAGNOSIS — Z5181 Encounter for therapeutic drug level monitoring: Secondary | ICD-10-CM

## 2016-12-21 LAB — POCT INR: INR: 2.8

## 2017-01-11 ENCOUNTER — Ambulatory Visit (INDEPENDENT_AMBULATORY_CARE_PROVIDER_SITE_OTHER): Payer: Medicare HMO

## 2017-01-11 DIAGNOSIS — Z5181 Encounter for therapeutic drug level monitoring: Secondary | ICD-10-CM

## 2017-01-11 DIAGNOSIS — Z952 Presence of prosthetic heart valve: Secondary | ICD-10-CM | POA: Diagnosis not present

## 2017-01-11 LAB — POCT INR: INR: 3.2

## 2017-01-25 ENCOUNTER — Ambulatory Visit (INDEPENDENT_AMBULATORY_CARE_PROVIDER_SITE_OTHER): Payer: Medicare HMO | Admitting: *Deleted

## 2017-01-25 DIAGNOSIS — Z5181 Encounter for therapeutic drug level monitoring: Secondary | ICD-10-CM

## 2017-01-25 DIAGNOSIS — E7800 Pure hypercholesterolemia, unspecified: Secondary | ICD-10-CM | POA: Diagnosis not present

## 2017-01-25 DIAGNOSIS — I10 Essential (primary) hypertension: Secondary | ICD-10-CM | POA: Diagnosis not present

## 2017-01-25 DIAGNOSIS — Z7901 Long term (current) use of anticoagulants: Secondary | ICD-10-CM | POA: Diagnosis not present

## 2017-01-25 DIAGNOSIS — Z952 Presence of prosthetic heart valve: Secondary | ICD-10-CM | POA: Diagnosis not present

## 2017-01-25 LAB — POCT INR: INR: 1.9

## 2017-02-06 DIAGNOSIS — Z7901 Long term (current) use of anticoagulants: Secondary | ICD-10-CM | POA: Diagnosis not present

## 2017-02-06 DIAGNOSIS — Z9849 Cataract extraction status, unspecified eye: Secondary | ICD-10-CM | POA: Diagnosis not present

## 2017-02-06 DIAGNOSIS — E669 Obesity, unspecified: Secondary | ICD-10-CM | POA: Diagnosis not present

## 2017-02-06 DIAGNOSIS — I10 Essential (primary) hypertension: Secondary | ICD-10-CM | POA: Diagnosis not present

## 2017-02-06 DIAGNOSIS — Z952 Presence of prosthetic heart valve: Secondary | ICD-10-CM | POA: Diagnosis not present

## 2017-02-06 DIAGNOSIS — Z0000 Encounter for general adult medical examination without abnormal findings: Secondary | ICD-10-CM | POA: Diagnosis not present

## 2017-02-06 DIAGNOSIS — H9113 Presbycusis, bilateral: Secondary | ICD-10-CM | POA: Diagnosis not present

## 2017-02-06 DIAGNOSIS — R601 Generalized edema: Secondary | ICD-10-CM | POA: Diagnosis not present

## 2017-02-06 DIAGNOSIS — Z974 Presence of external hearing-aid: Secondary | ICD-10-CM | POA: Diagnosis not present

## 2017-02-06 DIAGNOSIS — H541213 Low vision right eye category 1, blindness left eye category 3: Secondary | ICD-10-CM | POA: Diagnosis not present

## 2017-02-06 DIAGNOSIS — Z6831 Body mass index (BMI) 31.0-31.9, adult: Secondary | ICD-10-CM | POA: Diagnosis not present

## 2017-02-06 DIAGNOSIS — E7800 Pure hypercholesterolemia, unspecified: Secondary | ICD-10-CM | POA: Diagnosis not present

## 2017-02-08 ENCOUNTER — Ambulatory Visit (INDEPENDENT_AMBULATORY_CARE_PROVIDER_SITE_OTHER): Payer: Medicare HMO | Admitting: *Deleted

## 2017-02-08 DIAGNOSIS — Z5181 Encounter for therapeutic drug level monitoring: Secondary | ICD-10-CM

## 2017-02-08 DIAGNOSIS — Z952 Presence of prosthetic heart valve: Secondary | ICD-10-CM

## 2017-02-08 LAB — POCT INR: INR: 2.5

## 2017-03-01 ENCOUNTER — Ambulatory Visit (INDEPENDENT_AMBULATORY_CARE_PROVIDER_SITE_OTHER): Payer: Medicare HMO | Admitting: *Deleted

## 2017-03-01 DIAGNOSIS — Z5181 Encounter for therapeutic drug level monitoring: Secondary | ICD-10-CM

## 2017-03-01 DIAGNOSIS — Z952 Presence of prosthetic heart valve: Secondary | ICD-10-CM | POA: Diagnosis not present

## 2017-03-01 LAB — POCT INR: INR: 2.6

## 2017-03-08 DIAGNOSIS — K219 Gastro-esophageal reflux disease without esophagitis: Secondary | ICD-10-CM | POA: Diagnosis not present

## 2017-03-08 DIAGNOSIS — E785 Hyperlipidemia, unspecified: Secondary | ICD-10-CM | POA: Diagnosis not present

## 2017-03-08 DIAGNOSIS — M85859 Other specified disorders of bone density and structure, unspecified thigh: Secondary | ICD-10-CM | POA: Diagnosis not present

## 2017-03-08 DIAGNOSIS — I10 Essential (primary) hypertension: Secondary | ICD-10-CM | POA: Diagnosis not present

## 2017-03-12 DIAGNOSIS — M9903 Segmental and somatic dysfunction of lumbar region: Secondary | ICD-10-CM | POA: Diagnosis not present

## 2017-03-12 DIAGNOSIS — M5136 Other intervertebral disc degeneration, lumbar region: Secondary | ICD-10-CM | POA: Diagnosis not present

## 2017-03-12 DIAGNOSIS — M9902 Segmental and somatic dysfunction of thoracic region: Secondary | ICD-10-CM | POA: Diagnosis not present

## 2017-03-12 DIAGNOSIS — M5432 Sciatica, left side: Secondary | ICD-10-CM | POA: Diagnosis not present

## 2017-03-12 DIAGNOSIS — M5134 Other intervertebral disc degeneration, thoracic region: Secondary | ICD-10-CM | POA: Diagnosis not present

## 2017-03-12 DIAGNOSIS — M9904 Segmental and somatic dysfunction of sacral region: Secondary | ICD-10-CM | POA: Diagnosis not present

## 2017-03-13 DIAGNOSIS — M545 Low back pain: Secondary | ICD-10-CM | POA: Diagnosis not present

## 2017-03-23 ENCOUNTER — Ambulatory Visit (INDEPENDENT_AMBULATORY_CARE_PROVIDER_SITE_OTHER): Payer: Medicare HMO | Admitting: *Deleted

## 2017-03-23 ENCOUNTER — Other Ambulatory Visit: Payer: Self-pay | Admitting: *Deleted

## 2017-03-23 DIAGNOSIS — Z5181 Encounter for therapeutic drug level monitoring: Secondary | ICD-10-CM

## 2017-03-23 DIAGNOSIS — Z952 Presence of prosthetic heart valve: Secondary | ICD-10-CM | POA: Diagnosis not present

## 2017-03-23 LAB — POCT INR: INR: 2.5

## 2017-03-23 MED ORDER — WARFARIN SODIUM 5 MG PO TABS: ORAL_TABLET | ORAL | 1 refills | Status: DC

## 2017-04-04 DIAGNOSIS — M5432 Sciatica, left side: Secondary | ICD-10-CM | POA: Diagnosis not present

## 2017-04-04 DIAGNOSIS — M5136 Other intervertebral disc degeneration, lumbar region: Secondary | ICD-10-CM | POA: Diagnosis not present

## 2017-04-04 DIAGNOSIS — M9902 Segmental and somatic dysfunction of thoracic region: Secondary | ICD-10-CM | POA: Diagnosis not present

## 2017-04-04 DIAGNOSIS — M9904 Segmental and somatic dysfunction of sacral region: Secondary | ICD-10-CM | POA: Diagnosis not present

## 2017-04-04 DIAGNOSIS — M9903 Segmental and somatic dysfunction of lumbar region: Secondary | ICD-10-CM | POA: Diagnosis not present

## 2017-04-04 DIAGNOSIS — M5134 Other intervertebral disc degeneration, thoracic region: Secondary | ICD-10-CM | POA: Diagnosis not present

## 2017-04-19 ENCOUNTER — Ambulatory Visit (INDEPENDENT_AMBULATORY_CARE_PROVIDER_SITE_OTHER): Payer: Medicare HMO | Admitting: *Deleted

## 2017-04-19 DIAGNOSIS — Z952 Presence of prosthetic heart valve: Secondary | ICD-10-CM

## 2017-04-19 DIAGNOSIS — Z5181 Encounter for therapeutic drug level monitoring: Secondary | ICD-10-CM | POA: Diagnosis not present

## 2017-04-19 LAB — POCT INR: INR: 3.4

## 2017-05-10 ENCOUNTER — Ambulatory Visit (INDEPENDENT_AMBULATORY_CARE_PROVIDER_SITE_OTHER): Payer: Medicare HMO | Admitting: *Deleted

## 2017-05-10 DIAGNOSIS — Z5181 Encounter for therapeutic drug level monitoring: Secondary | ICD-10-CM | POA: Diagnosis not present

## 2017-05-10 DIAGNOSIS — Z952 Presence of prosthetic heart valve: Secondary | ICD-10-CM | POA: Diagnosis not present

## 2017-05-10 LAB — POCT INR: INR: 3.9

## 2017-05-24 ENCOUNTER — Ambulatory Visit (INDEPENDENT_AMBULATORY_CARE_PROVIDER_SITE_OTHER): Payer: Medicare HMO | Admitting: *Deleted

## 2017-05-24 DIAGNOSIS — Z5181 Encounter for therapeutic drug level monitoring: Secondary | ICD-10-CM

## 2017-05-24 DIAGNOSIS — Z952 Presence of prosthetic heart valve: Secondary | ICD-10-CM | POA: Diagnosis not present

## 2017-05-24 LAB — POCT INR: INR: 3.1

## 2017-06-08 ENCOUNTER — Ambulatory Visit (INDEPENDENT_AMBULATORY_CARE_PROVIDER_SITE_OTHER): Payer: Medicare HMO

## 2017-06-08 DIAGNOSIS — Z5181 Encounter for therapeutic drug level monitoring: Secondary | ICD-10-CM

## 2017-06-08 DIAGNOSIS — Z952 Presence of prosthetic heart valve: Secondary | ICD-10-CM | POA: Diagnosis not present

## 2017-06-08 LAB — POCT INR: INR: 2.1

## 2017-06-25 DIAGNOSIS — H40013 Open angle with borderline findings, low risk, bilateral: Secondary | ICD-10-CM | POA: Diagnosis not present

## 2017-06-25 DIAGNOSIS — Z961 Presence of intraocular lens: Secondary | ICD-10-CM | POA: Diagnosis not present

## 2017-06-25 DIAGNOSIS — H01003 Unspecified blepharitis right eye, unspecified eyelid: Secondary | ICD-10-CM | POA: Diagnosis not present

## 2017-06-25 DIAGNOSIS — H04123 Dry eye syndrome of bilateral lacrimal glands: Secondary | ICD-10-CM | POA: Diagnosis not present

## 2017-06-29 ENCOUNTER — Ambulatory Visit (INDEPENDENT_AMBULATORY_CARE_PROVIDER_SITE_OTHER): Payer: Medicare HMO | Admitting: *Deleted

## 2017-06-29 DIAGNOSIS — Z952 Presence of prosthetic heart valve: Secondary | ICD-10-CM | POA: Diagnosis not present

## 2017-06-29 DIAGNOSIS — Z5181 Encounter for therapeutic drug level monitoring: Secondary | ICD-10-CM

## 2017-06-29 LAB — POCT INR: INR: 2.1

## 2017-07-10 DIAGNOSIS — R69 Illness, unspecified: Secondary | ICD-10-CM | POA: Diagnosis not present

## 2017-07-25 DIAGNOSIS — Z1389 Encounter for screening for other disorder: Secondary | ICD-10-CM | POA: Diagnosis not present

## 2017-07-25 DIAGNOSIS — I4891 Unspecified atrial fibrillation: Secondary | ICD-10-CM | POA: Diagnosis not present

## 2017-07-25 DIAGNOSIS — M109 Gout, unspecified: Secondary | ICD-10-CM | POA: Diagnosis not present

## 2017-07-25 DIAGNOSIS — I10 Essential (primary) hypertension: Secondary | ICD-10-CM | POA: Diagnosis not present

## 2017-07-25 DIAGNOSIS — E785 Hyperlipidemia, unspecified: Secondary | ICD-10-CM | POA: Diagnosis not present

## 2017-07-25 DIAGNOSIS — K5901 Slow transit constipation: Secondary | ICD-10-CM | POA: Diagnosis not present

## 2017-07-25 DIAGNOSIS — Z952 Presence of prosthetic heart valve: Secondary | ICD-10-CM | POA: Diagnosis not present

## 2017-07-25 DIAGNOSIS — Z1211 Encounter for screening for malignant neoplasm of colon: Secondary | ICD-10-CM | POA: Diagnosis not present

## 2017-07-25 DIAGNOSIS — Z0000 Encounter for general adult medical examination without abnormal findings: Secondary | ICD-10-CM | POA: Diagnosis not present

## 2017-07-30 ENCOUNTER — Ambulatory Visit (INDEPENDENT_AMBULATORY_CARE_PROVIDER_SITE_OTHER): Payer: Medicare HMO | Admitting: Pharmacist

## 2017-07-30 DIAGNOSIS — Z5181 Encounter for therapeutic drug level monitoring: Secondary | ICD-10-CM

## 2017-07-30 DIAGNOSIS — Z952 Presence of prosthetic heart valve: Secondary | ICD-10-CM

## 2017-07-30 LAB — POCT INR: INR: 2.4

## 2017-09-06 ENCOUNTER — Ambulatory Visit (INDEPENDENT_AMBULATORY_CARE_PROVIDER_SITE_OTHER): Payer: Medicare HMO | Admitting: Cardiology

## 2017-09-06 VITALS — BP 130/70 | HR 64 | Ht 61.0 in | Wt 164.8 lb

## 2017-09-06 DIAGNOSIS — I10 Essential (primary) hypertension: Secondary | ICD-10-CM | POA: Diagnosis not present

## 2017-09-06 DIAGNOSIS — Z952 Presence of prosthetic heart valve: Secondary | ICD-10-CM | POA: Diagnosis not present

## 2017-09-06 DIAGNOSIS — I6523 Occlusion and stenosis of bilateral carotid arteries: Secondary | ICD-10-CM

## 2017-09-06 DIAGNOSIS — I5032 Chronic diastolic (congestive) heart failure: Secondary | ICD-10-CM | POA: Diagnosis not present

## 2017-09-06 NOTE — Progress Notes (Signed)
[address] [place], [place]  [address] Phone: [phone] Fax:  [phone]  Date:  09/06/2017   ID:  Tracy Vargas, DOB [birth year], MRN [id number]  PCP:  Merri Brunette, MD   History of Present Illness: Tracy Vargas is a 83 y.o. female with hypertension, prior mechanical heart valve aortic in 1999 on chronic Coumadin here for follow up.  Previously she described that when laying her head on pillow, heart valve has felt "hazy" not as clear as usualy. When walking up incline or tying shoes feels SOB. Has several allergies. No strokes. No CP. No syncope. She is usually very active. No prior CAD.   Since starting the low dose lasix 20mg  PO QD, this has improved. Her BP has improved as well. Diastolic dysfunction noted on echo.   On 09/01/13 visit, fell. Upper lip still with ecchymosis. It was dark outside, tripped.  Bladder suspension - bridge. Lovenox. Rectal bleeding. Stable.  Optmologist left eye clot seen. Carotid artery U/S.   11/11/14 - Feels PVC's occasionally. Feels well otherwise.   09/06/17-overall doing very well, active.  She and her husband she states are quite blast with her health currently.  She was very pleased with her overall condition.  No chest pain, shortness of breath, syncope, bleeding.  Challenging for her to lose anymore weight.   Wt Readings from Last 3 Encounters:  09/06/17 164 lb 12.8 oz (74.8 kg)  08/14/16 163 lb 1.9 oz (74 kg)  07/17/16 159 lb 1.6 oz (72.2 kg)     Past Medical History:  Diagnosis Date  . Acquired hyperlipoproteinemia   . Chronic diastolic heart failure (HCC) 09/01/2013  . Diastolic dysfunction   . Dyspnea   . HTN (hypertension)   . Hyperlipidemia   . Osteopenia     Past Surgical History:  Procedure Laterality Date  . APPENDECTOMY    . CARDIAC VALVE REPLACEMENT    . CATARACT EXTRACTION    . ESOPHAGOGASTRODUODENOSCOPY N/A 07/14/2016   Procedure: ESOPHAGOGASTRODUODENOSCOPY (EGD);  Surgeon: Dorena Cookey, MD;   Location: [facility] ENDOSCOPY;  Service: Endoscopy;  Laterality: N/A;  . ROTATOR CUFF REPAIR    . TONSILLECTOMY      Current Outpatient Prescriptions  Medication Sig Dispense Refill  . amLODipine (NORVASC) 2.5 MG tablet Take 2.5 mg by mouth daily.    . Ascorbic Acid (VITAMIN C) 1000 MG tablet Take 1,000 mg by mouth daily.    Marland Kitchen atorvastatin (LIPITOR) 10 MG tablet Take 10 mg by mouth daily at 6 PM.     . b complex vitamins capsule Take 1 capsule by mouth daily.    . Biotin 5000 MCG TABS Take 1 tablet by mouth 3 (three) times a week.    . calcium citrate-vitamin D (CALCIUM CITRATE + D) 315-200 MG-UNIT tablet Take 2 tablets by mouth 2 (two) times daily.    . Cholecalciferol (VITAMIN D) 2000 units CAPS Take 1 capsule by mouth daily.    . Docosahexaenoic Acid-EPA (OMEGA-3) 180-270 MG CAPS Take 1 capsule by mouth daily.    Marland Kitchen docusate sodium (COLACE) 250 MG capsule Take 250 mg by mouth daily.    . furosemide (LASIX) 20 MG tablet Take 20 mg by mouth every 7 (seven) days. Take once a week per patient . No specific days    . Glucosamine-MSM-Hyaluronic Acd 750-375-30 MG TABS Take 2 tablets by mouth 2 (two) times daily.    Marland Kitchen lisinopril (PRINIVIL,ZESTRIL) 20 MG tablet Take 20 mg by  mouth daily.    . Magnesium (CVS TRIPLE MAGNESIUM COMPLEX) 400 MG CAPS Take 2 capsules by mouth daily.    . pantoprazole (PROTONIX) 40 MG tablet Take 40 mg by mouth as needed.    . Probiotic Product (SOLUBLE FIBER/PROBIOTICS PO) Take 2 tablets by mouth daily.    Marland Kitchen. senna (SENOKOT) 8.6 MG tablet Take 1 tablet by mouth as needed for constipation.    Marland Kitchen. warfarin (COUMADIN) 5 MG tablet Take 1 tablet daily except 1.5 tablets on Mondays and Thursday, or As Directed. 100 tablet 1  . zinc gluconate 50 MG tablet Take 50 mg by mouth daily.     No current facility-administered medications for this visit.     Allergies:    Allergies  Allergen Reactions  . Codeine     Halluciations  . Sudafed [Pseudoephedrine Hcl]     Halluications     Social History:  The patient  reports that she has never smoked. She has never used smokeless tobacco. She reports that she does not drink alcohol or use drugs.   ROS:  Please see the history of present illness.   No syncope, no bleeding, no orthopnea, no PND    PHYSICAL EXAM: VS:  BP 130/70   Pulse 64   Ht 5\' 1"  (1.549 m)   Wt 164 lb 12.8 oz (74.8 kg)   SpO2 99%   BMI 31.14 kg/m  GEN: Well nourished, well developed, in no acute distress  HEENT: normal  Neck: no JVD, carotid bruits, or masses Cardiac: RRR; sharp S2 click, 1/6 systolic murmur, no rubs, or gallops,no edema  Respiratory:  clear to auscultation bilaterally, normal work of breathing GI: soft, nontender, nondistended, + BS MS: no deformity or atrophy  Skin: warm and dry, no rash Neuro:  Alert and Oriented x 3, Strength and sensation are intact Psych: euthymic mood, full affect   EKG: Today 09/06/17-sinus rhythm 64 with no other abnormalities personally viewed-prior 11/11/14-sinus rhythm, PVC, heart rate 68, no other obvious abnormalities.-Prior xx6/4/14: Sinus rhythm rate 69 with PVC. EKG 04/09/13 shows sinus rhythm with rare PVC. Heart rate 69.  Echocardiogram 2014 demonstrated normal ejection fraction, mechanical aortic valve with upper normal peak velocity.  Carotid Dopplers 09/07/16: Heterogeneous plaque, bilaterally. 1-39% bilateral ICA stenosis. Patent vertebral arteries with antegrade flow. Normal subclavian arteries, bilaterally. F/u prn   ASSESSMENT AND PLAN:  1. Chronic diastolic heart failure-continuing with low-dose Lasix.  Has not had any issues recently.  Doing well.  Staying active. 2. History of mechanical aortic valve-stable. Prior echocardiogram reviewed. Chronic anticoagulation. 3. Hypertension- Overall good control. ACE inhibitor, Lasix, doing well.  At home she states that it is even lower. 4. Hyperlipidemia-dietary modification and atorvastatin low dose. Dr. Katrinka BlazingSmith is watching.  Last LDL was  89, HDL 67 in September 2018. 5. Left eye visual change-half of vision, laser surgery. ophthalmologist picked up occlusion left eye. Carotid Dopplers performed demonstrating bilateral mild carotid plaque.  Follow-up as needed. 6. Chronic anticoagulation-doing well. 7. 1 year follow up  Signed, Donato SchultzMark Monserratt Knezevic, MD [facility]  09/06/2017 10:02 AM

## 2017-09-06 NOTE — Patient Instructions (Signed)

## 2017-09-13 ENCOUNTER — Ambulatory Visit (INDEPENDENT_AMBULATORY_CARE_PROVIDER_SITE_OTHER): Payer: Medicare HMO | Admitting: Pharmacist

## 2017-09-13 DIAGNOSIS — Z952 Presence of prosthetic heart valve: Secondary | ICD-10-CM | POA: Diagnosis not present

## 2017-09-13 DIAGNOSIS — Z5181 Encounter for therapeutic drug level monitoring: Secondary | ICD-10-CM

## 2017-09-13 LAB — POCT INR: INR: 2

## 2017-10-23 ENCOUNTER — Ambulatory Visit (INDEPENDENT_AMBULATORY_CARE_PROVIDER_SITE_OTHER): Payer: Medicare HMO | Admitting: Pharmacist

## 2017-10-23 DIAGNOSIS — Z952 Presence of prosthetic heart valve: Secondary | ICD-10-CM

## 2017-10-23 DIAGNOSIS — Z5181 Encounter for therapeutic drug level monitoring: Secondary | ICD-10-CM | POA: Diagnosis not present

## 2017-10-23 LAB — POCT INR: INR: 1.9

## 2017-10-23 NOTE — Patient Instructions (Signed)
Take an extra 1/2 tablet today, then continue on same dosage 1 tablet daily except take 1/2 tablet on Mondays, Wednesdays, and Fridays.   Recheck INR in 6 weeks.  Call  Coumadin Clinic with any questions [phone]. Continue intake of greens as usual and is ok to drink 1-2 cups of green tea.

## 2017-11-01 DIAGNOSIS — M5136 Other intervertebral disc degeneration, lumbar region: Secondary | ICD-10-CM | POA: Diagnosis not present

## 2017-11-01 DIAGNOSIS — M5134 Other intervertebral disc degeneration, thoracic region: Secondary | ICD-10-CM | POA: Diagnosis not present

## 2017-11-01 DIAGNOSIS — M9904 Segmental and somatic dysfunction of sacral region: Secondary | ICD-10-CM | POA: Diagnosis not present

## 2017-11-01 DIAGNOSIS — M5432 Sciatica, left side: Secondary | ICD-10-CM | POA: Diagnosis not present

## 2017-11-01 DIAGNOSIS — M9902 Segmental and somatic dysfunction of thoracic region: Secondary | ICD-10-CM | POA: Diagnosis not present

## 2017-11-01 DIAGNOSIS — M9903 Segmental and somatic dysfunction of lumbar region: Secondary | ICD-10-CM | POA: Diagnosis not present

## 2017-11-12 DIAGNOSIS — M9902 Segmental and somatic dysfunction of thoracic region: Secondary | ICD-10-CM | POA: Diagnosis not present

## 2017-11-12 DIAGNOSIS — M5134 Other intervertebral disc degeneration, thoracic region: Secondary | ICD-10-CM | POA: Diagnosis not present

## 2017-11-12 DIAGNOSIS — M9904 Segmental and somatic dysfunction of sacral region: Secondary | ICD-10-CM | POA: Diagnosis not present

## 2017-11-12 DIAGNOSIS — M5432 Sciatica, left side: Secondary | ICD-10-CM | POA: Diagnosis not present

## 2017-11-12 DIAGNOSIS — M9903 Segmental and somatic dysfunction of lumbar region: Secondary | ICD-10-CM | POA: Diagnosis not present

## 2017-11-12 DIAGNOSIS — M5136 Other intervertebral disc degeneration, lumbar region: Secondary | ICD-10-CM | POA: Diagnosis not present

## 2017-11-15 DIAGNOSIS — M9903 Segmental and somatic dysfunction of lumbar region: Secondary | ICD-10-CM | POA: Diagnosis not present

## 2017-11-15 DIAGNOSIS — Z7901 Long term (current) use of anticoagulants: Secondary | ICD-10-CM | POA: Diagnosis not present

## 2017-11-15 DIAGNOSIS — Z6831 Body mass index (BMI) 31.0-31.9, adult: Secondary | ICD-10-CM | POA: Diagnosis not present

## 2017-11-15 DIAGNOSIS — Z954 Presence of other heart-valve replacement: Secondary | ICD-10-CM | POA: Diagnosis not present

## 2017-11-15 DIAGNOSIS — E7800 Pure hypercholesterolemia, unspecified: Secondary | ICD-10-CM | POA: Diagnosis not present

## 2017-11-15 DIAGNOSIS — E669 Obesity, unspecified: Secondary | ICD-10-CM | POA: Diagnosis not present

## 2017-11-15 DIAGNOSIS — M9904 Segmental and somatic dysfunction of sacral region: Secondary | ICD-10-CM | POA: Diagnosis not present

## 2017-11-15 DIAGNOSIS — I313 Pericardial effusion (noninflammatory): Secondary | ICD-10-CM | POA: Diagnosis not present

## 2017-11-15 DIAGNOSIS — M9902 Segmental and somatic dysfunction of thoracic region: Secondary | ICD-10-CM | POA: Diagnosis not present

## 2017-11-15 DIAGNOSIS — M5136 Other intervertebral disc degeneration, lumbar region: Secondary | ICD-10-CM | POA: Diagnosis not present

## 2017-11-15 DIAGNOSIS — M5134 Other intervertebral disc degeneration, thoracic region: Secondary | ICD-10-CM | POA: Diagnosis not present

## 2017-11-15 DIAGNOSIS — Z8249 Family history of ischemic heart disease and other diseases of the circulatory system: Secondary | ICD-10-CM | POA: Diagnosis not present

## 2017-11-15 DIAGNOSIS — Z885 Allergy status to narcotic agent status: Secondary | ICD-10-CM | POA: Diagnosis not present

## 2017-11-15 DIAGNOSIS — I10 Essential (primary) hypertension: Secondary | ICD-10-CM | POA: Diagnosis not present

## 2017-11-15 DIAGNOSIS — M5432 Sciatica, left side: Secondary | ICD-10-CM | POA: Diagnosis not present

## 2017-11-29 DIAGNOSIS — M9904 Segmental and somatic dysfunction of sacral region: Secondary | ICD-10-CM | POA: Diagnosis not present

## 2017-11-29 DIAGNOSIS — M5432 Sciatica, left side: Secondary | ICD-10-CM | POA: Diagnosis not present

## 2017-11-29 DIAGNOSIS — M5136 Other intervertebral disc degeneration, lumbar region: Secondary | ICD-10-CM | POA: Diagnosis not present

## 2017-11-29 DIAGNOSIS — M9902 Segmental and somatic dysfunction of thoracic region: Secondary | ICD-10-CM | POA: Diagnosis not present

## 2017-11-29 DIAGNOSIS — M9903 Segmental and somatic dysfunction of lumbar region: Secondary | ICD-10-CM | POA: Diagnosis not present

## 2017-11-29 DIAGNOSIS — M5134 Other intervertebral disc degeneration, thoracic region: Secondary | ICD-10-CM | POA: Diagnosis not present

## 2017-12-04 ENCOUNTER — Ambulatory Visit (INDEPENDENT_AMBULATORY_CARE_PROVIDER_SITE_OTHER): Payer: Medicare HMO | Admitting: *Deleted

## 2017-12-04 DIAGNOSIS — Z952 Presence of prosthetic heart valve: Secondary | ICD-10-CM | POA: Diagnosis not present

## 2017-12-04 DIAGNOSIS — Z5181 Encounter for therapeutic drug level monitoring: Secondary | ICD-10-CM | POA: Diagnosis not present

## 2017-12-04 LAB — POCT INR: INR: 1.8

## 2017-12-04 NOTE — Patient Instructions (Signed)
Description   Take an extra 1/2 tablet today, then change dose to 1 tablet daily except take 1/2 tablet on Wednesdays and Fridays.   Recheck INR in 2 weeks.  Call  Coumadin Clinic with any questions [phone]. Continue intake of greens as usual and is ok to drink 1-2 cups of green tea.

## 2017-12-19 ENCOUNTER — Ambulatory Visit (INDEPENDENT_AMBULATORY_CARE_PROVIDER_SITE_OTHER): Payer: Medicare HMO | Admitting: *Deleted

## 2017-12-19 DIAGNOSIS — Z5181 Encounter for therapeutic drug level monitoring: Secondary | ICD-10-CM

## 2017-12-19 DIAGNOSIS — Z952 Presence of prosthetic heart valve: Secondary | ICD-10-CM

## 2017-12-19 LAB — POCT INR: INR: 2.4

## 2017-12-19 NOTE — Patient Instructions (Signed)
Description   Continue taking 1 tablet daily except take 1/2 tablet on Wednesdays and Fridays.  Recheck INR in 3 weeks.  Call Coumadin Clinic with any questions [phone]. Continue intake of greens as usual and is ok to drink 1-2 cups of green tea.

## 2017-12-20 DIAGNOSIS — M9904 Segmental and somatic dysfunction of sacral region: Secondary | ICD-10-CM | POA: Diagnosis not present

## 2017-12-20 DIAGNOSIS — M5432 Sciatica, left side: Secondary | ICD-10-CM | POA: Diagnosis not present

## 2017-12-20 DIAGNOSIS — M5134 Other intervertebral disc degeneration, thoracic region: Secondary | ICD-10-CM | POA: Diagnosis not present

## 2017-12-20 DIAGNOSIS — M9903 Segmental and somatic dysfunction of lumbar region: Secondary | ICD-10-CM | POA: Diagnosis not present

## 2017-12-20 DIAGNOSIS — M9902 Segmental and somatic dysfunction of thoracic region: Secondary | ICD-10-CM | POA: Diagnosis not present

## 2017-12-20 DIAGNOSIS — M5136 Other intervertebral disc degeneration, lumbar region: Secondary | ICD-10-CM | POA: Diagnosis not present

## 2017-12-27 DIAGNOSIS — M5432 Sciatica, left side: Secondary | ICD-10-CM | POA: Diagnosis not present

## 2017-12-27 DIAGNOSIS — M9903 Segmental and somatic dysfunction of lumbar region: Secondary | ICD-10-CM | POA: Diagnosis not present

## 2017-12-27 DIAGNOSIS — M5134 Other intervertebral disc degeneration, thoracic region: Secondary | ICD-10-CM | POA: Diagnosis not present

## 2017-12-27 DIAGNOSIS — M5136 Other intervertebral disc degeneration, lumbar region: Secondary | ICD-10-CM | POA: Diagnosis not present

## 2017-12-27 DIAGNOSIS — M9904 Segmental and somatic dysfunction of sacral region: Secondary | ICD-10-CM | POA: Diagnosis not present

## 2017-12-27 DIAGNOSIS — M9902 Segmental and somatic dysfunction of thoracic region: Secondary | ICD-10-CM | POA: Diagnosis not present

## 2018-01-03 DIAGNOSIS — M5136 Other intervertebral disc degeneration, lumbar region: Secondary | ICD-10-CM | POA: Diagnosis not present

## 2018-01-03 DIAGNOSIS — M5134 Other intervertebral disc degeneration, thoracic region: Secondary | ICD-10-CM | POA: Diagnosis not present

## 2018-01-03 DIAGNOSIS — M5432 Sciatica, left side: Secondary | ICD-10-CM | POA: Diagnosis not present

## 2018-01-03 DIAGNOSIS — M9902 Segmental and somatic dysfunction of thoracic region: Secondary | ICD-10-CM | POA: Diagnosis not present

## 2018-01-03 DIAGNOSIS — M9904 Segmental and somatic dysfunction of sacral region: Secondary | ICD-10-CM | POA: Diagnosis not present

## 2018-01-03 DIAGNOSIS — M9903 Segmental and somatic dysfunction of lumbar region: Secondary | ICD-10-CM | POA: Diagnosis not present

## 2018-01-09 ENCOUNTER — Ambulatory Visit (INDEPENDENT_AMBULATORY_CARE_PROVIDER_SITE_OTHER): Payer: Medicare HMO | Admitting: *Deleted

## 2018-01-09 DIAGNOSIS — Z5181 Encounter for therapeutic drug level monitoring: Secondary | ICD-10-CM | POA: Diagnosis not present

## 2018-01-09 DIAGNOSIS — Z952 Presence of prosthetic heart valve: Secondary | ICD-10-CM | POA: Diagnosis not present

## 2018-01-09 LAB — POCT INR: INR: 2.4

## 2018-01-09 NOTE — Patient Instructions (Signed)
Description   Continue taking 1 tablet daily except take 1/2 tablet on Wednesdays and Fridays.  Recheck INR in 4 weeks.  Call Coumadin Clinic with any questions [phone]. Continue intake of greens as usual and is ok to drink 1-2 cups of green tea.

## 2018-01-10 DIAGNOSIS — M5134 Other intervertebral disc degeneration, thoracic region: Secondary | ICD-10-CM | POA: Diagnosis not present

## 2018-01-10 DIAGNOSIS — M9904 Segmental and somatic dysfunction of sacral region: Secondary | ICD-10-CM | POA: Diagnosis not present

## 2018-01-10 DIAGNOSIS — M9902 Segmental and somatic dysfunction of thoracic region: Secondary | ICD-10-CM | POA: Diagnosis not present

## 2018-01-10 DIAGNOSIS — M9903 Segmental and somatic dysfunction of lumbar region: Secondary | ICD-10-CM | POA: Diagnosis not present

## 2018-01-10 DIAGNOSIS — M5432 Sciatica, left side: Secondary | ICD-10-CM | POA: Diagnosis not present

## 2018-01-10 DIAGNOSIS — M5136 Other intervertebral disc degeneration, lumbar region: Secondary | ICD-10-CM | POA: Diagnosis not present

## 2018-02-01 ENCOUNTER — Other Ambulatory Visit: Payer: Self-pay | Admitting: Cardiology

## 2018-02-05 DIAGNOSIS — K5900 Constipation, unspecified: Secondary | ICD-10-CM | POA: Diagnosis not present

## 2018-02-05 DIAGNOSIS — I10 Essential (primary) hypertension: Secondary | ICD-10-CM | POA: Diagnosis not present

## 2018-02-05 DIAGNOSIS — M85851 Other specified disorders of bone density and structure, right thigh: Secondary | ICD-10-CM | POA: Diagnosis not present

## 2018-02-05 DIAGNOSIS — R143 Flatulence: Secondary | ICD-10-CM | POA: Diagnosis not present

## 2018-02-05 DIAGNOSIS — E7800 Pure hypercholesterolemia, unspecified: Secondary | ICD-10-CM | POA: Diagnosis not present

## 2018-02-06 ENCOUNTER — Ambulatory Visit (INDEPENDENT_AMBULATORY_CARE_PROVIDER_SITE_OTHER): Payer: Medicare HMO | Admitting: *Deleted

## 2018-02-06 DIAGNOSIS — Z952 Presence of prosthetic heart valve: Secondary | ICD-10-CM

## 2018-02-06 DIAGNOSIS — Z5181 Encounter for therapeutic drug level monitoring: Secondary | ICD-10-CM | POA: Diagnosis not present

## 2018-02-06 DIAGNOSIS — Z1211 Encounter for screening for malignant neoplasm of colon: Secondary | ICD-10-CM | POA: Diagnosis not present

## 2018-02-06 LAB — POCT INR: INR: 1.8

## 2018-02-06 NOTE — Patient Instructions (Signed)
Description   Today take and extra 1/2  tablet, then Continue taking 1 tablet daily except take 1/2 tablet on Mondays and Wednesdays.   Recheck INR in 3 weeks.  Call Coumadin Clinic with any questions [phone]. Continue intake of greens as usual and is ok to drink 1-2 cups of green tea.

## 2018-02-07 DIAGNOSIS — M5134 Other intervertebral disc degeneration, thoracic region: Secondary | ICD-10-CM | POA: Diagnosis not present

## 2018-02-07 DIAGNOSIS — M5432 Sciatica, left side: Secondary | ICD-10-CM | POA: Diagnosis not present

## 2018-02-07 DIAGNOSIS — M9902 Segmental and somatic dysfunction of thoracic region: Secondary | ICD-10-CM | POA: Diagnosis not present

## 2018-02-07 DIAGNOSIS — M5136 Other intervertebral disc degeneration, lumbar region: Secondary | ICD-10-CM | POA: Diagnosis not present

## 2018-02-07 DIAGNOSIS — M9904 Segmental and somatic dysfunction of sacral region: Secondary | ICD-10-CM | POA: Diagnosis not present

## 2018-02-07 DIAGNOSIS — M9903 Segmental and somatic dysfunction of lumbar region: Secondary | ICD-10-CM | POA: Diagnosis not present

## 2018-02-26 ENCOUNTER — Ambulatory Visit (INDEPENDENT_AMBULATORY_CARE_PROVIDER_SITE_OTHER): Payer: Medicare HMO | Admitting: *Deleted

## 2018-02-26 DIAGNOSIS — Z5181 Encounter for therapeutic drug level monitoring: Secondary | ICD-10-CM | POA: Diagnosis not present

## 2018-02-26 DIAGNOSIS — Z952 Presence of prosthetic heart valve: Secondary | ICD-10-CM | POA: Diagnosis not present

## 2018-02-26 LAB — POCT INR: INR: 1.4

## 2018-02-26 NOTE — Patient Instructions (Signed)
Description   Today April 23rd take 1 and 1/2 tablets (7.5mg ) then tomorrow April 24th take 1 tablet (5mg ) then change coumadin dose to  1 tablet (5mg )  daily except take 1/2 tablet only on  Wednesdays.   Recheck INR in 1 week.  Call Coumadin Clinic with any questions [phone].

## 2018-03-06 ENCOUNTER — Ambulatory Visit (INDEPENDENT_AMBULATORY_CARE_PROVIDER_SITE_OTHER): Payer: Medicare HMO | Admitting: *Deleted

## 2018-03-06 DIAGNOSIS — Z5181 Encounter for therapeutic drug level monitoring: Secondary | ICD-10-CM

## 2018-03-06 DIAGNOSIS — Z952 Presence of prosthetic heart valve: Secondary | ICD-10-CM | POA: Diagnosis not present

## 2018-03-06 LAB — POCT INR: INR: 2.4

## 2018-03-06 NOTE — Patient Instructions (Signed)
Description   Continue same dose of  coumadin  1 tablet ( )  daily except take 1/2 tablet only on  Wednesdays.   Recheck INR in 2 weeks.  Call Coumadin Clinic with any questions [phone].

## 2018-03-07 DIAGNOSIS — M5432 Sciatica, left side: Secondary | ICD-10-CM | POA: Diagnosis not present

## 2018-03-07 DIAGNOSIS — M9902 Segmental and somatic dysfunction of thoracic region: Secondary | ICD-10-CM | POA: Diagnosis not present

## 2018-03-07 DIAGNOSIS — M5136 Other intervertebral disc degeneration, lumbar region: Secondary | ICD-10-CM | POA: Diagnosis not present

## 2018-03-07 DIAGNOSIS — M9904 Segmental and somatic dysfunction of sacral region: Secondary | ICD-10-CM | POA: Diagnosis not present

## 2018-03-07 DIAGNOSIS — M5134 Other intervertebral disc degeneration, thoracic region: Secondary | ICD-10-CM | POA: Diagnosis not present

## 2018-03-07 DIAGNOSIS — M9903 Segmental and somatic dysfunction of lumbar region: Secondary | ICD-10-CM | POA: Diagnosis not present

## 2018-03-18 ENCOUNTER — Ambulatory Visit (INDEPENDENT_AMBULATORY_CARE_PROVIDER_SITE_OTHER): Payer: Medicare HMO | Admitting: *Deleted

## 2018-03-18 DIAGNOSIS — Z952 Presence of prosthetic heart valve: Secondary | ICD-10-CM

## 2018-03-18 DIAGNOSIS — Z5181 Encounter for therapeutic drug level monitoring: Secondary | ICD-10-CM

## 2018-03-18 LAB — POCT INR: INR: 3.2

## 2018-03-18 NOTE — Patient Instructions (Signed)
Description   Do not take any Coumadin today then continue same dose of  coumadin  1 tablet ( )  daily except take 1/2 tablet only on  Wednesdays.   Recheck INR in 2 weeks.  Call Coumadin Clinic with any questions [phone].

## 2018-04-03 ENCOUNTER — Ambulatory Visit: Payer: Medicare HMO | Admitting: *Deleted

## 2018-04-03 DIAGNOSIS — Z952 Presence of prosthetic heart valve: Secondary | ICD-10-CM

## 2018-04-03 DIAGNOSIS — Z5181 Encounter for therapeutic drug level monitoring: Secondary | ICD-10-CM | POA: Diagnosis not present

## 2018-04-03 LAB — POCT INR: INR: 2.5 (ref 2.0–3.0)

## 2018-04-03 NOTE — Patient Instructions (Signed)
Description   Continue same dose of  coumadin  1 tablet ( )  daily except take 1/2 tablet only on  Wednesdays.   Recheck INR in 3 weeks.  Call Coumadin Clinic with any questions [phone].

## 2018-04-17 DIAGNOSIS — M9902 Segmental and somatic dysfunction of thoracic region: Secondary | ICD-10-CM | POA: Diagnosis not present

## 2018-04-17 DIAGNOSIS — M9903 Segmental and somatic dysfunction of lumbar region: Secondary | ICD-10-CM | POA: Diagnosis not present

## 2018-04-17 DIAGNOSIS — M5134 Other intervertebral disc degeneration, thoracic region: Secondary | ICD-10-CM | POA: Diagnosis not present

## 2018-04-17 DIAGNOSIS — M5136 Other intervertebral disc degeneration, lumbar region: Secondary | ICD-10-CM | POA: Diagnosis not present

## 2018-04-17 DIAGNOSIS — M5432 Sciatica, left side: Secondary | ICD-10-CM | POA: Diagnosis not present

## 2018-04-17 DIAGNOSIS — M9904 Segmental and somatic dysfunction of sacral region: Secondary | ICD-10-CM | POA: Diagnosis not present

## 2018-04-24 DIAGNOSIS — M5432 Sciatica, left side: Secondary | ICD-10-CM | POA: Diagnosis not present

## 2018-04-24 DIAGNOSIS — M9902 Segmental and somatic dysfunction of thoracic region: Secondary | ICD-10-CM | POA: Diagnosis not present

## 2018-04-24 DIAGNOSIS — M5134 Other intervertebral disc degeneration, thoracic region: Secondary | ICD-10-CM | POA: Diagnosis not present

## 2018-04-24 DIAGNOSIS — M9903 Segmental and somatic dysfunction of lumbar region: Secondary | ICD-10-CM | POA: Diagnosis not present

## 2018-04-24 DIAGNOSIS — M9904 Segmental and somatic dysfunction of sacral region: Secondary | ICD-10-CM | POA: Diagnosis not present

## 2018-04-24 DIAGNOSIS — M5136 Other intervertebral disc degeneration, lumbar region: Secondary | ICD-10-CM | POA: Diagnosis not present

## 2018-04-25 ENCOUNTER — Ambulatory Visit: Payer: Medicare HMO

## 2018-04-25 DIAGNOSIS — Z5181 Encounter for therapeutic drug level monitoring: Secondary | ICD-10-CM

## 2018-04-25 DIAGNOSIS — Z952 Presence of prosthetic heart valve: Secondary | ICD-10-CM | POA: Diagnosis not present

## 2018-04-25 LAB — POCT INR: INR: 4.1 — AB (ref 2.0–3.0)

## 2018-04-25 NOTE — Patient Instructions (Signed)
Description   Skip tomorrow's dosage of Coumadin, then start taking 1 tablet daily except take 1/2 tablet on  Wednesdays and Saturdays.   Recheck INR in 2 weeks.  Call Coumadin Clinic with any questions [phone].

## 2018-05-10 ENCOUNTER — Ambulatory Visit: Payer: Medicare HMO | Admitting: *Deleted

## 2018-05-10 DIAGNOSIS — Z5181 Encounter for therapeutic drug level monitoring: Secondary | ICD-10-CM

## 2018-05-10 DIAGNOSIS — Z952 Presence of prosthetic heart valve: Secondary | ICD-10-CM | POA: Diagnosis not present

## 2018-05-10 LAB — POCT INR: INR: 2.7 (ref 2.0–3.0)

## 2018-05-10 NOTE — Patient Instructions (Signed)
Description   Eat a large serving of leafy green vegetable today. Continue  taking 1 tablet daily except take 1/2 tablet on  Wednesdays and Saturdays.   Recheck INR in 2 weeks.  Call Coumadin Clinic with any questions [phone].

## 2018-05-16 DIAGNOSIS — M5432 Sciatica, left side: Secondary | ICD-10-CM | POA: Diagnosis not present

## 2018-05-16 DIAGNOSIS — M9903 Segmental and somatic dysfunction of lumbar region: Secondary | ICD-10-CM | POA: Diagnosis not present

## 2018-05-16 DIAGNOSIS — M5136 Other intervertebral disc degeneration, lumbar region: Secondary | ICD-10-CM | POA: Diagnosis not present

## 2018-05-16 DIAGNOSIS — M9904 Segmental and somatic dysfunction of sacral region: Secondary | ICD-10-CM | POA: Diagnosis not present

## 2018-05-16 DIAGNOSIS — M9902 Segmental and somatic dysfunction of thoracic region: Secondary | ICD-10-CM | POA: Diagnosis not present

## 2018-05-16 DIAGNOSIS — M5134 Other intervertebral disc degeneration, thoracic region: Secondary | ICD-10-CM | POA: Diagnosis not present

## 2018-05-24 ENCOUNTER — Ambulatory Visit: Payer: Medicare HMO | Admitting: *Deleted

## 2018-05-24 DIAGNOSIS — Z952 Presence of prosthetic heart valve: Secondary | ICD-10-CM

## 2018-05-24 DIAGNOSIS — Z5181 Encounter for therapeutic drug level monitoring: Secondary | ICD-10-CM | POA: Diagnosis not present

## 2018-05-24 LAB — POCT INR: INR: 2.4 (ref 2.0–3.0)

## 2018-05-24 NOTE — Patient Instructions (Signed)
Description   Continue  taking 1 tablet daily except take 1/2 tablet on  Wednesdays and Saturdays.   Recheck INR in 2 weeks.  Call Coumadin Clinic with any questions [phone].

## 2018-06-07 ENCOUNTER — Ambulatory Visit: Payer: Medicare HMO | Admitting: *Deleted

## 2018-06-07 DIAGNOSIS — Z5181 Encounter for therapeutic drug level monitoring: Secondary | ICD-10-CM

## 2018-06-07 DIAGNOSIS — Z952 Presence of prosthetic heart valve: Secondary | ICD-10-CM | POA: Diagnosis not present

## 2018-06-07 LAB — POCT INR: INR: 3 (ref 2.0–3.0)

## 2018-06-07 NOTE — Patient Instructions (Signed)
Description   Skip tomorrow's dose, then Continue  taking 1 tablet daily except take 1/2 tablet on  Wednesdays and Saturdays.   Recheck INR in 2 weeks.  Call Coumadin Clinic with any questions [phone].

## 2018-06-20 ENCOUNTER — Ambulatory Visit: Payer: Medicare HMO

## 2018-06-20 DIAGNOSIS — Z5181 Encounter for therapeutic drug level monitoring: Secondary | ICD-10-CM

## 2018-06-20 DIAGNOSIS — Z952 Presence of prosthetic heart valve: Secondary | ICD-10-CM

## 2018-06-20 LAB — POCT INR: INR: 2.6 (ref 2.0–3.0)

## 2018-06-20 NOTE — Patient Instructions (Signed)
Description   Take 1/2 tablet tomorrow, then resume same dosage 1 tablet daily except take 1/2 tablet on  Wednesdays and Saturdays.   Recheck INR in 3 weeks.  Call Coumadin Clinic with any questions [phone].

## 2018-07-11 ENCOUNTER — Ambulatory Visit: Payer: Medicare HMO | Admitting: *Deleted

## 2018-07-11 DIAGNOSIS — Z952 Presence of prosthetic heart valve: Secondary | ICD-10-CM

## 2018-07-11 DIAGNOSIS — Z5181 Encounter for therapeutic drug level monitoring: Secondary | ICD-10-CM

## 2018-07-11 LAB — POCT INR: INR: 3.1 — AB (ref 2.0–3.0)

## 2018-07-11 NOTE — Patient Instructions (Signed)
Description   Skip tomorrow's dose, then change your dose to 1 tablet daily except take 1/2 tablet on Mondays,  Wednesdays and Fridays   Recheck INR in 2 weeks.  Call Coumadin Clinic with any questions [phone].

## 2018-07-18 DIAGNOSIS — Z961 Presence of intraocular lens: Secondary | ICD-10-CM | POA: Diagnosis not present

## 2018-07-18 DIAGNOSIS — H5212 Myopia, left eye: Secondary | ICD-10-CM | POA: Diagnosis not present

## 2018-07-18 DIAGNOSIS — H5201 Hypermetropia, right eye: Secondary | ICD-10-CM | POA: Diagnosis not present

## 2018-07-18 DIAGNOSIS — H04123 Dry eye syndrome of bilateral lacrimal glands: Secondary | ICD-10-CM | POA: Diagnosis not present

## 2018-07-25 ENCOUNTER — Ambulatory Visit: Payer: Medicare HMO | Admitting: *Deleted

## 2018-07-25 DIAGNOSIS — Z5181 Encounter for therapeutic drug level monitoring: Secondary | ICD-10-CM | POA: Diagnosis not present

## 2018-07-25 DIAGNOSIS — Z952 Presence of prosthetic heart valve: Secondary | ICD-10-CM | POA: Diagnosis not present

## 2018-07-25 LAB — POCT INR: INR: 2.1 (ref 2.0–3.0)

## 2018-07-25 NOTE — Patient Instructions (Signed)
Description   Continue taking 1 tablet daily except take 1/2 tablet on Mondays,  Wednesdays and Fridays   Recheck INR in 2 weeks.  Call Coumadin Clinic with any questions [phone].

## 2018-07-29 ENCOUNTER — Other Ambulatory Visit: Payer: Self-pay | Admitting: Cardiology

## 2018-08-07 ENCOUNTER — Ambulatory Visit: Payer: Medicare HMO | Admitting: *Deleted

## 2018-08-07 DIAGNOSIS — Z952 Presence of prosthetic heart valve: Secondary | ICD-10-CM

## 2018-08-07 DIAGNOSIS — Z5181 Encounter for therapeutic drug level monitoring: Secondary | ICD-10-CM

## 2018-08-07 LAB — POCT INR: INR: 2.3 (ref 2.0–3.0)

## 2018-08-07 NOTE — Patient Instructions (Addendum)
  Description   Continue taking 1 tablet daily except take 1/2 tablet on Mondays,  Wednesdays and Fridays   Recheck INR in 4 weeks.  Call Coumadin Clinic with any questions [phone].

## 2018-08-08 DIAGNOSIS — Z1389 Encounter for screening for other disorder: Secondary | ICD-10-CM | POA: Diagnosis not present

## 2018-08-08 DIAGNOSIS — Z1211 Encounter for screening for malignant neoplasm of colon: Secondary | ICD-10-CM | POA: Diagnosis not present

## 2018-08-08 DIAGNOSIS — Z7901 Long term (current) use of anticoagulants: Secondary | ICD-10-CM | POA: Diagnosis not present

## 2018-08-08 DIAGNOSIS — I10 Essential (primary) hypertension: Secondary | ICD-10-CM | POA: Diagnosis not present

## 2018-08-08 DIAGNOSIS — Z1239 Encounter for other screening for malignant neoplasm of breast: Secondary | ICD-10-CM | POA: Diagnosis not present

## 2018-08-08 DIAGNOSIS — I4891 Unspecified atrial fibrillation: Secondary | ICD-10-CM | POA: Diagnosis not present

## 2018-08-08 DIAGNOSIS — E669 Obesity, unspecified: Secondary | ICD-10-CM | POA: Diagnosis not present

## 2018-08-08 DIAGNOSIS — E7800 Pure hypercholesterolemia, unspecified: Secondary | ICD-10-CM | POA: Diagnosis not present

## 2018-08-08 DIAGNOSIS — Z0001 Encounter for general adult medical examination with abnormal findings: Secondary | ICD-10-CM | POA: Diagnosis not present

## 2018-08-08 DIAGNOSIS — K219 Gastro-esophageal reflux disease without esophagitis: Secondary | ICD-10-CM | POA: Diagnosis not present

## 2018-08-09 ENCOUNTER — Other Ambulatory Visit: Payer: Self-pay | Admitting: Family Medicine

## 2018-08-09 DIAGNOSIS — R5381 Other malaise: Secondary | ICD-10-CM

## 2018-08-09 DIAGNOSIS — Z1231 Encounter for screening mammogram for malignant neoplasm of breast: Secondary | ICD-10-CM

## 2018-08-12 ENCOUNTER — Other Ambulatory Visit: Payer: Self-pay | Admitting: Family Medicine

## 2018-08-12 DIAGNOSIS — M8580 Other specified disorders of bone density and structure, unspecified site: Secondary | ICD-10-CM

## 2018-08-19 DIAGNOSIS — M4180 Other forms of scoliosis, site unspecified: Secondary | ICD-10-CM | POA: Diagnosis not present

## 2018-08-19 DIAGNOSIS — M48061 Spinal stenosis, lumbar region without neurogenic claudication: Secondary | ICD-10-CM | POA: Diagnosis not present

## 2018-08-19 DIAGNOSIS — M48062 Spinal stenosis, lumbar region with neurogenic claudication: Secondary | ICD-10-CM | POA: Diagnosis not present

## 2018-08-19 DIAGNOSIS — M545 Low back pain: Secondary | ICD-10-CM | POA: Diagnosis not present

## 2018-09-04 ENCOUNTER — Ambulatory Visit: Payer: Medicare HMO | Admitting: *Deleted

## 2018-09-04 DIAGNOSIS — Z5181 Encounter for therapeutic drug level monitoring: Secondary | ICD-10-CM

## 2018-09-04 DIAGNOSIS — Z952 Presence of prosthetic heart valve: Secondary | ICD-10-CM | POA: Diagnosis not present

## 2018-09-04 LAB — POCT INR: INR: 2.4 (ref 2.0–3.0)

## 2018-09-04 NOTE — Patient Instructions (Signed)
Description   Continue taking 1 tablet daily except take 1/2 tablet on Mondays,  Wednesdays and Fridays   Recheck INR in 4 weeks.  Call Coumadin Clinic with any questions [phone].

## 2018-09-10 ENCOUNTER — Ambulatory Visit: Payer: Medicare HMO | Admitting: Cardiology

## 2018-09-10 ENCOUNTER — Encounter: Payer: Self-pay | Admitting: Cardiology

## 2018-09-10 VITALS — BP 142/68 | HR 64 | Ht 61.0 in | Wt 161.6 lb

## 2018-09-10 DIAGNOSIS — I6523 Occlusion and stenosis of bilateral carotid arteries: Secondary | ICD-10-CM | POA: Diagnosis not present

## 2018-09-10 DIAGNOSIS — I10 Essential (primary) hypertension: Secondary | ICD-10-CM

## 2018-09-10 DIAGNOSIS — I5032 Chronic diastolic (congestive) heart failure: Secondary | ICD-10-CM

## 2018-09-10 DIAGNOSIS — Z952 Presence of prosthetic heart valve: Secondary | ICD-10-CM

## 2018-09-10 NOTE — Patient Instructions (Signed)
Medication Instructions:  The current medical regimen is effective;  continue present plan and medications.  If you need a refill on your cardiac medications before your next appointment, please call your pharmacy.   Follow-Up: At [facility], you and your health needs are our priority.  As part of our continuing mission to provide you with exceptional heart care, we have created designated Provider Care Teams.  These Care Teams include your primary Cardiologist (physician) and Advanced Practice Providers (APPs -  Physician Assistants and Nurse Practitioners) who all work together to provide you with the care you need, when you need it. You will need a follow up appointment in 12 months.  Please call our office 2 months in advance to schedule this appointment.  You may see Dr Mark Skains. or one of the following Advanced Practice Providers on your designated Care Team:   Lori Gerhardt, NP Laura Ingold, NP . Jill McDaniel, NP  Thank you for choosing [facility]!!      

## 2018-09-10 NOTE — Progress Notes (Signed)
Cardiology Office Note:    Date:  09/10/2018   ID:  Tracy Vargas, DOB 01/30/1934, MRN [id number]  PCP:  Merri Brunette, MD  Cardiologist:  Donato Schultz, MD  Electrophysiologist:  None   Referring MD: Merri Brunette, MD     History of Present Illness:    Tracy Vargas is a 84 y.o. female here for the follow-up of mechanical heart valve placed in 1999 on chronic anticoagulation with Coumadin.  At one point, low-dose Lasix was administered and shortness of breath seemed to improve.  Diastolic dysfunction noted previously on echocardiogram reviewed.  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  Overall she is doing quite well but she is anxious about her husband who since the spring 2019 started to have health problems.  She denies any chest pain fevers chills nausea vomiting syncope bleeding.  Past Medical History:  Diagnosis Date  . Acquired hyperlipoproteinemia   . Chronic diastolic heart failure (HCC) 09/01/2013  . Diastolic dysfunction   . Dyspnea   . HTN (hypertension)   . Hyperlipidemia   . Osteopenia     Past Surgical History:  Procedure Laterality Date  . APPENDECTOMY    . CARDIAC VALVE REPLACEMENT    . CATARACT EXTRACTION    . ESOPHAGOGASTRODUODENOSCOPY N/A 07/14/2016   Procedure: ESOPHAGOGASTRODUODENOSCOPY (EGD);  Surgeon: Dorena Cookey, MD;  Location: [facility] ENDOSCOPY;  Service: Endoscopy;  Laterality: N/A;  . ROTATOR CUFF REPAIR    . TONSILLECTOMY      Current Medications: Current Meds  Medication Sig  . amLODipine (NORVASC) 2.5 MG tablet Take 2.5 mg by mouth daily.  . Ascorbic Acid (VITAMIN C) 1000 MG tablet Take 1,000 mg by mouth daily.  Marland Kitchen atorvastatin (LIPITOR) 10 MG tablet Take 10 mg by mouth daily at 6 PM.   . b complex vitamins capsule Take 1 capsule by mouth daily.  . Biotin 5000 MCG TABS Take 1 tablet by mouth 3 (three) times a week.  . calcium citrate-vitamin D (CALCIUM CITRATE + D) 315-200  MG-UNIT tablet Take 2 tablets by mouth 2 (two) times daily.  . Cholecalciferol (VITAMIN D) 2000 units CAPS Take 1 capsule by mouth daily.  . Docosahexaenoic Acid-EPA (OMEGA-3) 180-270 MG CAPS Take 1 capsule by mouth daily.  Marland Kitchen docusate sodium (COLACE) 250 MG capsule Take 250 mg by mouth daily.  . furosemide (LASIX) 20 MG tablet Take 20 mg by mouth every 7 (seven) days. Take once a week per patient . No specific days  . Glucosamine-MSM-Hyaluronic Acd 750-375-30 MG TABS Take 2 tablets by mouth 2 (two) times daily.  Marland Kitchen lisinopril (PRINIVIL,ZESTRIL) 20 MG tablet Take 20 mg by mouth daily.  . Magnesium (CVS TRIPLE MAGNESIUM COMPLEX) 400 MG CAPS Take 2 capsules by mouth daily.  . pantoprazole (PROTONIX) 40 MG tablet Take 40 mg by mouth as needed.  . warfarin (JANTOVEN) 5 MG tablet Take as directed by [facility]madin Clinic  . zinc gluconate 50 MG tablet Take 50 mg by mouth daily.     Allergies:   Codeine and Sudafed [pseudoephedrine hcl]   Social History   Socioeconomic History  . Marital status: Married    Spouse name: Not on file  . Number of children: Not on file  . Years of education: Not on file  . Highest education level: Not on file  Occupational History  . Not on file  Social Needs  . Financial resource strain: Not on file  . Food insecurity:  Worry: Not on file    Inability: Not on file  . Transportation needs:    Medical: Not on file    Non-medical: Not on file  Tobacco Use  . Smoking status: Never Smoker  . Smokeless tobacco: Never Used  Substance and Sexual Activity  . Alcohol use: No  . Drug use: No  . Sexual activity: Not on file  Lifestyle  . Physical activity:    Days per week: Not on file    Minutes per session: Not on file  . Stress: Not on file  Relationships  . Social connections:    Talks on phone: Not on file    Gets together: Not on file    Attends religious service: Not on file    Active member of club or organization: Not on file    Attends meetings of  clubs or organizations: Not on file    Relationship status: Not on file  Other Topics Concern  . Not on file  Social History Narrative  . Not on file     Family History: The patient's family history includes Heart failure in her sister.  ROS:   Please see the history of present illness.    Denies any fevers chills nausea vomiting syncope bleeding.  She does have occasional abdominal discomfort, wears hearing aids wears glasses, anxiety at times.  All other systems reviewed and are negative.  EKGs/Labs/Other Studies Reviewed:    The following studies were reviewed today:  Echocardiogram 2014 demonstrated normal ejection fraction, mechanical aortic valve with upper normal peak velocity.  Carotid Dopplers 09/07/16: Heterogeneous plaque, bilaterally. 1-39% bilateral ICA stenosis. Patent vertebral arteries with antegrade flow. Normal subclavian arteries, bilaterally. F/u prn  EKG:  EKG is  ordered today.  The ekg ordered today demonstrates 09/10/2018-sinus rhythm 64 with no other abnormalities personally reviewed and interpreted-previously11/1/18-sinus rhythm 64 with no other abnormalities personally viewed-prior 11/11/14-sinus rhythm, PVC, heart rate 68, no other obvious abnormalities.-Prior xx6/4/14: Sinus rhythm rate 69 with PVC. EKG 04/09/13 shows sinus rhythm with rare PVC. Heart rate 69.  Recent Labs: No results found for requested labs within last 8760 hours.  Recent Lipid Panel No results found for: CHOL, TRIG, HDL, CHOLHDL, VLDL, LDLCALC, LDLDIRECT  Physical Exam:    VS:  BP (!) 142/68   Pulse 64   Ht 5\' 1"  (1.549 m)   Wt 161 lb 9.6 oz (73.3 kg)   BMI 30.53 kg/m     Wt Readings from Last 3 Encounters:  09/10/18 161 lb 9.6 oz (73.3 kg)  09/06/17 164 lb 12.8 oz (74.8 kg)  08/14/16 163 lb 1.9 oz (74 kg)     GEN:  Well nourished, well developed in no acute distress HEENT: Normal NECK: No JVD; No carotid bruits LYMPHATICS: No lymphadenopathy CARDIAC: RRR, no murmurs,  rubs, gallops, sharp S2 click RESPIRATORY:  Clear to auscultation without rales, wheezing or rhonchi  ABDOMEN: Soft, non-tender, non-distended MUSCULOSKELETAL:  No edema; No deformity  SKIN: Warm and dry NEUROLOGIC:  Alert and oriented x 3 PSYCHIATRIC:  Normal affect   ASSESSMENT:    1. Chronic diastolic heart failure (HCC)   2. H/O mechanical aortic valve replacement   3. Bilateral carotid artery stenosis   4. Essential hypertension    PLAN:    In order of problems listed above:  Mechanical aortic valve -Continue with chronic antic regulation with Coumadin.  Stable on prior echocardiogram. -Dental prophylaxis.  Chronic anticoagulation -Coumadin, 2-3 INR. 2017 - coumadin INR 8, in hospital.  6.5  in the emergency room.  Had melena.  Had taking PPI in the past. Last few weeks, constipated, nausea with eating past few weeks, nausea.  Dr. Katrinka Blazing had given her MiraLAX previously, I recommended she contact her again for further suggestions.  She does have a 10 cm hiatal hernia detected by Dr. Madilyn Fireman of gastroenterology on 07/14/2016 upper endoscopy.  Had Cameron ulcers.  PPI was administered at that time.  In review of discharge summary it was recommended that she continue twice a day PPI indefinitely.  Chronic diastolic heart failure - Low-dose Lasix helps with shortness of breath in the past.  Continue to stay active, salt restriction fluid restriction, weight loss.  Hyperlipidemia - Overall doing quite well with dietary modification and atorvastatin.  Mild carotid artery disease bilaterally - Continue with aggressive secondary risk factor prevention.  Statin.  Coumadin.  She did have a left eye visual change previously.  One year follow-up.   Medication Adjustments/Labs and Tests Ordered: Current medicines are reviewed at length with the patient today.  Concerns regarding medicines are outlined above.  Orders Placed This Encounter  Procedures  . EKG 12-Lead   No orders of the  defined types were placed in this encounter.   Patient Instructions  Medication Instructions:  The current medical regimen is effective;  continue present plan and medications.  If you need a refill on your cardiac medications before your next appointment, please call your pharmacy.   Follow-Up: At [facility], you and your health needs are our priority.  As part of our continuing mission to provide you with exceptional heart care, we have created designated Provider Care Teams.  These Care Teams include your primary Cardiologist (physician) and Advanced Practice Providers (APPs -  Physician Assistants and Nurse Practitioners) who all work together to provide you with the care you need, when you need it. You will need a follow up appointment in 12 months.  Please call our office 2 months in advance to schedule this appointment.  You may see Dr Donato Schultz or one of the following Advanced Practice Providers on your designated Care Team:   Norma Fredrickson, NP Nada Boozer, NP . Georgie Chard, NP  Thank you for choosing [facility]!!        Signed, Donato Schultz, MD  09/10/2018 9:39 AM    [facility]

## 2018-09-22 DIAGNOSIS — R300 Dysuria: Secondary | ICD-10-CM | POA: Diagnosis not present

## 2018-09-22 DIAGNOSIS — N390 Urinary tract infection, site not specified: Secondary | ICD-10-CM | POA: Diagnosis not present

## 2018-10-01 ENCOUNTER — Ambulatory Visit: Payer: Medicare HMO

## 2018-10-01 DIAGNOSIS — Z5181 Encounter for therapeutic drug level monitoring: Secondary | ICD-10-CM

## 2018-10-01 DIAGNOSIS — Z952 Presence of prosthetic heart valve: Secondary | ICD-10-CM | POA: Diagnosis not present

## 2018-10-01 LAB — POCT INR: INR: 1.8 — AB (ref 2.0–3.0)

## 2018-10-01 NOTE — Patient Instructions (Signed)
Please take 1.5 tablets today, then continue taking 1 tablet daily except take 1/2 tablet on Mondays,  Wednesdays and Fridays   Recheck INR in 2 weeks.  Call Coumadin Clinic with any questions [phone].

## 2018-10-09 DIAGNOSIS — R69 Illness, unspecified: Secondary | ICD-10-CM | POA: Diagnosis not present

## 2018-10-14 ENCOUNTER — Other Ambulatory Visit: Payer: Self-pay | Admitting: Family Medicine

## 2018-10-14 ENCOUNTER — Ambulatory Visit
Admission: RE | Admit: 2018-10-14 | Discharge: 2018-10-14 | Disposition: A | Discharge status: Home / Self Care | Payer: Medicare HMO | Source: Ambulatory Visit | Attending: Family Medicine | Admitting: Family Medicine

## 2018-10-14 DIAGNOSIS — Z1231 Encounter for screening mammogram for malignant neoplasm of breast: Secondary | ICD-10-CM | POA: Diagnosis not present

## 2018-10-14 DIAGNOSIS — M8589 Other specified disorders of bone density and structure, multiple sites: Secondary | ICD-10-CM | POA: Diagnosis not present

## 2018-10-14 DIAGNOSIS — Z780 Asymptomatic menopausal state: Secondary | ICD-10-CM | POA: Diagnosis not present

## 2018-10-14 DIAGNOSIS — M8580 Other specified disorders of bone density and structure, unspecified site: Secondary | ICD-10-CM

## 2018-10-16 ENCOUNTER — Ambulatory Visit: Payer: Medicare HMO | Admitting: Pharmacist

## 2018-10-16 DIAGNOSIS — Z5181 Encounter for therapeutic drug level monitoring: Secondary | ICD-10-CM

## 2018-10-16 DIAGNOSIS — Z952 Presence of prosthetic heart valve: Secondary | ICD-10-CM

## 2018-10-16 LAB — POCT INR: INR: 2 (ref 2.0–3.0)

## 2018-10-16 NOTE — Patient Instructions (Addendum)
Description   Please take 1 tablets today, then continue taking 1 tablet daily except take 1/2 tablet on Mondays,  Wednesdays and Fridays   Recheck INR in 3 weeks.  Call Coumadin Clinic with any questions [phone].

## 2018-11-08 ENCOUNTER — Ambulatory Visit: Payer: Medicare HMO | Admitting: Pharmacist

## 2018-11-08 DIAGNOSIS — Z952 Presence of prosthetic heart valve: Secondary | ICD-10-CM

## 2018-11-08 DIAGNOSIS — Z5181 Encounter for therapeutic drug level monitoring: Secondary | ICD-10-CM | POA: Diagnosis not present

## 2018-11-08 LAB — POCT INR: INR: 2.1 (ref 2.0–3.0)

## 2018-12-05 ENCOUNTER — Ambulatory Visit: Payer: Medicare HMO | Admitting: Pharmacist

## 2018-12-05 DIAGNOSIS — Z5181 Encounter for therapeutic drug level monitoring: Secondary | ICD-10-CM | POA: Diagnosis not present

## 2018-12-05 DIAGNOSIS — Z952 Presence of prosthetic heart valve: Secondary | ICD-10-CM | POA: Diagnosis not present

## 2018-12-05 LAB — POCT INR: INR: 1.6 — AB (ref 2.0–3.0)

## 2018-12-05 NOTE — Patient Instructions (Signed)
Description   Take 1.5 tablets today and 1 tablet tomorrow, then continue taking 1 tablet daily except take 1/2 tablet on Mondays,  Wednesdays and Fridays   Recheck INR in 3 weeks.  Call Coumadin Clinic with any questions [phone].

## 2018-12-23 IMAGING — MG DIGITAL SCREENING BILATERAL MAMMOGRAM WITH TOMO AND CAD
8 series · 8 of 24 positions shown · non-contrast
Comparison: Previous exam(s).

ACR Breast Density Category a: The breast tissue is almost entirely
fatty.

CLINICAL DATA: Screening.

EXAM:
DIGITAL SCREENING BILATERAL MAMMOGRAM WITH TOMO AND CAD

[L MLO synth-2D]
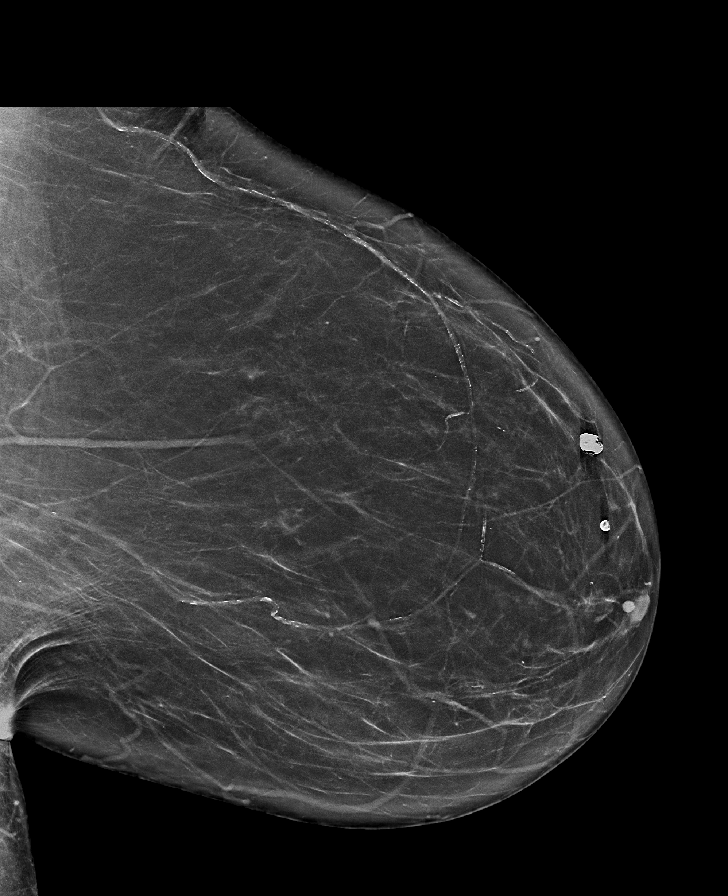

[L CC synth-2D]
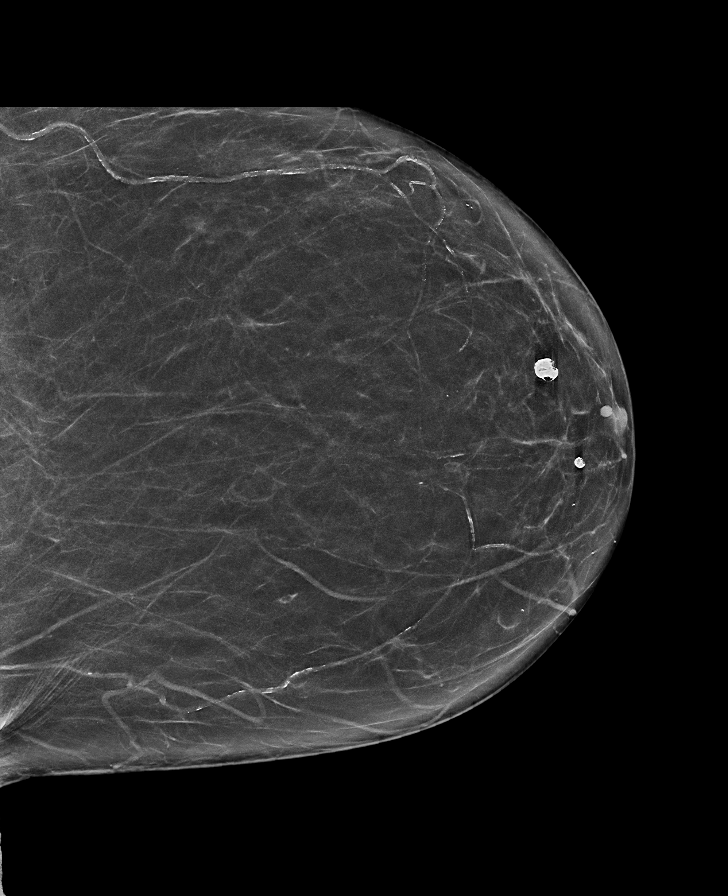

[R MLO synth-2D]
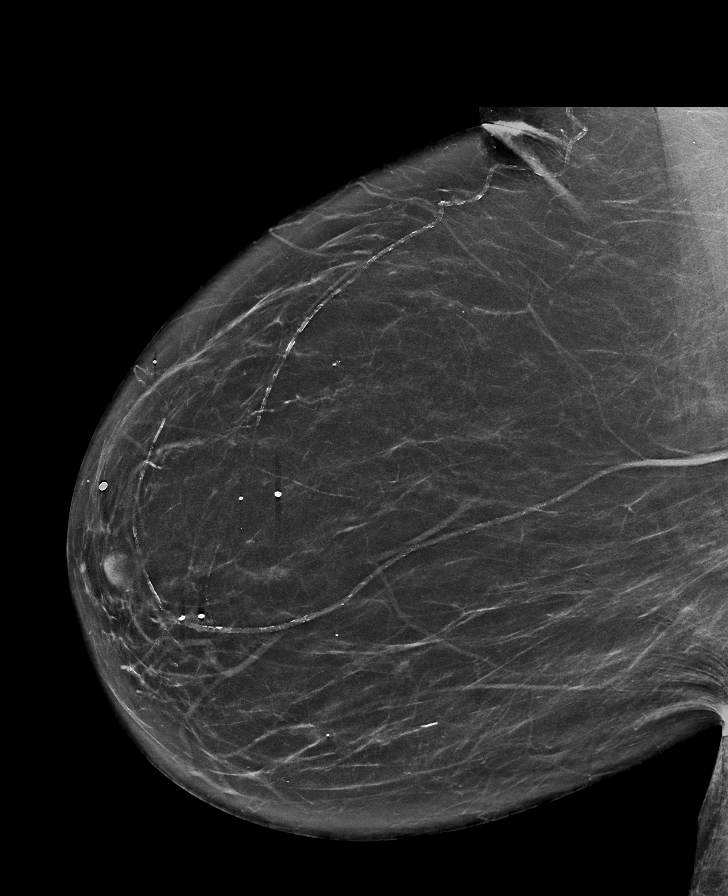

[R CC synth-2D]
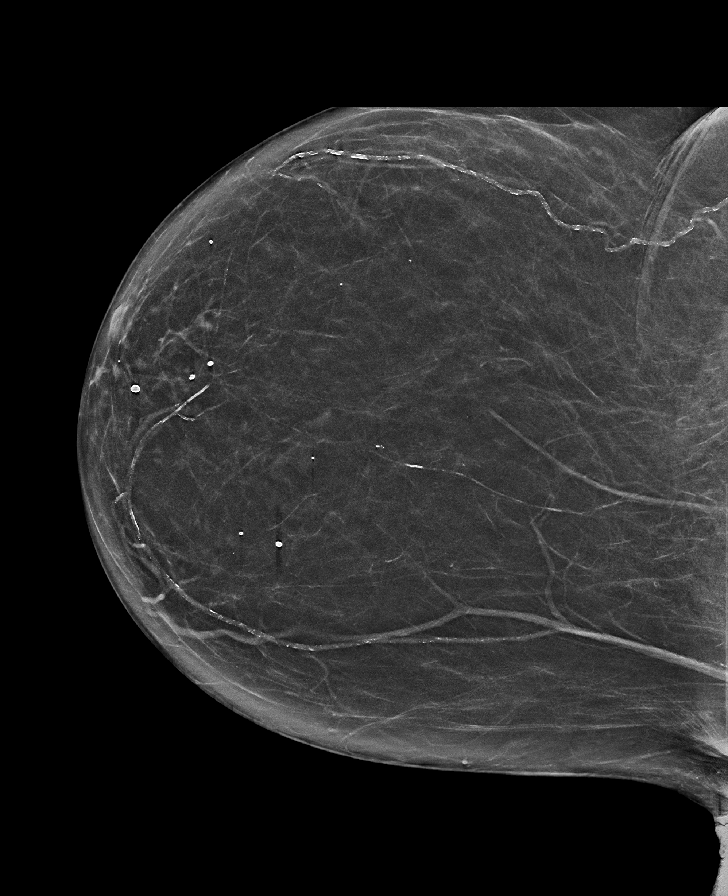

[R CC tomo · tomo slice 33/66.0]
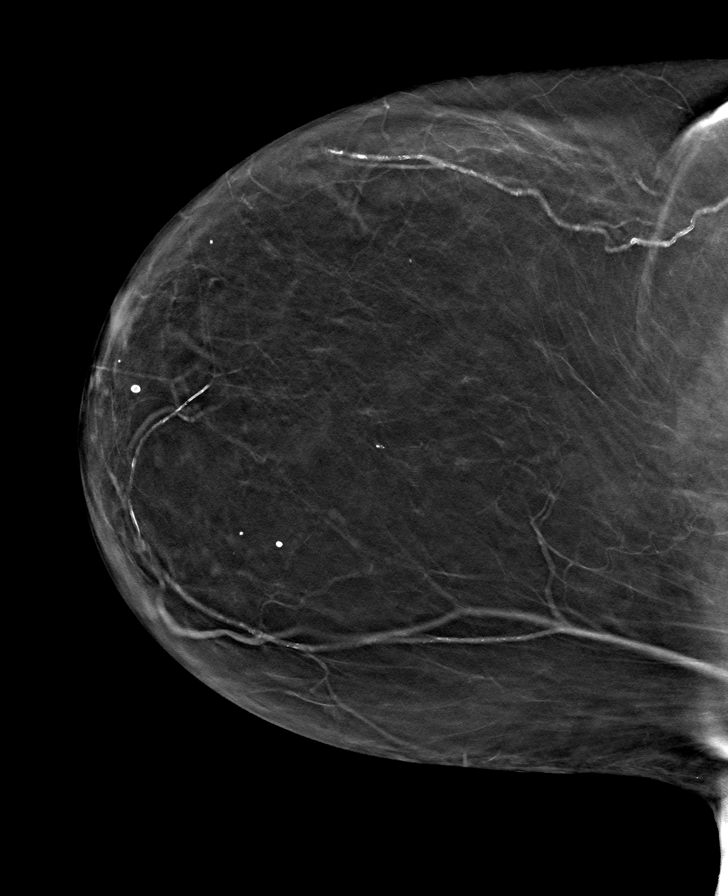

[L MLO tomo · tomo slice 37/74.0]
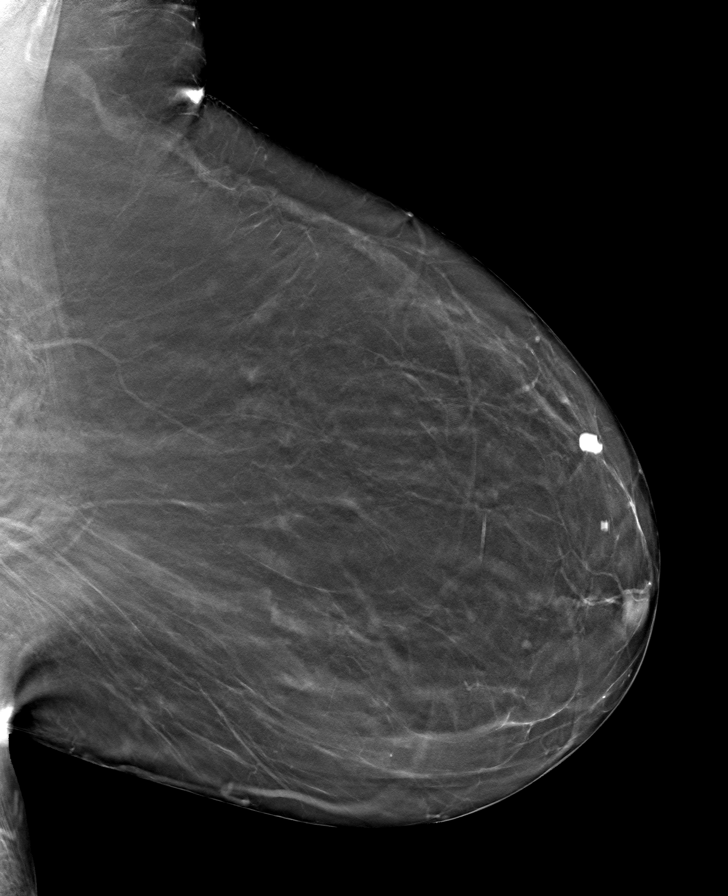

[L CC tomo · tomo slice 34/67.0]
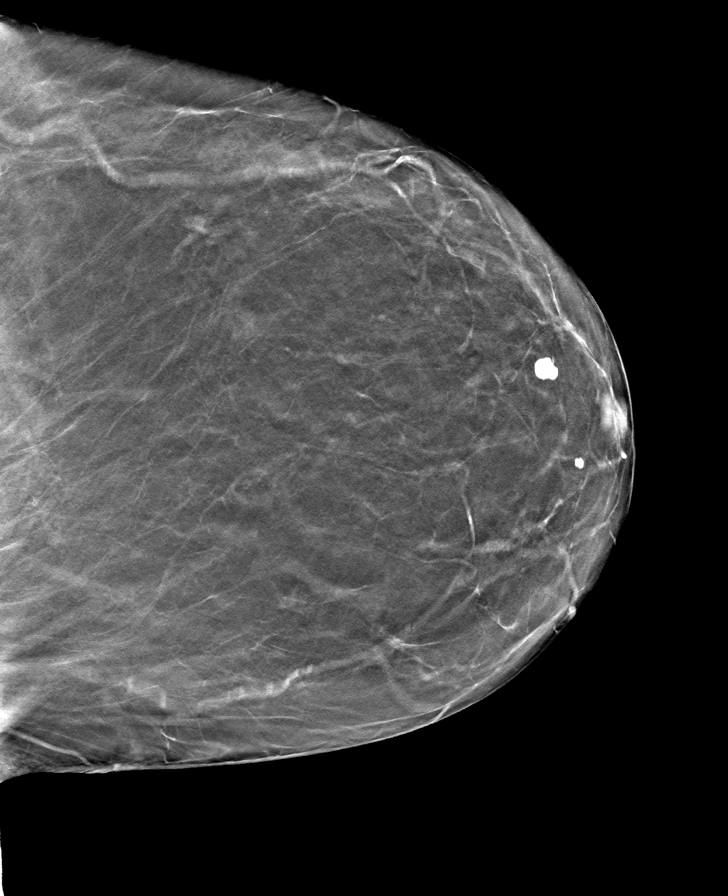

[R MLO tomo · tomo slice 35/70.0]
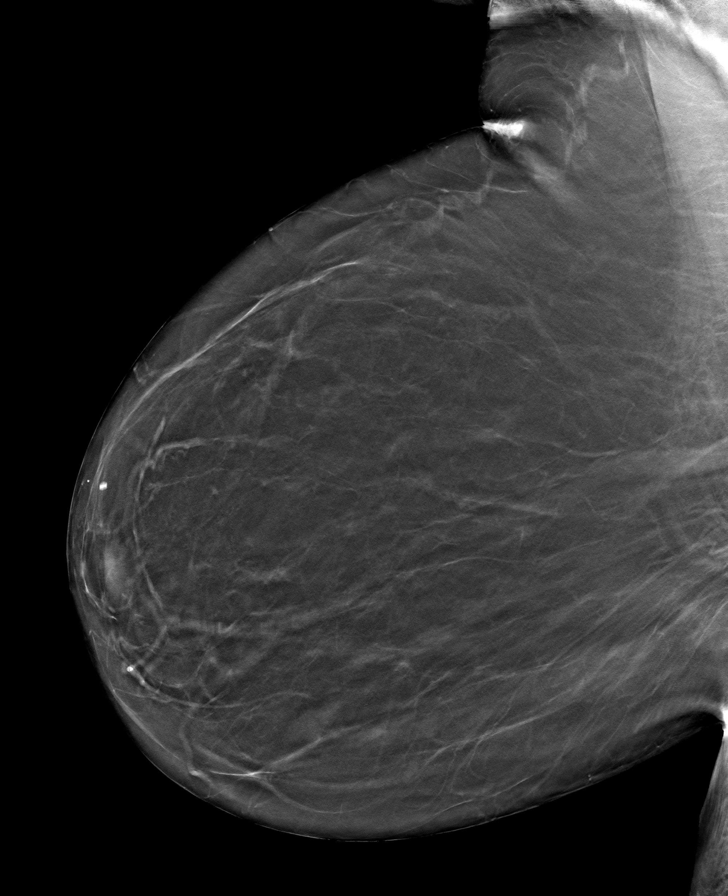

[8 of 24 positions shown; findings below may reference images not displayed]

FINDINGS: There are no findings suspicious for malignancy. Images were
processed with CAD.
IMPRESSION: No mammographic evidence of malignancy. A result letter of this
screening mammogram will be mailed directly to the patient.

RECOMMENDATION:
Screening mammogram in one year. (Code:[id number]-[id number])

BI-RADS CATEGORY  1: Negative.

## 2018-12-26 ENCOUNTER — Ambulatory Visit: Payer: Medicare HMO | Admitting: Pharmacist

## 2018-12-26 DIAGNOSIS — Z5181 Encounter for therapeutic drug level monitoring: Secondary | ICD-10-CM | POA: Diagnosis not present

## 2018-12-26 DIAGNOSIS — Z952 Presence of prosthetic heart valve: Secondary | ICD-10-CM

## 2018-12-26 LAB — POCT INR: INR: 2 (ref 2.0–3.0)

## 2018-12-26 NOTE — Patient Instructions (Addendum)
  Description   Continue taking 1 tablet daily except take 1/2 tablet on Mondays, Wednesdays and Fridays.   Recheck INR in 3 weeks.  Call Coumadin Clinic with any questions [phone].

## 2019-01-20 ENCOUNTER — Other Ambulatory Visit: Payer: Self-pay

## 2019-01-20 ENCOUNTER — Ambulatory Visit: Payer: Medicare HMO

## 2019-01-20 DIAGNOSIS — Z5181 Encounter for therapeutic drug level monitoring: Secondary | ICD-10-CM | POA: Diagnosis not present

## 2019-01-20 DIAGNOSIS — Z952 Presence of prosthetic heart valve: Secondary | ICD-10-CM

## 2019-01-20 LAB — POCT INR: INR: 1.8 — AB (ref 2.0–3.0)

## 2019-01-20 NOTE — Patient Instructions (Addendum)
  Description   Start taking 1 tablet daily except take 1/2 tablet on Mondays and Fridays.   Recheck INR in 3 weeks.  Call Coumadin Clinic with any questions [phone].

## 2019-01-22 ENCOUNTER — Other Ambulatory Visit: Payer: Self-pay | Admitting: Cardiology

## 2019-02-10 ENCOUNTER — Telehealth: Payer: Self-pay

## 2019-02-10 DIAGNOSIS — R32 Unspecified urinary incontinence: Secondary | ICD-10-CM | POA: Diagnosis not present

## 2019-02-10 DIAGNOSIS — I10 Essential (primary) hypertension: Secondary | ICD-10-CM | POA: Diagnosis not present

## 2019-02-10 DIAGNOSIS — E785 Hyperlipidemia, unspecified: Secondary | ICD-10-CM | POA: Diagnosis not present

## 2019-02-10 DIAGNOSIS — Z952 Presence of prosthetic heart valve: Secondary | ICD-10-CM | POA: Diagnosis not present

## 2019-02-10 DIAGNOSIS — K5901 Slow transit constipation: Secondary | ICD-10-CM | POA: Diagnosis not present

## 2019-02-10 DIAGNOSIS — Z9109 Other allergy status, other than to drugs and biological substances: Secondary | ICD-10-CM | POA: Diagnosis not present

## 2019-02-10 NOTE — Telephone Encounter (Signed)

## 2019-02-11 ENCOUNTER — Ambulatory Visit (INDEPENDENT_AMBULATORY_CARE_PROVIDER_SITE_OTHER): Payer: Medicare HMO | Admitting: *Deleted

## 2019-02-11 ENCOUNTER — Other Ambulatory Visit: Payer: Self-pay

## 2019-02-11 DIAGNOSIS — Z952 Presence of prosthetic heart valve: Secondary | ICD-10-CM | POA: Diagnosis not present

## 2019-02-11 DIAGNOSIS — Z5181 Encounter for therapeutic drug level monitoring: Secondary | ICD-10-CM

## 2019-02-11 LAB — POCT INR: INR: 2.1 (ref 2.0–3.0)

## 2019-03-12 ENCOUNTER — Telehealth: Payer: Self-pay

## 2019-03-12 NOTE — Telephone Encounter (Signed)

## 2019-03-14 ENCOUNTER — Other Ambulatory Visit: Payer: Self-pay

## 2019-03-14 ENCOUNTER — Ambulatory Visit (INDEPENDENT_AMBULATORY_CARE_PROVIDER_SITE_OTHER): Payer: Medicare HMO | Admitting: *Deleted

## 2019-03-14 DIAGNOSIS — Z5181 Encounter for therapeutic drug level monitoring: Secondary | ICD-10-CM | POA: Diagnosis not present

## 2019-03-14 DIAGNOSIS — Z952 Presence of prosthetic heart valve: Secondary | ICD-10-CM | POA: Diagnosis not present

## 2019-03-14 LAB — POCT INR: INR: 3.1 — AB (ref 2.0–3.0)

## 2019-03-27 ENCOUNTER — Telehealth: Payer: Self-pay

## 2019-03-27 NOTE — Telephone Encounter (Signed)

## 2019-03-28 ENCOUNTER — Other Ambulatory Visit: Payer: Self-pay

## 2019-03-28 ENCOUNTER — Ambulatory Visit (INDEPENDENT_AMBULATORY_CARE_PROVIDER_SITE_OTHER): Payer: Medicare HMO | Admitting: Pharmacist

## 2019-03-28 DIAGNOSIS — Z952 Presence of prosthetic heart valve: Secondary | ICD-10-CM

## 2019-03-28 DIAGNOSIS — Z5181 Encounter for therapeutic drug level monitoring: Secondary | ICD-10-CM | POA: Diagnosis not present

## 2019-03-28 LAB — POCT INR: INR: 2.4 (ref 2.0–3.0)

## 2019-04-22 ENCOUNTER — Telehealth: Payer: Self-pay | Admitting: *Deleted

## 2019-04-22 NOTE — Telephone Encounter (Signed)
Left a message for the patient to call back as her husband's appt had been changed because he has another appt in the office on 04/30/2019 and the original appt was on 04/29/2019. Pt and husband come to appts together and were scheduled the same day but moved both over since she will bringing pt and left her a message to confirm, will await a callback.

## 2019-04-23 ENCOUNTER — Telehealth: Payer: Self-pay

## 2019-04-23 NOTE — Telephone Encounter (Signed)

## 2019-04-23 NOTE — Telephone Encounter (Signed)
Patient was called for prescreening for the upcoming appt and she was made aware of the appt at that time.

## 2019-04-30 ENCOUNTER — Ambulatory Visit: Payer: Medicare HMO | Admitting: *Deleted

## 2019-04-30 ENCOUNTER — Other Ambulatory Visit: Payer: Self-pay

## 2019-04-30 DIAGNOSIS — Z952 Presence of prosthetic heart valve: Secondary | ICD-10-CM | POA: Diagnosis not present

## 2019-04-30 DIAGNOSIS — Z5181 Encounter for therapeutic drug level monitoring: Secondary | ICD-10-CM | POA: Diagnosis not present

## 2019-04-30 LAB — POCT INR: INR: 2 (ref 2.0–3.0)

## 2019-04-30 NOTE — Patient Instructions (Addendum)
Description   Continue taking 1 tablet daily except take 1/2 tablet on Mondays, Wednesdays and Fridays.  Recheck INR in 3-4 weeks. Call Coumadin Clinic with any questions [phone].

## 2019-05-26 ENCOUNTER — Telehealth: Payer: Self-pay

## 2019-05-26 NOTE — Telephone Encounter (Signed)
Unable to lmom for prescreen  

## 2019-05-28 ENCOUNTER — Other Ambulatory Visit: Payer: Self-pay

## 2019-05-28 ENCOUNTER — Ambulatory Visit: Payer: Medicare HMO

## 2019-05-28 DIAGNOSIS — Z952 Presence of prosthetic heart valve: Secondary | ICD-10-CM | POA: Diagnosis not present

## 2019-05-28 DIAGNOSIS — Z5181 Encounter for therapeutic drug level monitoring: Secondary | ICD-10-CM

## 2019-05-28 LAB — POCT INR: INR: 2.7 (ref 2.0–3.0)

## 2019-05-28 NOTE — Patient Instructions (Signed)
Description   Take 1/2 tablet tomorrow, then resume same dosage 1 tablet daily except take 1/2 tablet on Mondays, Wednesdays and Fridays.  Recheck INR in 4 weeks. Call Coumadin Clinic with any questions [phone].

## 2019-06-20 ENCOUNTER — Ambulatory Visit: Payer: Medicare HMO | Admitting: *Deleted

## 2019-06-20 ENCOUNTER — Other Ambulatory Visit: Payer: Self-pay

## 2019-06-20 DIAGNOSIS — Z5181 Encounter for therapeutic drug level monitoring: Secondary | ICD-10-CM | POA: Diagnosis not present

## 2019-06-20 DIAGNOSIS — Z952 Presence of prosthetic heart valve: Secondary | ICD-10-CM

## 2019-06-20 LAB — POCT INR: INR: 2.5 (ref 2.0–3.0)

## 2019-06-20 NOTE — Patient Instructions (Signed)
Description   Continue same dosage 1 tablet daily except take 1/2 tablet on Mondays, Wednesdays and Fridays.  Recheck INR in 4 weeks. Call Coumadin Clinic with any questions #[phone].     

## 2019-07-13 ENCOUNTER — Other Ambulatory Visit: Payer: Self-pay | Admitting: Cardiology

## 2019-07-17 ENCOUNTER — Ambulatory Visit (INDEPENDENT_AMBULATORY_CARE_PROVIDER_SITE_OTHER): Payer: Medicare HMO | Admitting: *Deleted

## 2019-07-17 ENCOUNTER — Other Ambulatory Visit: Payer: Self-pay

## 2019-07-17 DIAGNOSIS — Z5181 Encounter for therapeutic drug level monitoring: Secondary | ICD-10-CM | POA: Diagnosis not present

## 2019-07-17 DIAGNOSIS — Z952 Presence of prosthetic heart valve: Secondary | ICD-10-CM | POA: Diagnosis not present

## 2019-07-17 LAB — POCT INR: INR: 3 (ref 2.0–3.0)

## 2019-07-17 NOTE — Patient Instructions (Signed)
Description   Do not take any today then continue same dosage 1 tablet daily except take 1/2 tablet on Mondays, Wednesdays and Fridays.  Recheck INR in 3 weeks. Call Coumadin Clinic with any questions [phone].

## 2019-07-28 DIAGNOSIS — R69 Illness, unspecified: Secondary | ICD-10-CM | POA: Diagnosis not present

## 2019-08-07 ENCOUNTER — Other Ambulatory Visit: Payer: Self-pay

## 2019-08-07 ENCOUNTER — Ambulatory Visit: Payer: Medicare HMO | Admitting: *Deleted

## 2019-08-07 DIAGNOSIS — Z5181 Encounter for therapeutic drug level monitoring: Secondary | ICD-10-CM

## 2019-08-07 DIAGNOSIS — Z952 Presence of prosthetic heart valve: Secondary | ICD-10-CM | POA: Diagnosis not present

## 2019-08-07 LAB — POCT INR: INR: 2.4 (ref 2.0–3.0)

## 2019-08-07 NOTE — Patient Instructions (Addendum)
Description   Continue same dosage 1 tablet daily except take 1/2 tablet on Mondays, Wednesdays and Fridays.  Recheck INR in 4 weeks. Call Coumadin Clinic with any questions #[phone].     

## 2019-09-01 DIAGNOSIS — Z952 Presence of prosthetic heart valve: Secondary | ICD-10-CM | POA: Diagnosis not present

## 2019-09-01 DIAGNOSIS — E785 Hyperlipidemia, unspecified: Secondary | ICD-10-CM | POA: Diagnosis not present

## 2019-09-01 DIAGNOSIS — Z0000 Encounter for general adult medical examination without abnormal findings: Secondary | ICD-10-CM | POA: Diagnosis not present

## 2019-09-01 DIAGNOSIS — I10 Essential (primary) hypertension: Secondary | ICD-10-CM | POA: Diagnosis not present

## 2019-09-01 DIAGNOSIS — M8580 Other specified disorders of bone density and structure, unspecified site: Secondary | ICD-10-CM | POA: Diagnosis not present

## 2019-09-01 DIAGNOSIS — Z1389 Encounter for screening for other disorder: Secondary | ICD-10-CM | POA: Diagnosis not present

## 2019-09-01 DIAGNOSIS — Z7901 Long term (current) use of anticoagulants: Secondary | ICD-10-CM | POA: Diagnosis not present

## 2019-09-01 DIAGNOSIS — I4891 Unspecified atrial fibrillation: Secondary | ICD-10-CM | POA: Diagnosis not present

## 2019-09-04 ENCOUNTER — Ambulatory Visit: Payer: Medicare HMO | Admitting: Pharmacist

## 2019-09-04 ENCOUNTER — Other Ambulatory Visit: Payer: Self-pay

## 2019-09-04 DIAGNOSIS — Z952 Presence of prosthetic heart valve: Secondary | ICD-10-CM | POA: Diagnosis not present

## 2019-09-04 DIAGNOSIS — Z5181 Encounter for therapeutic drug level monitoring: Secondary | ICD-10-CM

## 2019-09-04 LAB — POCT INR: INR: 1.9 — AB (ref 2.0–3.0)

## 2019-09-04 NOTE — Patient Instructions (Addendum)
Description   Take 1.5 tablets today and then continue same dosage 1 tablet daily except take 1/2 tablet on Mondays, Wednesdays and Fridays.  Recheck INR in 3 weeks. Call Coumadin Clinic with any questions [phone].

## 2019-09-18 ENCOUNTER — Ambulatory Visit: Payer: Medicare HMO | Admitting: Cardiology

## 2019-09-18 ENCOUNTER — Other Ambulatory Visit: Payer: Self-pay

## 2019-09-18 ENCOUNTER — Encounter: Payer: Self-pay | Admitting: Cardiology

## 2019-09-18 VITALS — BP 134/66 | HR 69 | Ht 61.0 in | Wt 157.8 lb

## 2019-09-18 DIAGNOSIS — I6523 Occlusion and stenosis of bilateral carotid arteries: Secondary | ICD-10-CM | POA: Diagnosis not present

## 2019-09-18 DIAGNOSIS — I10 Essential (primary) hypertension: Secondary | ICD-10-CM | POA: Diagnosis not present

## 2019-09-18 DIAGNOSIS — I5032 Chronic diastolic (congestive) heart failure: Secondary | ICD-10-CM

## 2019-09-18 DIAGNOSIS — E785 Hyperlipidemia, unspecified: Secondary | ICD-10-CM | POA: Diagnosis not present

## 2019-09-18 DIAGNOSIS — Z952 Presence of prosthetic heart valve: Secondary | ICD-10-CM | POA: Diagnosis not present

## 2019-09-18 NOTE — Progress Notes (Signed)
Cardiology Office Note:    Date:  09/18/2019   ID:  Tracy Vargas, DOB 03/14/1934, MRN [id number]  PCP:  Carol Ada, MD  Cardiologist:  Candee Furbish, MD  Electrophysiologist:  None   Referring MD: Carol Ada, MD     History of Present Illness:    Tracy Vargas is a 85 y.o. female here for the follow-up of mechanical heart valve placed in 1999 on chronic anticoagulation with Coumadin.  At one point, low-dose Lasix was administered and shortness of breath seemed to improve.  Diastolic dysfunction noted previously on echocardiogram reviewed.  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  Overall she is doing quite well but she is anxious about her husband who since the spring 2019 started to have health problems.  09/18/2019-here for mechanical aortic valve follow-up.  She is doing quite well.  At the end of the day she usually will feel some tiredness but she is not having any overt shortness of breath syncope bleeding.  Back in 2017 she did have a GI bleeding episode.  INR was elevated at the time.  No myalgias.  She is taking care of her husband who is a patient of Dr. York Cerise, had TAVR valve.  She stated that she did get to go into the hospital with him despite Covid regulations because of a letter that was written on her behalf.  Past Medical History:  Diagnosis Date  . Acquired hyperlipoproteinemia   . Chronic diastolic heart failure ([place]) 09/01/2013  . Diastolic dysfunction   . Dyspnea   . HTN (hypertension)   . Hyperlipidemia   . Osteopenia     Past Surgical History:  Procedure Laterality Date  . APPENDECTOMY    . CARDIAC VALVE REPLACEMENT    . CATARACT EXTRACTION    . ESOPHAGOGASTRODUODENOSCOPY N/A 07/14/2016   Procedure: ESOPHAGOGASTRODUODENOSCOPY (EGD);  Surgeon: Teena Irani, MD;  Location: [facility]OSCOPY;  Service: Endoscopy;  Laterality: N/A;  . ROTATOR CUFF REPAIR    . TONSILLECTOMY       Current Medications: Current Meds  Medication Sig  . amLODipine (NORVASC) 2.5 MG tablet Take 2.5 mg by mouth daily.  Marland Kitchen amoxicillin (AMOXIL) 500 MG capsule Take 4 capsules by mouth as needed. Pt takes 4 capsules prior dental appt.  . Ascorbic Acid (VITAMIN C) 1000 MG tablet Take 1,000 mg by mouth daily.  Marland Kitchen atorvastatin (LIPITOR) 10 MG tablet Take 10 mg by mouth daily at 6 PM.   . b complex vitamins capsule Take 1 capsule by mouth daily.  . Biotin 5000 MCG TABS Take 1 tablet by mouth 3 (three) times a week.  . calcium citrate-vitamin D (CALCIUM CITRATE + D) 315-200 MG-UNIT tablet Take 2 tablets by mouth 2 (two) times daily.  . Cholecalciferol (VITAMIN D) 2000 units CAPS Take 1 capsule by mouth daily.  . Docosahexaenoic Acid-EPA (OMEGA-3) 180-270 MG CAPS Take 1 capsule by mouth daily.  Marland Kitchen docusate sodium (COLACE) 250 MG capsule Take 250 mg by mouth daily.  . furosemide (LASIX) 20 MG tablet Take 20 mg by mouth every 7 (seven) days. Take once a week per patient . No specific days  . Glucosamine-MSM-Hyaluronic Acd 750-375-30 MG TABS Take 2 tablets by mouth 2 (two) times daily.  Marland Kitchen lisinopril (PRINIVIL,ZESTRIL) 20 MG tablet Take 20 mg by mouth daily.  . Magnesium (CVS TRIPLE MAGNESIUM COMPLEX) 400 MG CAPS Take 2 capsules by mouth daily.  . pantoprazole (PROTONIX) 40 MG tablet Take 40 mg  by mouth as needed.  . warfarin (JANTOVEN) 5 MG tablet TAKE AS DIRECTED BY        COUMADIN CLINIC  . zinc gluconate 50 MG tablet Take 50 mg by mouth daily.     Allergies:   Codeine and Sudafed [pseudoephedrine hcl]   Social History   Socioeconomic History  . Marital status: Married    Spouse name: Not on file  . Number of children: Not on file  . Years of education: Not on file  . Highest education level: Not on file  Occupational History  . Not on file  Social Needs  . Financial resource strain: Not on file  . Food insecurity    Worry: Not on file    Inability: Not on file  . Transportation needs     Medical: Not on file    Non-medical: Not on file  Tobacco Use  . Smoking status: Never Smoker  . Smokeless tobacco: Never Used  Substance and Sexual Activity  . Alcohol use: No  . Drug use: No  . Sexual activity: Not on file  Lifestyle  . Physical activity    Days per week: Not on Musicianer session: Not on file  . Stress: Not on file  Relationships  . Social connections    Talks on phone: Not on file    Gets together: Not on file    Attends religious service: Not on file    Active member of club or organization: Not on file    Attends meetings of clubs or organizations: Not on file    Relationship status: Not on file  Other Topics Concern  . Not on file  Social History Narrative  . Not on file     Family History: The patient's family history includes Heart failure in her sister.  ROS:   Please see the history of present illness.    Denies any fevers chills nausea vomiting syncope bleeding.  She does have occasional abdominal discomfort, wears hearing aids wears glasses, anxiety at times.  All other systems reviewed and are negative.  EKGs/Labs/Other Studies Reviewed:    The following studies were reviewed today:  Echocardiogram 2014 demonstrated normal ejection fraction, mechanical aortic valve with upper normal peak velocity.  Carotid Dopplers 09/07/16: Heterogeneous plaque, bilaterally. 1-39% bilateral ICA stenosis. Patent vertebral arteries with antegrade flow. Normal subclavian arteries, bilaterally. F/u prn  EKG:  EKG is  ordered today.  The ekg ordered today demonstrates/12/20-normal sinus rhythm 68 personally reviewed-09/10/2018-sinus rhythm 64 with no other abnormalities personally reviewed and interpreted-previously11/1/18-sinus rhythm 64 with no other abnormalities personally viewed-prior 11/11/14-sinus rhythm, PVC, heart rate 68, no other obvious abnormalities.-Prior xx6/4/14: Sinus rhythm rate 69 with PVC. EKG 04/09/13 shows sinus rhythm with rare  PVC. Heart rate 69.  Recent Labs: No results found for requested labs within last 8760 hours.  Recent Lipid Panel No results found for: CHOL, TRIG, HDL, CHOLHDL, VLDL, LDLCALC, LDLDIRECT  Physical Exam:    VS:  BP 134/66   Pulse 69   Ht  (1.549 m)   Wt 157 lb 12.8 oz (71.6 kg)   SpO2 96%   BMI 29.82 kg/m     Wt Readings from Last 3 Encounters:  09/18/19 157 lb 12.8 oz (71.6 kg)  09/10/18 161 lb 9.6 oz (73.3 kg)  09/06/17 164 lb 12.8 oz (74.8 kg)     GEN:  Well nourished, well developed in no acute distress HEENT: Normal NECK: No JVD; No carotid bruits  LYMPHATICS: No lymphadenopathy CARDIAC: RRR, no murmurs, rubs, gallops, sharp S2 click RESPIRATORY:  Clear to auscultation without rales, wheezing or rhonchi  ABDOMEN: Soft, non-tender, non-distended MUSCULOSKELETAL:  No edema; No deformity  SKIN: Warm and dry NEUROLOGIC:  Alert and oriented x 3 PSYCHIATRIC:  Normal affect   ASSESSMENT:    1. H/O mechanical aortic valve replacement   2. Chronic diastolic heart failure (HCC)   3. Essential hypertension   4. Bilateral carotid artery stenosis    PLAN:    In order of problems listed above:  Mechanical aortic valve -Continue with chronic antic regulation with Coumadin.  Stable on prior echocardiogram. 2014. Will check echocardiogram. it has been a few years. -Dental prophylaxis.  Chronic anticoagulation -Coumadin, 2-3 INR. 2017 - coumadin INR 8, in hospital.  6.5 in the emergency room.  Had melena.  Had taking PPI in the past. Last few weeks, constipated, nausea with eating past few weeks, nausea.  Dr. Katrinka BlazingSmith had given her MiraLAX previously, I recommended she contact her again for further suggestions.  She does have a 10 cm hiatal hernia detected by Dr. Madilyn FiremanHayes of gastroenterology on 07/14/2016 upper endoscopy.  Had Cameron ulcers.  PPI was administered at that time.  In review of discharge summary it was recommended that she continue twice a day PPI indefinitely.  No  recent bleeding.  Doing well.  Chronic diastolic heart failure - Low-dose Lasix helps with shortness of breath in the past.  Continue to stay active, salt restriction fluid restriction, weight loss.  Good job with weight loss.  Hyperlipidemia - Overall doing quite well with dietary modification and atorvastatin.  No changes made.  No myalgias.  Mild carotid artery disease bilaterally - Continue with aggressive secondary risk factor prevention.  Statin.  Coumadin.  She did have a left eye visual change previously.  Doing well.  One year follow-up.   Medication Adjustments/Labs and Tests Ordered: Current medicines are reviewed at length with the patient today.  Concerns regarding medicines are outlined above.  Orders Placed This Encounter  Procedures  . EKG 12-Lead  . ECHOCARDIOGRAM COMPLETE   No orders of the defined types were placed in this encounter.   Patient Instructions  Medication Instructions:  The current medical regimen is effective;  continue present plan and medications.  *If you need a refill on your cardiac medications before your next appointment, please [facility] your pharmacy*  Testing/Procedures: Your physician has requested that you have an echocardiogram after the 1st of the year. Echocardiography is a painless test that uses sound waves to create images of your heart. It provides your doctor with information about the size and shape of your heart and how well your heart's chambers and valves are working. This procedure takes approximately one hour. There are no restrictions for this procedure.  Follow-Up: At [facility], you and your health needs are our priority.  As part of our continuing mission to provide you with exceptional heart care, we have created designated Provider Care Teams.  These Care Teams include your primary Cardiologist (physician) and Advanced Practice Providers (APPs -  Physician Assistants and Nurse Practitioners) who all work together to  provide you with the care you need, when you need it.  Your next appointment:   12 months  The format for your next appointment:   In Person  Provider:   You may see Donato SchultzMark Tamer Baughman, MD or one of the following Advanced Practice Providers on your designated Care Team:    Norma FredricksonLori Gerhardt, NP  Nada Boozer, NP  Georgie Chard, NP   Thank you for choosing [facility]!!        Signed, Donato Schultz, MD  09/18/2019 10:17 AM    [facility]

## 2019-09-18 NOTE — Patient Instructions (Signed)
Medication Instructions:  The current medical regimen is effective;  continue present plan and medications.  *If you need a refill on your cardiac medications before your next appointment, please call your pharmacy*  Testing/Procedures: Your physician has requested that you have an echocardiogram after the 1st of the year. Echocardiography is a painless test that uses sound waves to create images of your heart. It provides your doctor with information about the size and shape of your heart and how well your heart's chambers and valves are working. This procedure takes approximately one hour. There are no restrictions for this procedure.  Follow-Up: At [facility], you and your health needs are our priority.  As part of our continuing mission to provide you with exceptional heart care, we have created designated Provider Care Teams.  These Care Teams include your primary Cardiologist (physician) and Advanced Practice Providers (APPs -  Physician Assistants and Nurse Practitioners) who all work together to provide you with the care you need, when you need it.  Your next appointment:   12 months  The format for your next appointment:   In Person  Provider:   You may see Candee Furbish, MD or one of the following Advanced Practice Providers on your designated Care Team:    Truitt Merle, NP  Cecilie Kicks, NP  Kathyrn Drown, NP   Thank you for choosing [facility]!!

## 2019-09-25 ENCOUNTER — Other Ambulatory Visit: Payer: Self-pay

## 2019-09-25 ENCOUNTER — Ambulatory Visit: Payer: Medicare HMO | Admitting: *Deleted

## 2019-09-25 DIAGNOSIS — Z5181 Encounter for therapeutic drug level monitoring: Secondary | ICD-10-CM | POA: Diagnosis not present

## 2019-09-25 DIAGNOSIS — Z952 Presence of prosthetic heart valve: Secondary | ICD-10-CM | POA: Diagnosis not present

## 2019-09-25 LAB — POCT INR: INR: 2.2 (ref 2.0–3.0)

## 2019-09-25 NOTE — Patient Instructions (Addendum)
Description   Continue same dosage 1 tablet daily except take 1/2 tablet on Mondays, Wednesdays and Fridays.  Recheck INR in 5 weeks. Call [facility] with any questions [phone].

## 2019-10-16 DIAGNOSIS — H40012 Open angle with borderline findings, low risk, left eye: Secondary | ICD-10-CM | POA: Diagnosis not present

## 2019-10-16 DIAGNOSIS — H5203 Hypermetropia, bilateral: Secondary | ICD-10-CM | POA: Diagnosis not present

## 2019-10-16 DIAGNOSIS — H348322 Tributary (branch) retinal vein occlusion, left eye, stable: Secondary | ICD-10-CM | POA: Diagnosis not present

## 2019-10-16 DIAGNOSIS — Z961 Presence of intraocular lens: Secondary | ICD-10-CM | POA: Diagnosis not present

## 2019-10-27 ENCOUNTER — Ambulatory Visit: Payer: Medicare HMO | Admitting: *Deleted

## 2019-10-27 ENCOUNTER — Other Ambulatory Visit: Payer: Self-pay

## 2019-10-27 DIAGNOSIS — Z5181 Encounter for therapeutic drug level monitoring: Secondary | ICD-10-CM | POA: Diagnosis not present

## 2019-10-27 DIAGNOSIS — Z952 Presence of prosthetic heart valve: Secondary | ICD-10-CM | POA: Diagnosis not present

## 2019-10-27 LAB — POCT INR: INR: 1.8 — AB (ref 2.0–3.0)

## 2019-10-27 NOTE — Patient Instructions (Addendum)
Description   Take 1 tablet today, then continue same dosage 1 tablet daily except take 1/2 tablet on Mondays, Wednesdays and Fridays.  Recheck INR in 3 weeks. Call [facility] with any questions [phone].

## 2019-11-20 ENCOUNTER — Ambulatory Visit (INDEPENDENT_AMBULATORY_CARE_PROVIDER_SITE_OTHER): Payer: Medicare HMO | Admitting: *Deleted

## 2019-11-20 ENCOUNTER — Other Ambulatory Visit: Payer: Self-pay

## 2019-11-20 DIAGNOSIS — Z952 Presence of prosthetic heart valve: Secondary | ICD-10-CM

## 2019-11-20 DIAGNOSIS — Z5181 Encounter for therapeutic drug level monitoring: Secondary | ICD-10-CM

## 2019-11-20 LAB — POCT INR: INR: 2.2 (ref 2.0–3.0)

## 2019-11-20 NOTE — Patient Instructions (Signed)
Description   Continue same dosage 1 tablet daily except take 1/2 tablet on Mondays, Wednesdays and Fridays.  Recheck INR in 4 weeks. Call [facility] with any questions [phone].

## 2019-12-03 ENCOUNTER — Other Ambulatory Visit (HOSPITAL_COMMUNITY): Payer: Medicare HMO

## 2019-12-08 ENCOUNTER — Other Ambulatory Visit: Payer: Self-pay | Admitting: Family Medicine

## 2019-12-08 DIAGNOSIS — Z1231 Encounter for screening mammogram for malignant neoplasm of breast: Secondary | ICD-10-CM

## 2019-12-12 DIAGNOSIS — H35371 Puckering of macula, right eye: Secondary | ICD-10-CM | POA: Diagnosis not present

## 2019-12-12 DIAGNOSIS — H43813 Vitreous degeneration, bilateral: Secondary | ICD-10-CM | POA: Diagnosis not present

## 2019-12-12 DIAGNOSIS — H34232 Retinal artery branch occlusion, left eye: Secondary | ICD-10-CM | POA: Diagnosis not present

## 2019-12-18 ENCOUNTER — Other Ambulatory Visit: Payer: Self-pay

## 2019-12-18 ENCOUNTER — Ambulatory Visit: Payer: Medicare HMO | Admitting: *Deleted

## 2019-12-18 DIAGNOSIS — Z952 Presence of prosthetic heart valve: Secondary | ICD-10-CM

## 2019-12-18 DIAGNOSIS — Z5181 Encounter for therapeutic drug level monitoring: Secondary | ICD-10-CM

## 2019-12-18 LAB — POCT INR: INR: 1.5 — AB (ref 2.0–3.0)

## 2019-12-18 NOTE — Patient Instructions (Signed)
Description   Take 1.5 tablets today, then continue same dosage 1 tablet daily except take 1/2 tablet on Mondays, Wednesdays and Fridays.  Recheck INR in 2 weeks. Call [facility] with any questions [phone].

## 2020-01-01 ENCOUNTER — Other Ambulatory Visit: Payer: Self-pay

## 2020-01-01 ENCOUNTER — Ambulatory Visit: Payer: Medicare HMO | Admitting: *Deleted

## 2020-01-01 DIAGNOSIS — Z5181 Encounter for therapeutic drug level monitoring: Secondary | ICD-10-CM | POA: Diagnosis not present

## 2020-01-01 DIAGNOSIS — Z952 Presence of prosthetic heart valve: Secondary | ICD-10-CM

## 2020-01-01 LAB — POCT INR: INR: 1.5 — AB (ref 2.0–3.0)

## 2020-01-01 NOTE — Patient Instructions (Signed)
Description   Take 1.5 tablets today, start taking 1 tablet daily except take 1/2 tablet on Mondays and Wednesdays.  Recheck INR in  2 weeks. Call [facility] with any questions [phone].

## 2020-01-09 DIAGNOSIS — H43813 Vitreous degeneration, bilateral: Secondary | ICD-10-CM | POA: Diagnosis not present

## 2020-01-09 DIAGNOSIS — H35371 Puckering of macula, right eye: Secondary | ICD-10-CM | POA: Diagnosis not present

## 2020-01-09 DIAGNOSIS — H34232 Retinal artery branch occlusion, left eye: Secondary | ICD-10-CM | POA: Diagnosis not present

## 2020-01-15 ENCOUNTER — Ambulatory Visit: Payer: Medicare HMO

## 2020-01-15 ENCOUNTER — Other Ambulatory Visit: Payer: Self-pay | Admitting: Cardiology

## 2020-01-15 ENCOUNTER — Other Ambulatory Visit: Payer: Self-pay

## 2020-01-15 DIAGNOSIS — Z5181 Encounter for therapeutic drug level monitoring: Secondary | ICD-10-CM

## 2020-01-15 DIAGNOSIS — Z952 Presence of prosthetic heart valve: Secondary | ICD-10-CM | POA: Diagnosis not present

## 2020-01-15 LAB — POCT INR: INR: 1.8 — AB (ref 2.0–3.0)

## 2020-01-15 NOTE — Patient Instructions (Signed)
Description   Take 1.5 tablets today, start taking 1 tablet daily except take 1/2 tablet on Mondays.  Recheck INR in  2 weeks. Call [facility] with any questions [phone].

## 2020-01-21 DIAGNOSIS — R69 Illness, unspecified: Secondary | ICD-10-CM | POA: Diagnosis not present

## 2020-01-22 ENCOUNTER — Other Ambulatory Visit (HOSPITAL_COMMUNITY): Payer: Medicare HMO

## 2020-01-23 ENCOUNTER — Ambulatory Visit: Payer: Medicare HMO

## 2020-01-29 ENCOUNTER — Other Ambulatory Visit: Payer: Self-pay

## 2020-01-29 ENCOUNTER — Ambulatory Visit: Payer: Medicare HMO | Admitting: *Deleted

## 2020-01-29 DIAGNOSIS — Z5181 Encounter for therapeutic drug level monitoring: Secondary | ICD-10-CM | POA: Diagnosis not present

## 2020-01-29 DIAGNOSIS — Z952 Presence of prosthetic heart valve: Secondary | ICD-10-CM | POA: Diagnosis not present

## 2020-01-29 LAB — POCT INR: INR: 2.7 (ref 2.0–3.0)

## 2020-01-29 NOTE — Patient Instructions (Addendum)
Description   Take 1/2 a tablet today,  Then continue taking 1 tablet daily except take 1/2 tablet on Mondays.  Recheck INR in  2 weeks. Call [facility] with any questions [phone].

## 2020-02-12 ENCOUNTER — Ambulatory Visit: Payer: Medicare HMO | Admitting: *Deleted

## 2020-02-12 ENCOUNTER — Other Ambulatory Visit: Payer: Self-pay

## 2020-02-12 ENCOUNTER — Ambulatory Visit (HOSPITAL_COMMUNITY): Payer: Medicare HMO | Attending: Cardiology

## 2020-02-12 DIAGNOSIS — I5032 Chronic diastolic (congestive) heart failure: Secondary | ICD-10-CM

## 2020-02-12 DIAGNOSIS — Z952 Presence of prosthetic heart valve: Secondary | ICD-10-CM | POA: Diagnosis not present

## 2020-02-12 DIAGNOSIS — Z5181 Encounter for therapeutic drug level monitoring: Secondary | ICD-10-CM

## 2020-02-12 LAB — POCT INR: INR: 2.8 (ref 2.0–3.0)

## 2020-02-12 NOTE — Patient Instructions (Signed)
Description   Take 1/2 a tablet today, then start taking 1 tablet daily except take 1/2 tablet on Mondays and Wednesdays.  Recheck INR in  3 weeks. Call [facility] with any questions [phone].

## 2020-02-13 ENCOUNTER — Ambulatory Visit
Admission: RE | Admit: 2020-02-13 | Discharge: 2020-02-13 | Disposition: A | Discharge status: Home / Self Care | Payer: Medicare HMO | Source: Ambulatory Visit | Attending: Family Medicine | Admitting: Family Medicine

## 2020-02-13 DIAGNOSIS — Z1231 Encounter for screening mammogram for malignant neoplasm of breast: Secondary | ICD-10-CM | POA: Diagnosis not present

## 2020-03-02 DIAGNOSIS — E785 Hyperlipidemia, unspecified: Secondary | ICD-10-CM | POA: Diagnosis not present

## 2020-03-02 DIAGNOSIS — Z7901 Long term (current) use of anticoagulants: Secondary | ICD-10-CM | POA: Diagnosis not present

## 2020-03-02 DIAGNOSIS — M8580 Other specified disorders of bone density and structure, unspecified site: Secondary | ICD-10-CM | POA: Diagnosis not present

## 2020-03-02 DIAGNOSIS — I10 Essential (primary) hypertension: Secondary | ICD-10-CM | POA: Diagnosis not present

## 2020-03-02 DIAGNOSIS — Z952 Presence of prosthetic heart valve: Secondary | ICD-10-CM | POA: Diagnosis not present

## 2020-03-04 ENCOUNTER — Other Ambulatory Visit: Payer: Self-pay

## 2020-03-04 ENCOUNTER — Ambulatory Visit: Payer: Medicare HMO | Admitting: *Deleted

## 2020-03-04 DIAGNOSIS — Z952 Presence of prosthetic heart valve: Secondary | ICD-10-CM | POA: Diagnosis not present

## 2020-03-04 DIAGNOSIS — Z5181 Encounter for therapeutic drug level monitoring: Secondary | ICD-10-CM | POA: Diagnosis not present

## 2020-03-04 LAB — POCT INR: INR: 2.7 (ref 2.0–3.0)

## 2020-03-04 NOTE — Patient Instructions (Addendum)
Description   Take 1/2 a tablet today, then continue taking 1 tablet daily except take 1/2 tablet on Mondays and Wednesdays.  Recheck INR in 3 weeks. Call [facility] with any questions [phone].

## 2020-03-28 DIAGNOSIS — E860 Dehydration: Secondary | ICD-10-CM | POA: Diagnosis not present

## 2020-03-28 DIAGNOSIS — K529 Noninfective gastroenteritis and colitis, unspecified: Secondary | ICD-10-CM | POA: Diagnosis not present

## 2020-03-31 DIAGNOSIS — I5032 Chronic diastolic (congestive) heart failure: Secondary | ICD-10-CM | POA: Diagnosis not present

## 2020-03-31 DIAGNOSIS — U071 COVID-19: Secondary | ICD-10-CM | POA: Diagnosis not present

## 2020-03-31 DIAGNOSIS — I10 Essential (primary) hypertension: Secondary | ICD-10-CM | POA: Diagnosis not present

## 2020-03-31 LAB — PROTIME-INR: INR: 3.8 — AB (ref 0.9–1.1)

## 2020-04-01 ENCOUNTER — Emergency Department (HOSPITAL_COMMUNITY): Payer: Medicare HMO

## 2020-04-01 ENCOUNTER — Other Ambulatory Visit: Payer: Self-pay

## 2020-04-01 ENCOUNTER — Inpatient Hospital Stay (HOSPITAL_COMMUNITY)
Admission: EM | Admit: 2020-04-01 | Discharge: 2020-04-05 | DRG: 177 | Disposition: A | Discharge status: Home Health | Payer: Medicare HMO | Attending: Internal Medicine | Admitting: Internal Medicine

## 2020-04-01 ENCOUNTER — Ambulatory Visit (INDEPENDENT_AMBULATORY_CARE_PROVIDER_SITE_OTHER): Payer: Medicare HMO

## 2020-04-01 DIAGNOSIS — Z5181 Encounter for therapeutic drug level monitoring: Secondary | ICD-10-CM

## 2020-04-01 DIAGNOSIS — Z8249 Family history of ischemic heart disease and other diseases of the circulatory system: Secondary | ICD-10-CM | POA: Diagnosis not present

## 2020-04-01 DIAGNOSIS — E875 Hyperkalemia: Secondary | ICD-10-CM | POA: Diagnosis not present

## 2020-04-01 DIAGNOSIS — U071 COVID-19: Principal | ICD-10-CM

## 2020-04-01 DIAGNOSIS — I110 Hypertensive heart disease with heart failure: Secondary | ICD-10-CM | POA: Diagnosis present

## 2020-04-01 DIAGNOSIS — Z79899 Other long term (current) drug therapy: Secondary | ICD-10-CM

## 2020-04-01 DIAGNOSIS — Z952 Presence of prosthetic heart valve: Secondary | ICD-10-CM | POA: Diagnosis not present

## 2020-04-01 DIAGNOSIS — E860 Dehydration: Secondary | ICD-10-CM | POA: Diagnosis present

## 2020-04-01 DIAGNOSIS — I5089 Other heart failure: Secondary | ICD-10-CM | POA: Diagnosis not present

## 2020-04-01 DIAGNOSIS — M8580 Other specified disorders of bone density and structure, unspecified site: Secondary | ICD-10-CM | POA: Diagnosis present

## 2020-04-01 DIAGNOSIS — I951 Orthostatic hypotension: Secondary | ICD-10-CM | POA: Diagnosis present

## 2020-04-01 DIAGNOSIS — E871 Hypo-osmolality and hyponatremia: Secondary | ICD-10-CM | POA: Diagnosis not present

## 2020-04-01 DIAGNOSIS — R0602 Shortness of breath: Secondary | ICD-10-CM | POA: Diagnosis not present

## 2020-04-01 DIAGNOSIS — Z7901 Long term (current) use of anticoagulants: Secondary | ICD-10-CM

## 2020-04-01 DIAGNOSIS — K5900 Constipation, unspecified: Secondary | ICD-10-CM | POA: Diagnosis not present

## 2020-04-01 DIAGNOSIS — I959 Hypotension, unspecified: Secondary | ICD-10-CM | POA: Diagnosis not present

## 2020-04-01 DIAGNOSIS — R42 Dizziness and giddiness: Secondary | ICD-10-CM | POA: Diagnosis not present

## 2020-04-01 DIAGNOSIS — I10 Essential (primary) hypertension: Secondary | ICD-10-CM | POA: Diagnosis not present

## 2020-04-01 DIAGNOSIS — R0902 Hypoxemia: Secondary | ICD-10-CM

## 2020-04-01 DIAGNOSIS — E785 Hyperlipidemia, unspecified: Secondary | ICD-10-CM | POA: Diagnosis present

## 2020-04-01 DIAGNOSIS — I5032 Chronic diastolic (congestive) heart failure: Secondary | ICD-10-CM | POA: Diagnosis not present

## 2020-04-01 DIAGNOSIS — J1282 Pneumonia due to coronavirus disease 2019: Secondary | ICD-10-CM | POA: Diagnosis not present

## 2020-04-01 DIAGNOSIS — J9601 Acute respiratory failure with hypoxia: Secondary | ICD-10-CM | POA: Diagnosis present

## 2020-04-01 DIAGNOSIS — K529 Noninfective gastroenteritis and colitis, unspecified: Secondary | ICD-10-CM | POA: Diagnosis present

## 2020-04-01 LAB — COMPREHENSIVE METABOLIC PANEL
ALT: 19 U/L (ref 0–44)
AST: 61 U/L — ABNORMAL HIGH (ref 15–41)
Albumin: 2.7 g/dL — ABNORMAL LOW (ref 3.5–5.0)
Alkaline Phosphatase: 65 U/L (ref 38–126)
Anion gap: 9 (ref 5–15)
BUN: 17 mg/dL (ref 8–23)
CO2: 22 mmol/L (ref 22–32)
Calcium: 7.5 mg/dL — ABNORMAL LOW (ref 8.9–10.3)
Chloride: 99 mmol/L (ref 98–111)
Creatinine, Ser: 0.95 mg/dL (ref 0.44–1.00)
GFR calc Af Amer: >60 mL/min (ref >60)
GFR calc non Af Amer: 55 mL/min — ABNORMAL LOW (ref >60)
Glucose, Bld: 102 mg/dL — ABNORMAL HIGH (ref 70–99)
Potassium: 5 mmol/L (ref 3.5–5.1)
Sodium: 130 mmol/L — ABNORMAL LOW (ref 135–145)
Total Bilirubin: 1.1 mg/dL (ref 0.3–1.2)
Total Protein: 5.3 g/dL — ABNORMAL LOW (ref 6.5–8.1)

## 2020-04-01 LAB — CBC WITH DIFFERENTIAL/PLATELET
Abs Immature Granulocytes: 0.04 10*3/uL (ref 0.00–0.07)
Basophils Absolute: 0 10*3/uL (ref 0.0–0.1)
Basophils Relative: 0 %
Eosinophils Absolute: 0 10*3/uL (ref 0.0–0.5)
Eosinophils Relative: 0 %
HCT: 39.3 % (ref 36.0–46.0)
Hemoglobin: 12.4 g/dL (ref 12.0–15.0)
Immature Granulocytes: 1 %
Lymphocytes Relative: 13 %
Lymphs Abs: 1 10*3/uL (ref 0.7–4.0)
MCH: 27.9 pg (ref 26.0–34.0)
MCHC: 31.6 g/dL (ref 30.0–36.0)
MCV: 88.5 fL (ref 80.0–100.0)
Monocytes Absolute: 0.6 10*3/uL (ref 0.1–1.0)
Monocytes Relative: 9 %
Neutro Abs: 5.5 10*3/uL (ref 1.7–7.7)
Neutrophils Relative %: 77 %
Platelets: 206 10*3/uL (ref 150–400)
RBC: 4.44 MIL/uL (ref 3.87–5.11)
RDW: 15.5 % (ref 11.5–15.5)
WBC: 7.1 10*3/uL (ref 4.0–10.5)
nRBC: 0 % (ref 0.0–0.2)

## 2020-04-01 LAB — CBC
HCT: 38.7 % (ref 36.0–46.0)
Hemoglobin: 12.7 g/dL (ref 12.0–15.0)
MCH: 28.2 pg (ref 26.0–34.0)
MCHC: 32.8 g/dL (ref 30.0–36.0)
MCV: 85.8 fL (ref 80.0–100.0)
Platelets: 206 10*3/uL (ref 150–400)
RBC: 4.51 MIL/uL (ref 3.87–5.11)
RDW: 15.5 % (ref 11.5–15.5)
WBC: 6.9 10*3/uL (ref 4.0–10.5)
nRBC: 0 % (ref 0.0–0.2)

## 2020-04-01 LAB — LACTATE DEHYDROGENASE: LDH: 412 U/L — ABNORMAL HIGH (ref 98–192)

## 2020-04-01 LAB — TRIGLYCERIDES: Triglycerides: 105 mg/dL (ref <150)

## 2020-04-01 LAB — FIBRINOGEN: Fibrinogen: 578 mg/dL — ABNORMAL HIGH (ref 210–475)

## 2020-04-01 LAB — PROCALCITONIN: Procalcitonin: <0.1 ng/mL

## 2020-04-01 LAB — SARS CORONAVIRUS 2 BY RT PCR (HOSPITAL ORDER, PERFORMED IN ~~LOC~~ HOSPITAL LAB): SARS Coronavirus 2: POSITIVE — AB

## 2020-04-01 LAB — PROTIME-INR
INR: 2.5 — ABNORMAL HIGH (ref 0.8–1.2)
Prothrombin Time: 26.2 seconds — ABNORMAL HIGH (ref 11.4–15.2)

## 2020-04-01 LAB — LACTIC ACID, PLASMA: Lactic Acid, Venous: 1.4 mmol/L (ref 0.5–1.9)

## 2020-04-01 LAB — ABO/RH: ABO/RH(D): O POS

## 2020-04-01 LAB — C-REACTIVE PROTEIN: CRP: 7 mg/dL — ABNORMAL HIGH (ref <1.0)

## 2020-04-01 LAB — FERRITIN: Ferritin: 452 ng/mL — ABNORMAL HIGH (ref 11–307)

## 2020-04-01 LAB — D-DIMER, QUANTITATIVE: D-Dimer, Quant: 0.73 ug/mL-FEU — ABNORMAL HIGH (ref 0.00–0.50)

## 2020-04-01 MED ORDER — HYDROCOD POLST-CPM POLST ER 10-8 MG/5ML PO SUER
5.0000 mL | Freq: Two times a day (BID) | ORAL | Status: DC
  Administered 2020-04-01 – 2020-04-05 (×8): 5 mL via ORAL
  Filled 2020-04-01 (×8): qty 5

## 2020-04-01 MED ORDER — BIOTIN 5000 MCG PO TABS: 1.0000 | ORAL_TABLET | ORAL | Status: DC

## 2020-04-01 MED ORDER — SODIUM CHLORIDE 0.9 % IV SOLN
200.0000 mg | Freq: Once | INTRAVENOUS | Status: AC
  Administered 2020-04-01: 200 mg via INTRAVENOUS
  Filled 2020-04-01: qty 40

## 2020-04-01 MED ORDER — SODIUM CHLORIDE 0.9 % IV BOLUS
500.0000 mL | Freq: Once | INTRAVENOUS | Status: AC
  Administered 2020-04-01: 500 mL via INTRAVENOUS

## 2020-04-01 MED ORDER — WARFARIN SODIUM 5 MG PO TABS
5.0000 mg | ORAL_TABLET | Freq: Once | ORAL | Status: AC
  Administered 2020-04-01: 5 mg via ORAL
  Filled 2020-04-01: qty 1

## 2020-04-01 MED ORDER — ACETAMINOPHEN 650 MG RE SUPP: 650.0000 mg | Freq: Four times a day (QID) | RECTAL | Status: DC | PRN

## 2020-04-01 MED ORDER — B COMPLEX-C PO TABS
1.0000 | ORAL_TABLET | Freq: Every day | ORAL | Status: DC
  Administered 2020-04-02 – 2020-04-05 (×4): 1 via ORAL
  Filled 2020-04-01 (×4): qty 1

## 2020-04-01 MED ORDER — ATORVASTATIN CALCIUM 10 MG PO TABS
10.0000 mg | ORAL_TABLET | Freq: Every day | ORAL | Status: DC
  Administered 2020-04-02 – 2020-04-04 (×3): 10 mg via ORAL
  Filled 2020-04-01 (×3): qty 1

## 2020-04-01 MED ORDER — ALBUTEROL SULFATE HFA 108 (90 BASE) MCG/ACT IN AERS
1.0000 | INHALATION_SPRAY | RESPIRATORY_TRACT | Status: DC | PRN
  Filled 2020-04-01: qty 6.7

## 2020-04-01 MED ORDER — SODIUM CHLORIDE 0.9 % IV SOLN: 200.0000 mg | Freq: Once | INTRAVENOUS | Status: DC

## 2020-04-01 MED ORDER — GUAIFENESIN-DM 100-10 MG/5ML PO SYRP: 10.0000 mL | ORAL_SOLUTION | ORAL | Status: DC | PRN

## 2020-04-01 MED ORDER — ASCORBIC ACID 500 MG PO TABS
1000.0000 mg | ORAL_TABLET | Freq: Every day | ORAL | Status: DC
  Administered 2020-04-02 – 2020-04-05 (×4): 1000 mg via ORAL
  Filled 2020-04-01 (×4): qty 2

## 2020-04-01 MED ORDER — DEXAMETHASONE SODIUM PHOSPHATE 10 MG/ML IJ SOLN
6.0000 mg | INTRAMUSCULAR | Status: DC
  Administered 2020-04-01 – 2020-04-04 (×4): 6 mg via INTRAVENOUS
  Filled 2020-04-01 (×4): qty 1

## 2020-04-01 MED ORDER — VITAMIN D 25 MCG (1000 UNIT) PO TABS
2000.0000 [IU] | ORAL_TABLET | Freq: Every day | ORAL | Status: DC
  Administered 2020-04-02 – 2020-04-05 (×4): 2000 [IU] via ORAL
  Filled 2020-04-01 (×4): qty 2

## 2020-04-01 MED ORDER — CALCIUM CARBONATE-VITAMIN D 500-200 MG-UNIT PO TABS
1.0000 | ORAL_TABLET | Freq: Two times a day (BID) | ORAL | Status: DC
  Administered 2020-04-02 – 2020-04-05 (×7): 1 via ORAL
  Filled 2020-04-01 (×7): qty 1

## 2020-04-01 MED ORDER — IPRATROPIUM-ALBUTEROL 20-100 MCG/ACT IN AERS
1.0000 | INHALATION_SPRAY | Freq: Four times a day (QID) | RESPIRATORY_TRACT | Status: DC
  Administered 2020-04-02 – 2020-04-05 (×14): 1 via RESPIRATORY_TRACT
  Filled 2020-04-01: qty 4

## 2020-04-01 MED ORDER — ZINC SULFATE 220 (50 ZN) MG PO CAPS
220.0000 mg | ORAL_CAPSULE | Freq: Every day | ORAL | Status: DC
  Administered 2020-04-02 – 2020-04-05 (×4): 220 mg via ORAL
  Filled 2020-04-01 (×4): qty 1

## 2020-04-01 MED ORDER — ACETAMINOPHEN 325 MG PO TABS
650.0000 mg | ORAL_TABLET | Freq: Four times a day (QID) | ORAL | Status: DC | PRN
  Administered 2020-04-02: 650 mg via ORAL
  Filled 2020-04-01: qty 2

## 2020-04-01 MED ORDER — ONDANSETRON HCL 4 MG/2ML IJ SOLN: 4.0000 mg | Freq: Four times a day (QID) | INTRAMUSCULAR | Status: DC | PRN

## 2020-04-01 MED ORDER — WARFARIN SODIUM 5 MG PO TABS: 5.0000 mg | ORAL_TABLET | Freq: Once | ORAL | Status: DC

## 2020-04-01 MED ORDER — OMEGA-3 180-270 MG PO CAPS: 1.0000 | ORAL_CAPSULE | Freq: Every day | ORAL | Status: DC

## 2020-04-01 MED ORDER — ADULT MULTIVITAMIN W/MINERALS CH
1.0000 | ORAL_TABLET | Freq: Every day | ORAL | Status: DC
  Administered 2020-04-02 – 2020-04-05 (×4): 1 via ORAL
  Filled 2020-04-01 (×4): qty 1

## 2020-04-01 MED ORDER — ONDANSETRON HCL 4 MG PO TABS: 4.0000 mg | ORAL_TABLET | Freq: Four times a day (QID) | ORAL | Status: DC | PRN

## 2020-04-01 MED ORDER — SODIUM CHLORIDE 0.9 % IV SOLN
100.0000 mg | Freq: Every day | INTRAVENOUS | Status: AC
  Administered 2020-04-02 – 2020-04-05 (×4): 100 mg via INTRAVENOUS
  Filled 2020-04-01 (×5): qty 20

## 2020-04-01 MED ORDER — WARFARIN - PHARMACIST DOSING INPATIENT: Freq: Every day | Status: DC

## 2020-04-01 MED ORDER — MAGNESIUM OXIDE 400 (241.3 MG) MG PO TABS
800.0000 mg | ORAL_TABLET | Freq: Every day | ORAL | Status: DC
  Administered 2020-04-02 – 2020-04-05 (×4): 800 mg via ORAL
  Filled 2020-04-01 (×4): qty 2

## 2020-04-01 MED ORDER — SODIUM CHLORIDE 0.9 % IV SOLN: 100.0000 mg | Freq: Every day | INTRAVENOUS | Status: DC

## 2020-04-01 NOTE — ED Provider Notes (Signed)
[facility] EMERGENCY DEPARTMENT Provider Note   CSN: [id number] Arrival date & time: 04/01/20  1429     History Chief Complaint  Patient presents with  . Shortness of Breath  . Fatigue    Tracy Vargas is a 86 y.o. female.  Patient with history of mechanical heart valve, diastolic heart failure, anticoagulation on warfarin --presents to the emergency department today with complaint of weakness, shortness of breath, dehydration.  Patient states that she was diagnosed with coronavirus approximately 12 days ago.  Her symptoms mainly consisted of diarrhea and nausea, decreased appetite.  Today she was more weak and short of breath.  She had a minor cough.  She was found to be orthostatic at home with oxygen saturations in the upper 80s on room air.  Patient was transported by EMS.  She was placed on 2 L oxygen by nasal cannula and given a 500 cc normal saline bolus.  Patient denies any fevers, ear pain, runny nose or sore throat.  She denies any vomiting.  Diarrhea is nonbloody.  Decreased urination but no dysuria.  Denies increasing swelling to her legs or difficulty lying flat.  She did not receive COVID vaccines.         Past Medical History:  Diagnosis Date  . Acquired hyperlipoproteinemia   . Chronic diastolic heart failure (HCC) 09/01/2013  . Diastolic dysfunction   . Dyspnea   . HTN (hypertension)   . Hyperlipidemia   . Osteopenia     Patient Active Problem List   Diagnosis Date Noted  . Encounter for therapeutic drug monitoring 07/19/2016  . Bleeding gastrointestinal   . Acute blood loss anemia 07/13/2016  . GI bleed 07/12/2016  . Chronic diastolic heart failure (HCC) 09/01/2013  . H/O mechanical aortic valve replacement 09/01/2013  . HTN (hypertension)   . Hyperlipidemia   . Dyspnea   . Diastolic dysfunction   . Acquired hyperlipoproteinemia   . Osteopenia     Past Surgical History:  Procedure Laterality Date  . APPENDECTOMY    .  CARDIAC VALVE REPLACEMENT    . CATARACT EXTRACTION    . ESOPHAGOGASTRODUODENOSCOPY N/A 07/14/2016   Procedure: ESOPHAGOGASTRODUODENOSCOPY (EGD);  Surgeon: Dorena Cookey, MD;  Location: [facility] ENDOSCOPY;  Service: Endoscopy;  Laterality: N/A;  . ROTATOR CUFF REPAIR    . TONSILLECTOMY       OB History   No obstetric history on file.     Family History  Problem Relation Age of Onset  . Heart failure Sister     Social History   Tobacco Use  . Smoking status: Never Smoker  . Smokeless tobacco: Never Used  Substance Use Topics  . Alcohol use: No  . Drug use: No    Home Medications Prior to Admission medications   Medication Sig Start Date End Date Taking? Authorizing Provider  amLODipine (NORVASC) 2.5 MG tablet Take 2.5 mg by mouth daily.    [provider]  amoxicillin (AMOXIL) 500 MG capsule Take 4 capsules by mouth as needed. Pt takes 4 capsules prior dental appt. 07/07/19   [provider]  Ascorbic Acid (VITAMIN C) 1000 MG tablet Take 1,000 mg by mouth daily.    [provider]  atorvastatin (LIPITOR) 10 MG tablet Take 10 mg by mouth daily at 6 PM.  09/11/14   [provider]  b complex vitamins capsule Take 1 capsule by mouth daily.    [provider]  Biotin 5000 MCG TABS Take 1 tablet by mouth  3 (three) times a week.    [provider]  calcium citrate-vitamin D (CALCIUM CITRATE + D) 315-200 MG-UNIT tablet Take 2 tablets by mouth 2 (two) times daily.    [provider]  Cholecalciferol (VITAMIN D) 2000 units CAPS Take 1 capsule by mouth daily.    [provider]  Docosahexaenoic Acid-EPA (OMEGA-3) 180-270 MG CAPS Take 1 capsule by mouth daily.    [provider]  docusate sodium (COLACE) 250 MG capsule Take 250 mg by mouth daily.    [provider]  furosemide (LASIX) 20 MG tablet Take 20 mg by mouth every 7 (seven) days. Take once a week per patient . No specific days    [provider]   Glucosamine-MSM-Hyaluronic Acd 750-375-30 MG TABS Take 2 tablets by mouth 2 (two) times daily.    [provider]  lisinopril (PRINIVIL,ZESTRIL) 20 MG tablet Take 20 mg by mouth daily.    [provider]  Magnesium ([employer] TRIPLE MAGNESIUM COMPLEX) 400 MG CAPS Take 2 capsules by mouth daily.    [provider]  pantoprazole (PROTONIX) 40 MG tablet Take 40 mg by mouth as needed.    [provider]  warfarin (JANTOVEN) 5 MG tablet TAKE AS DIRECTED BY        [facility] 01/15/20   Jake Bathe, MD  zinc gluconate 50 MG tablet Take 50 mg by mouth daily.    [provider]    Allergies    Codeine and Sudafed [pseudoephedrine hcl]  Review of Systems   Review of Systems  Constitutional: Positive for fatigue. Negative for fever.  HENT: Negative for rhinorrhea and sore throat.   Eyes: Negative for redness.  Respiratory: Positive for cough and shortness of breath.   Cardiovascular: Negative for chest pain.  Gastrointestinal: Positive for nausea. Negative for abdominal pain, diarrhea and vomiting.  Genitourinary: Negative for dysuria.  Musculoskeletal: Negative for myalgias.  Skin: Negative for rash.  Neurological: Positive for weakness (Generalized). Negative for headaches.    Physical Exam Updated Vital Signs BP 105/78   Pulse 73   Temp 98.3 F (36.8 C) (Oral)   SpO2 96%   Physical Exam Vitals and nursing note reviewed.  Constitutional:      Appearance: She is well-developed.  HENT:     Head: Normocephalic and atraumatic.  Eyes:     General:        Right eye: No discharge.        Left eye: No discharge.     Conjunctiva/sclera: Conjunctivae normal.  Cardiovascular:     Rate and Rhythm: Normal rate and regular rhythm.     Heart sounds: Murmur (Mechanical heart valve noted) present.  Pulmonary:     Effort: Pulmonary effort is normal.     Breath sounds: Rales (mild, scattered) present. No decreased breath sounds, wheezing or  rhonchi.  Chest:     Comments: Well-healed surgical scar noted to chest wall. Abdominal:     Palpations: Abdomen is soft.     Tenderness: There is no abdominal tenderness.  Musculoskeletal:     Cervical back: Normal range of motion and neck supple.  Skin:    General: Skin is warm and dry.  Neurological:     Mental Status: She is alert.     ED Results / Procedures / Treatments   Labs (all labs ordered are listed, but only abnormal results are displayed) Labs Reviewed  COMPREHENSIVE METABOLIC PANEL - Abnormal; Notable for the following components:  Result Value   Sodium 130 (*)    Glucose, Bld 102 (*)    Calcium 7.5 (*)    Total Protein 5.3 (*)    Albumin 2.7 (*)    AST 61 (*)    GFR calc non Af Amer 55 (*)    All other components within normal limits  LACTATE DEHYDROGENASE - Abnormal; Notable for the following components:   LDH 412 (*)    All other components within normal limits  CULTURE, BLOOD (ROUTINE X 2)  CULTURE, BLOOD (ROUTINE X 2)  SARS CORONAVIRUS 2 BY RT PCR (HOSPITAL ORDER, [facility])  LACTIC ACID, PLASMA  CBC WITH DIFFERENTIAL/PLATELET  TRIGLYCERIDES  LACTIC ACID, PLASMA  D-DIMER, QUANTITATIVE (NOT AT [facility])  PROCALCITONIN  FERRITIN  FIBRINOGEN  C-REACTIVE PROTEIN  PROTIME-INR    ED ECG REPORT   Date: 04/01/2020  Rate: 72  Rhythm: normal sinus rhythm  QRS Axis: normal  Intervals: normal  ST/T Wave abnormalities: nonspecific ST/T changes  Conduction Disutrbances:none  Narrative Interpretation:   Old EKG Reviewed: none available due to EMR prob/downtime  I have personally reviewed the EKG tracing and agree with the computerized printout as noted.  Radiology DG Chest Port 1 View  Result Date: 04/01/2020 CLINICAL DATA:  Shortness of breath, hypoxia, COVID-19 positive EXAM: PORTABLE CHEST 1 VIEW COMPARISON:  04/09/2013 FINDINGS: Single frontal view of the chest demonstrates a stable cardiac silhouette, with extensive  calcification of the mitral annulus. Postsurgical changes from median sternotomy. Moderate atherosclerosis within the thoracic aorta. There is interstitial prominence with superimposed ground-glass airspace disease bilaterally, with relative sparing of the left upper lung zone. No effusion or pneumothorax. No acute bony abnormality. IMPRESSION: 1. Bilateral multifocal ground-glass airspace disease sparing the left upper lung zone, consistent with multifocal pneumonia. Appearance and distribution certainly compatible with given history of COVID-19. Electronically Signed   By: Randa Ngo M.D.   On: 04/01/2020 15:36    Procedures Procedures (including critical care time)  Medications Ordered in ED Medications  sodium chloride 0.9 % bolus 500 mL (500 mLs Intravenous New Bag/Given 04/01/20 1434)    ED Course  I have reviewed the triage vital signs and the nursing notes.  Pertinent labs & imaging results that were available during my care of the patient were reviewed by me and considered in my medical decision making (see chart for details).  Patient seen and examined. Work-up initiated.  Additional fluids ordered.  Awaiting chest x-ray.  Vital signs reviewed and are as follows: BP 105/78   Pulse 73   Temp 98.3 F (36.8 C) (Oral)   SpO2 96%   Patient ambulated in the room on room air and oxygen saturation dropped to 84% - 87%.  She was orthostatic. BP 82/62 standing.   Orthostatic VS for the past 24 hrs:  BP- Lying Pulse- Lying BP- Sitting Pulse- Sitting BP- Standing at 0 minutes Pulse- Standing at 0 minutes  04/01/20 1431 105/55 69 93/64 76 (!) 84/62 79   3:35 PM CXR reviewed. Consistent with COVID PNA.   3:44 PM Will admit.   3:53 PM Spoke with Dr. Cathlean Sauer who will see/admit.   Tracy Vargas was evaluated in Emergency Department on 04/01/2020 for the symptoms described in the history of present illness. She was evaluated in the context of the global COVID-19 pandemic, which  necessitated consideration that the patient might be at risk for infection with the SARS-CoV-2 virus that causes COVID-19. Institutional protocols and algorithms that pertain to  the evaluation of patients at risk for COVID-19 are in a state of rapid change based on information released by regulatory bodies including the CDC and federal and state organizations. These policies and algorithms were followed during the patient's care in the ED.    MDM Rules/Calculators/A&P                      Admit COVID PNA with hypoxia.   Final Clinical Impression(s) / ED Diagnoses Final diagnoses:  Pneumonia due to COVID-19 virus  Hypoxia  Orthostatic hypotension    Rx / DC Orders ED Discharge Orders    None       Renne Crigler, PA-C 04/01/20 1553    Eber Hong, MD 04/03/20 [phone]

## 2020-04-01 NOTE — H&P (Signed)
History and Physical    Tracy Vargas WNU:[id number] DOB: 28-Sep-1934 DOA: 04/01/2020  PCP: Merri Brunette, MD   Patient coming from: Home   Chief Complaint:  Dyspnea   HPI: Tracy Vargas is a 86 y.o. female with medical history significant of diastolic heart failure, mechanical aortic valve replacement, hypertension, and dyslipidemia.  Patient reports not feeling well for the last 2 weeks.  Her symptoms consisted initially with generalized weakness, malaise and poor appetite.  Then she developed nausea, vomiting, diarrhea and poor oral intake.  Over the last 24 hours she has developed dyspnea which is moderate to severe in intensity, no associated cough, worse with exertion, no improving factors.  Her husband was diagnosed with SARS COVID-19 viral pneumonia, his symptoms started few days before she became symptomatic.  Apparently her family came to visit them on their way home to [place], they had dinner together and then they continue their travel up [place].  All the family members involved in this gathering have tested positive for COVID-19.  She has not been vaccinated for COVID-19.   ED Course: Patient ill looking appearing, dehydrated, hypoxemic down to the 80s on ambulation and positive orthostasis.  Patient received 500 cc of isotonic saline intravenously and was referred for further admission to the hospital.  Review of Systems:  1. General: No fevers, or chills, but positive generalized malaise and weakness  2. ENT: No runny nose or sore throat, no hearing disturbances 3. Pulmonary: positive dyspnea as mentioned in HPI. But no cough, wheezing, or hemoptysis 4. Cardiovascular: No angina, claudication, lower extremity edema, pnd or orthopnea 5. Gastrointestinal: positive nausea and vomiting, no diarrhea or constipation 6. Hematology: No easy bruisability or frequent infections 7. Urology: No dysuria, hematuria or increased urinary frequency 8. Dermatology: No rashes. 9.  Neurology: No seizures or paresthesias 10. Musculoskeletal: No joint pain or deformities  Past Medical History:  Diagnosis Date  . Acquired hyperlipoproteinemia   . Chronic diastolic heart failure (HCC) 09/01/2013  . Diastolic dysfunction   . Dyspnea   . HTN (hypertension)   . Hyperlipidemia   . Osteopenia     Past Surgical History:  Procedure Laterality Date  . APPENDECTOMY    . CARDIAC VALVE REPLACEMENT    . CATARACT EXTRACTION    . ESOPHAGOGASTRODUODENOSCOPY N/A 07/14/2016   Procedure: ESOPHAGOGASTRODUODENOSCOPY (EGD);  Surgeon: Dorena Cookey, MD;  Location: [facility] ENDOSCOPY;  Service: Endoscopy;  Laterality: N/A;  . ROTATOR CUFF REPAIR    . TONSILLECTOMY       reports that she has never smoked. She has never used smokeless tobacco. She reports that she does not drink alcohol or use drugs.  Allergies  Allergen Reactions  . Codeine     Halluciations  . Sudafed [Pseudoephedrine Hcl]     Halluications    Family History  Problem Relation Age of Onset  . Heart failure Sister      Prior to Admission medications   Medication Sig Start Date End Date Taking? Authorizing Provider  amLODipine (NORVASC) 2.5 MG tablet Take 2.5 mg by mouth daily.    [provider]  amoxicillin (AMOXIL) 500 MG capsule Take 4 capsules by mouth as needed. Pt takes 4 capsules prior dental appt. 07/07/19   [provider]  Ascorbic Acid (VITAMIN C) 1000 MG tablet Take 1,000 mg by mouth daily.    [provider]  atorvastatin (LIPITOR) 10 MG tablet Take 10 mg by mouth daily at 6 PM.  09/11/14   [provider]  b complex vitamins capsule Take 1 capsule by mouth daily.    [provider]  Biotin 5000 MCG TABS Take 1 tablet by mouth 3 (three) times a week.    [provider]  calcium citrate-vitamin D (CALCIUM CITRATE + D) 315-200 MG-UNIT tablet Take 2 tablets by mouth 2 (two) times daily.    [provider]  Cholecalciferol (VITAMIN D) 2000 units  CAPS Take 1 capsule by mouth daily.    [provider]  Docosahexaenoic Acid-EPA (OMEGA-3) 180-270 MG CAPS Take 1 capsule by mouth daily.    [provider]  docusate sodium (COLACE) 250 MG capsule Take 250 mg by mouth daily.    [provider]  furosemide (LASIX) 20 MG tablet Take 20 mg by mouth every 7 (seven) days. Take once a week per patient . No specific days    [provider]  Glucosamine-MSM-Hyaluronic Acd 750-375-30 MG TABS Take 2 tablets by mouth 2 (two) times daily.    [provider]  lisinopril (PRINIVIL,ZESTRIL) 20 MG tablet Take 20 mg by mouth daily.    [provider]  Magnesium ([employer] TRIPLE MAGNESIUM COMPLEX) 400 MG CAPS Take 2 capsules by mouth daily.    [provider]  pantoprazole (PROTONIX) 40 MG tablet Take 40 mg by mouth as needed.    [provider]  warfarin (JANTOVEN) 5 MG tablet TAKE AS DIRECTED BY        [facility] 01/15/20   Jake Bathe, MD  zinc gluconate 50 MG tablet Take 50 mg by mouth daily.    [provider]    Physical Exam: Vitals:   04/01/20 1444 04/01/20 1530 04/01/20 1545  BP: 105/78 111/60 109/73  Pulse: 73 67 70  Temp: 98.3 F (36.8 C)    TempSrc: Oral    SpO2: 96% 96% 95%    Vitals:   04/01/20 1444 04/01/20 1530 04/01/20 1545  BP: 105/78 111/60 109/73  Pulse: 73 67 70  Temp: 98.3 F (36.8 C)    TempSrc: Oral    SpO2: 96% 96% 95%   General: deconditioned and ill looking appearing  Neurology: Awake and alert, non focal Head and Neck. Head normocephalic. Neck supple with no adenopathy or thyromegaly.   E ENT: mild pallor, no icterus, oral mucosa dry Cardiovascular: No JVD. S1-S2 present, rhythmic, no gallops, rubs, or murmurs. No lower extremity edema. Pulmonary: positive breath sounds bilaterally,  no wheezing or rhonchi, mild  Rales at bases Gastrointestinal. Abdomen soft with no organomegaly, non tender, no rebound or guarding Skin. No  rashes Musculoskeletal: no joint deformities    Labs on Admission: I have personally reviewed following labs and imaging studies  CBC: Recent Labs  Lab 04/01/20 1459  WBC 7.1  NEUTROABS 5.5  HGB 12.4  HCT 39.3  MCV 88.5  PLT 206   Basic Metabolic Panel: Recent Labs  Lab 04/01/20 1459  NA 130*  K 5.0  CL 99  CO2 22  GLUCOSE 102*  BUN 17  CREATININE 0.95  CALCIUM 7.5*   GFR: CrCl cannot be calculated (Unknown ideal weight.). Liver Function Tests: Recent Labs  Lab 04/01/20 1459  AST 61*  ALT 19  ALKPHOS 65  BILITOT 1.1  PROT 5.3*  ALBUMIN 2.7*   No results for input(s): LIPASE, AMYLASE in the last 168 hours. No results for input(s): AMMONIA in the last 168 hours. Coagulation Profile: Recent Labs  Lab 03/31/20 0000  INR 3.8*   Cardiac Enzymes: No results for input(s):  CKTOTAL, CKMB, CKMBINDEX, TROPONINI in the last 168 hours. BNP (last 3 results) No results for input(s): PROBNP in the last 8760 hours. HbA1C: No results for input(s): HGBA1C in the last 72 hours. CBG: No results for input(s): GLUCAP in the last 168 hours. Lipid Profile: Recent Labs    04/01/20 1431  TRIG 105   Thyroid Function Tests: No results for input(s): TSH, T4TOTAL, FREET4, T3FREE, THYROIDAB in the last 72 hours. Anemia Panel: No results for input(s): VITAMINB12, FOLATE, FERRITIN, TIBC, IRON, RETICCTPCT in the last 72 hours. Urine analysis: No results found for: COLORURINE, APPEARANCEUR, LABSPEC, PHURINE, GLUCOSEU, HGBUR, BILIRUBINUR, KETONESUR, PROTEINUR, UROBILINOGEN, NITRITE, LEUKOCYTESUR  Radiological Exams on Admission: DG Chest Port 1 View  Result Date: 04/01/2020 CLINICAL DATA:  Shortness of breath, hypoxia, COVID-19 positive EXAM: PORTABLE CHEST 1 VIEW COMPARISON:  04/09/2013 FINDINGS: Single frontal view of the chest demonstrates a stable cardiac silhouette, with extensive calcification of the mitral annulus. Postsurgical changes from median sternotomy. Moderate  atherosclerosis within the thoracic aorta. There is interstitial prominence with superimposed ground-glass airspace disease bilaterally, with relative sparing of the left upper lung zone. No effusion or pneumothorax. No acute bony abnormality. IMPRESSION: 1. Bilateral multifocal ground-glass airspace disease sparing the left upper lung zone, consistent with multifocal pneumonia. Appearance and distribution certainly compatible with given history of COVID-19. Electronically Signed   By: Sharlet Salina M.D.   On: 04/01/2020 15:36    EKG: Independently reviewed.  72 bpm, normal axis, normal intervals, sinus rhythm, no ST segment or T wave changes.  Assessment/Plan Principal Problem:   Pneumonia due to COVID-19 virus Active Problems:   HTN (hypertension)   Hyperlipidemia   Chronic diastolic heart failure (HCC)   H/O mechanical aortic valve replacement   Acute respiratory failure with hypoxia (HCC)   86 year old female with significant past medical history for chronic diastolic heart failure, history of mechanical aortic valve replacement, hypertension and dyslipidemia, she is positive for COVID-19, she has been symptomatic for about 2 weeks, recently developed dyspnea.  On her initial physical examination blood pressure 109/73, heart rate 70.  She was orthostatic, blood pressure laying 105 systolic, standing down to 84 systolic.  Her oximetry went down to the 80s on ambulation per ED signout.  Clinically dehydrated, dry mucous membranes, lungs with rales at bases, heart S1-S2, present rhythmic, soft abdomen, no lower extremity edema. Sodium 130, potassium 5.0, chloride 99, bicarb 22, glucose 1 2, BUN 17, creatinine 0.97, white count 7.1, hemoglobin 12.4, hematocrit 39.3, platelets 206.  Her chest radiograph has interstitial infiltrates, right lower lobe, right upper lobe, and left lower lobe.  Patient will be admitted to the hospital with working diagnosis of acute hypoxic respiratory failure due to  SARS COVID-19 viral pneumonia.  1.  Acute hypoxic respiratory failure due to SARS COVID-19 viral pneumonia.  Patient will be admitted to the medical ward, based on the remote telemetry monitoring.  Continue supplemental oxygen per nasal cannula.    COVID-19 Labs  Recent Labs    04/01/20 1459  DDIMER 0.73*  FERRITIN 452*  LDH 412*  CRP 7.0*    No results found for: SARSCOV2NAA  Continue antiviral therapy with remdesivir, steroids with IV dexamethasone, antitussive agents, bronchodilators and airway clearing techniques with incentive spirometer and flutter valve.  Continue to follow-up on inflammatory markers.  2.  Hypertension.  Will hold antihypertensive agents, patient is at home taking amlodipine, lisinopril and furosemide.  3.  Diastolic heart failure.  No signs of acute exacerbation, patient with orthostasis, she  has received IV fluids in the emergency department.  For now will follow a restrictive IV fluid strategy.  Hold on further IV fluids.  4.  Status post mechanical aortic valve replacement.  Continue warfarin, per pharmacy protocol.  5.  Dyslipidemia.  Continue atorvastatin.   Status is: Inpatient  Remains inpatient appropriate because:IV treatments appropriate due to intensity of illness or inability to take PO   Dispo: The patient is from: Home              Anticipated d/c is to: Home              Anticipated d/c date is: > 3 days              Patient currently is not medically stable to d/c.    DVT prophylaxis: Warfarin   Code Status:   full  Family Communication:  No family at the bedside     Consults called:  None   Admission status:  Inpatient.    Shelbylynn Walczyk Gerome Apley MD [facility]   04/01/2020, 3:58 PM

## 2020-04-01 NOTE — Progress Notes (Signed)
ANTICOAGULATION CONSULT NOTE - Initial Consult  Pharmacy Consult for Warfarin Indication: Mechanical Valve  Allergies  Allergen Reactions  . Codeine     Halluciations  . Sudafed [Pseudoephedrine Hcl]     Halluications    Patient Measurements:    Vital Signs: Temp: 98.3 F (36.8 C) (05/27 1444) Temp Source: Oral (05/27 1444) BP: 109/73 (05/27 1545) Pulse Rate: 70 (05/27 1545)  Labs: Recent Labs    03/31/20 0000 04/01/20 1459  HGB  --  12.4  HCT  --  39.3  PLT  --  206  LABPROT  --  26.2*  INR 3.8* 2.5*  CREATININE  --  0.95    CrCl cannot be calculated (Unknown ideal weight.).   Medical History: Past Medical History:  Diagnosis Date  . Acquired hyperlipoproteinemia   . Chronic diastolic heart failure (HCC) 09/01/2013  . Diastolic dysfunction   . Dyspnea   . HTN (hypertension)   . Hyperlipidemia   . Osteopenia    Assessment: 86 year old female presenting to ED 04/01/20 with dyspnea found to be COVID+. Patient takes warfarin prior to admission for mechanical valve. Pharmacy consulted to resume warfarin.  Home warfarin regimen: 2.5 mg every Monday and Wednesday, 5 mg all other days  Last dose of warfarin reported 03/30/2020 AM and patient was advised to hold dose for 5/27 based on INR of 3.8 on 5/26.  INR currently therapeutic at 2.5. Hemoglobin WNL. No issues with bleeding reported.  Goal of Therapy:  INR goal 2.0-2.5 due to history of GIB Monitor platelets by anticoagulation protocol: Yes   Plan:  - Warfarin 5 mg PO x1 tonight - Daily INR and CBC - Monitor for signs and symptoms of bleed     Onisha Cedeno L. Arlester Marker, PharmD [facility] PGY1 Pharmacy Resident [phone] 04/01/20      8:03 PM  Please check AMION for all [facility] Pharmacy phone numbers After 10:00 PM, call the Main Pharmacy [phone]

## 2020-04-01 NOTE — ED Notes (Signed)
Blood Culture was not drawn by previous RN. Pt received antibiotics after first set was drawn.

## 2020-04-01 NOTE — ED Triage Notes (Addendum)
Pt from home, referred to ED [facility] home health after experiencing sob and weakness x12 days. Recently diagnosed with COVID 19 (roughly 9 days ago, per pt). Pt is alert and oriented x4 at this time. No other complaints. EMS reported hypotension on scene. ED intake bp 105/78.

## 2020-04-01 NOTE — Patient Instructions (Addendum)
  Description   Pt is at [facility] being admitted, discussed with Margaretmary Dys, PharmD, will defer dosing to them and await pt discharge to resume Warfarin management. Pt's previous dosage regimen was 1 tablet daily except take 1/2 tablet on Mondays and Wednesdays.  Recheck INR in 1 week or upon discharge. Call [facility] with any questions [phone].

## 2020-04-01 NOTE — ED Notes (Signed)
Pt ambulatory on RA. SpO2 between 84%-87% on RA. Pt placed on 2L [place] and SpO2 currently 98%.

## 2020-04-02 ENCOUNTER — Other Ambulatory Visit: Payer: Self-pay

## 2020-04-02 ENCOUNTER — Encounter (HOSPITAL_COMMUNITY): Payer: Self-pay | Admitting: Internal Medicine

## 2020-04-02 LAB — MAGNESIUM: Magnesium: 2 mg/dL (ref 1.7–2.4)

## 2020-04-02 LAB — CBC
HCT: 38.1 % (ref 36.0–46.0)
Hemoglobin: 12.6 g/dL (ref 12.0–15.0)
MCH: 28.1 pg (ref 26.0–34.0)
MCHC: 33.1 g/dL (ref 30.0–36.0)
MCV: 85 fL (ref 80.0–100.0)
Platelets: 225 10*3/uL (ref 150–400)
RBC: 4.48 MIL/uL (ref 3.87–5.11)
RDW: 15.6 % — ABNORMAL HIGH (ref 11.5–15.5)
WBC: 6.9 10*3/uL (ref 4.0–10.5)
nRBC: 0 % (ref 0.0–0.2)

## 2020-04-02 LAB — OSMOLALITY, URINE: Osmolality, Ur: 550 mOsm/kg (ref 300–900)

## 2020-04-02 LAB — LACTIC ACID, PLASMA: Lactic Acid, Venous: 1.3 mmol/L (ref 0.5–1.9)

## 2020-04-02 LAB — URINALYSIS, ROUTINE W REFLEX MICROSCOPIC
Bilirubin Urine: NEGATIVE
Glucose, UA: 150 mg/dL — AB
Hgb urine dipstick: NEGATIVE
Ketones, ur: NEGATIVE mg/dL
Leukocytes,Ua: NEGATIVE
Nitrite: NEGATIVE
Protein, ur: NEGATIVE mg/dL
Specific Gravity, Urine: 1.02 (ref 1.005–1.030)
pH: 5 (ref 5.0–8.0)

## 2020-04-02 LAB — C-REACTIVE PROTEIN: CRP: 8.5 mg/dL — ABNORMAL HIGH (ref <1.0)

## 2020-04-02 LAB — CREATININE, SERUM
Creatinine, Ser: 0.95 mg/dL (ref 0.44–1.00)
GFR calc Af Amer: >60 mL/min (ref >60)
GFR calc non Af Amer: 55 mL/min — ABNORMAL LOW (ref >60)

## 2020-04-02 LAB — BASIC METABOLIC PANEL
Anion gap: 10 (ref 5–15)
BUN: 21 mg/dL (ref 8–23)
CO2: 20 mmol/L — ABNORMAL LOW (ref 22–32)
Calcium: 7.8 mg/dL — ABNORMAL LOW (ref 8.9–10.3)
Chloride: 99 mmol/L (ref 98–111)
Creatinine, Ser: 0.84 mg/dL (ref 0.44–1.00)
GFR calc Af Amer: >60 mL/min (ref >60)
GFR calc non Af Amer: >60 mL/min (ref >60)
Glucose, Bld: 267 mg/dL — ABNORMAL HIGH (ref 70–99)
Potassium: 4.3 mmol/L (ref 3.5–5.1)
Sodium: 129 mmol/L — ABNORMAL LOW (ref 135–145)

## 2020-04-02 LAB — PROTIME-INR
INR: 2.5 — ABNORMAL HIGH (ref 0.8–1.2)
Prothrombin Time: 26.1 seconds — ABNORMAL HIGH (ref 11.4–15.2)

## 2020-04-02 LAB — SODIUM, URINE, RANDOM: Sodium, Ur: 23 mmol/L

## 2020-04-02 LAB — BRAIN NATRIURETIC PEPTIDE: B Natriuretic Peptide: 605.7 pg/mL — ABNORMAL HIGH (ref 0.0–100.0)

## 2020-04-02 LAB — URIC ACID: Uric Acid, Serum: 4.2 mg/dL (ref 2.5–7.1)

## 2020-04-02 LAB — OSMOLALITY: Osmolality: 279 mOsm/kg (ref 275–295)

## 2020-04-02 MED ORDER — ALUM & MAG HYDROXIDE-SIMETH 200-200-20 MG/5ML PO SUSP: 30.0000 mL | ORAL | Status: DC | PRN

## 2020-04-02 MED ORDER — SODIUM POLYSTYRENE SULFONATE 15 GM/60ML PO SUSP
15.0000 g | Freq: Once | ORAL | Status: AC
  Administered 2020-04-02: 15 g via ORAL
  Filled 2020-04-02: qty 60

## 2020-04-02 MED ORDER — WARFARIN SODIUM 5 MG PO TABS
5.0000 mg | ORAL_TABLET | Freq: Once | ORAL | Status: AC
  Administered 2020-04-02: 5 mg via ORAL
  Filled 2020-04-02: qty 1

## 2020-04-02 MED ORDER — LACTATED RINGERS IV SOLN: INTRAVENOUS | Status: AC

## 2020-04-02 NOTE — Progress Notes (Signed)
Patient admitted to [phone].  A&Ox4.  All admission assessment and history completed and initial nursing assessment complete.  Tele shows NSR at 71.  Bed alarm activated due to orthostatic hypotension in ED.  Patient voiced understanding of how to use call bell to ask for assistance before getting out of bed.  See chart for assessments.

## 2020-04-02 NOTE — Discharge Instructions (Addendum)
Follow with Primary MD Merri Brunette, MD in 7 days   Get CBC, CMP, INR,  2 view Chest X ray -  checked next visit within 1 week by Primary MD    Activity: As tolerated with Full fall precautions use walker/cane & assistance as needed  Disposition Home   Diet: Heart Healthy with 1.5 L fluid restriction per day  Special Instructions: If you have smoked or chewed Tobacco  in the last 2 yrs please stop smoking, stop any regular Alcohol  and or any Recreational drug use.  On your next visit with your primary care physician please Get Medicines reviewed and adjusted.  Please request your Prim.MD to go over all Hospital Tests and Procedure/Radiological results at the follow up, please get all Hospital records sent to your Prim MD by signing hospital release before you go home.  If you experience worsening of your admission symptoms, develop shortness of breath, life threatening emergency, suicidal or homicidal thoughts you must seek medical attention immediately by calling 911 or calling your MD immediately  if symptoms less severe.  You Must read complete instructions/literature along with all the possible adverse reactions/side effects for all the Medicines you take and that have been prescribed to you. Take any new Medicines after you have completely understood and accpet all the possible adverse reactions/side effects.       Person Under Monitoring Name: Tracy Vargas  Location: [address] [place] [address]   Infection Prevention Recommendations for Individuals Confirmed to have, or Being Evaluated for, 2019 Novel Coronavirus (COVID-19) Infection Who Receive Care at Home  Individuals who are confirmed to have, or are being evaluated for, COVID-19 should follow the prevention steps below until a healthcare provider or local or state health department says they can return to normal activities.  Stay home except to get medical care You should restrict activities outside your  home, except for getting medical care. Do not go to work, school, or public areas, and do not use public transportation or taxis.  Call ahead before visiting your doctor Before your medical appointment, call the healthcare provider and tell them that you have, or are being evaluated for, COVID-19 infection. This will help the healthcare provider's office take steps to keep other people from getting infected. Ask your healthcare provider to call the local or state health department.  Monitor your symptoms Seek prompt medical attention if your illness is worsening (e.g., difficulty breathing). Before going to your medical appointment, call the healthcare provider and tell them that you have, or are being evaluated for, COVID-19 infection. Ask your healthcare provider to call the local or state health department.  Wear a facemask You should wear a facemask that covers your nose and mouth when you are in the same room with other people and when you visit a healthcare provider. People who live with or visit you should also wear a facemask while they are in the same room with you.  Separate yourself from other people in your home As much as possible, you should stay in a different room from other people in your home. Also, you should use a separate bathroom, if available.  Avoid sharing household items You should not share dishes, drinking glasses, cups, eating utensils, towels, bedding, or other items with other people in your home. After using these items, you should wash them thoroughly with soap and water.  Cover your coughs and sneezes Cover your mouth and nose with a tissue when you cough or  sneeze, or you can cough or sneeze into your sleeve. Throw used tissues in a lined trash can, and immediately wash your hands with soap and water for at least 20 seconds or use an alcohol-based hand rub.  Wash your [facility] your hands often and thoroughly with soap and water for at least 20  seconds. You can use an alcohol-based hand sanitizer if soap and water are not available and if your hands are not visibly dirty. Avoid touching your eyes, nose, and mouth with unwashed hands.   Prevention Steps for Caregivers and Household Members of Individuals Confirmed to have, or Being Evaluated for, COVID-19 Infection Being Cared for in the Home  If you live with, or provide care at home for, a person confirmed to have, or being evaluated for, COVID-19 infection please follow these guidelines to prevent infection:  Follow healthcare provider's instructions Make sure that you understand and can help the patient follow any healthcare provider instructions for all care.  Provide for the patient's basic needs You should help the patient with basic needs in the home and provide support for getting groceries, prescriptions, and other personal needs.  Monitor the patient's symptoms If they are getting sicker, call his or her medical provider and tell them that the patient has, or is being evaluated for, COVID-19 infection. This will help the healthcare provider's office take steps to keep other people from getting infected. Ask the healthcare provider to call the local or state health department.  Limit the number of people who have contact with the patient  If possible, have only one caregiver for the patient.  Other household members should stay in another home or place of residence. If this is not possible, they should stay  in another room, or be separated from the patient as much as possible. Use a separate bathroom, if available.  Restrict visitors who do not have an essential need to be in the home.  Keep older adults, very young children, and other sick people away from the patient Keep older adults, very young children, and those who have compromised immune systems or chronic health conditions away from the patient. This includes people with chronic heart, lung, or kidney  conditions, diabetes, and cancer.  Ensure good ventilation Make sure that shared spaces in the home have good air flow, such as from an air conditioner or an opened window, weather permitting.  Wash your hands often  Wash your hands often and thoroughly with soap and water for at least 20 seconds. You can use an alcohol based hand sanitizer if soap and water are not available and if your hands are not visibly dirty.  Avoid touching your eyes, nose, and mouth with unwashed hands.  Use disposable paper towels to dry your hands. If not available, use dedicated cloth towels and replace them when they become wet.  Wear a facemask and gloves  Wear a disposable facemask at all times in the room and gloves when you touch or have contact with the patient's blood, body fluids, and/or secretions or excretions, such as sweat, saliva, sputum, nasal mucus, vomit, urine, or feces.  Ensure the mask fits over your nose and mouth tightly, and do not touch it during use.  Throw out disposable facemasks and gloves after using them. Do not reuse.  Wash your hands immediately after removing your facemask and gloves.  If your personal clothing becomes contaminated, carefully remove clothing and launder. Wash your hands after handling contaminated clothing.  Place all used  disposable facemasks, gloves, and other waste in a lined container before disposing them with other household waste.  Remove gloves and wash your hands immediately after handling these items.  Do not share dishes, glasses, or other household items with the patient  Avoid sharing household items. You should not share dishes, drinking glasses, cups, eating utensils, towels, bedding, or other items with a patient who is confirmed to have, or being evaluated for, COVID-19 infection.  After the person uses these items, you should wash them thoroughly with soap and water.  Wash laundry thoroughly  Immediately remove and wash clothes or  bedding that have blood, body fluids, and/or secretions or excretions, such as sweat, saliva, sputum, nasal mucus, vomit, urine, or feces, on them.  Wear gloves when handling laundry from the patient.  Read and follow directions on labels of laundry or clothing items and detergent. In general, wash and dry with the warmest temperatures recommended on the label.  Clean all areas the individual has used often  Clean all touchable surfaces, such as counters, tabletops, doorknobs, bathroom fixtures, toilets, phones, keyboards, tablets, and bedside tables, every day. Also, clean any surfaces that may have blood, body fluids, and/or secretions or excretions on them.  Wear gloves when cleaning surfaces the patient has come in contact with.  Use a diluted bleach solution (e.g., dilute bleach with 1 part bleach and 10 parts water) or a household disinfectant with a label that says EPA-registered for coronaviruses. To make a bleach solution at home, add 1 tablespoon of bleach to 1 quart (4 cups) of water. For a larger supply, add  cup of bleach to 1 gallon (16 cups) of water.  Read labels of cleaning products and follow recommendations provided on product labels. Labels contain instructions for safe and effective use of the cleaning product including precautions you should take when applying the product, such as wearing gloves or eye protection and making sure you have good ventilation during use of the product.  Remove gloves and wash hands immediately after cleaning.  Monitor yourself for signs and symptoms of illness Caregivers and household members are considered close contacts, should monitor their health, and will be asked to limit movement outside of the home to the extent possible. Follow the monitoring steps for close contacts listed on the symptom monitoring form.   ? If you have additional questions, contact your local health department or call the epidemiologist on call at  [phone] (available 24/7). ? This guidance is subject to change. For the most up-to-date guidance from [facility], please refer to their website: TripMetro.hu    Information on my medicine - Coumadin   (Warfarin)  Why was Coumadin prescribed for you? Coumadin was prescribed for you because you have a blood clot or a medical condition that can cause an increased risk of forming blood clots. Blood clots can cause serious health problems by blocking the flow of blood to the heart, lung, or brain. Coumadin can prevent harmful blood clots from forming. As a reminder your indication for Coumadin is:   Blood Clot Prevention After Heart Valve Surgery  What test will check on my response to Coumadin? While on Coumadin (warfarin) you will need to have an INR test regularly to ensure that your dose is keeping you in the desired range. The INR (international normalized ratio) number is calculated from the result of the laboratory test called prothrombin time (PT).  If an INR APPOINTMENT HAS NOT ALREADY BEEN MADE FOR YOU please schedule an appointment to  have this lab work done by your health care provider within 7 days. Your INR goal is usually a number between:  2 to 3 or your provider may give you a more narrow range like 2-2.5.  Ask your health care provider during an office visit what your goal INR is.  What  do you need to  know  About  COUMADIN? Take Coumadin (warfarin) exactly as prescribed by your healthcare provider about the same time each day.  DO NOT stop taking without talking to the doctor who prescribed the medication.  Stopping without other blood clot prevention medication to take the place of Coumadin may increase your risk of developing a new clot or stroke.  Get refills before you run out.  What do you do if you miss a dose? If you miss a dose, take it as soon as you remember on the same day then continue your regularly  scheduled regimen the next day.  Do not take two doses of Coumadin at the same time.  Important Safety Information A possible side effect of Coumadin (Warfarin) is an increased risk of bleeding. You should call your healthcare provider right away if you experience any of the following: ? Bleeding from an injury or your nose that does not stop. ? Unusual colored urine (red or dark brown) or unusual colored stools (red or black). ? Unusual bruising for unknown reasons. ? A serious fall or if you hit your head (even if there is no bleeding).  Some foods or medicines interact with Coumadin (warfarin) and might alter your response to warfarin. To help avoid this: ? Eat a balanced diet, maintaining a consistent amount of Vitamin K. ? Notify your provider about major diet changes you plan to make. ? Avoid alcohol or limit your intake to 1 drink for women and 2 drinks for men per day. (1 drink is 5 oz. wine, 12 oz. beer, or 1.5 oz. liquor.)  Make sure that ANY health care provider who prescribes medication for you knows that you are taking Coumadin (warfarin).  Also make sure the healthcare provider who is monitoring your Coumadin knows when you have started a new medication including herbals and non-prescription products.  Coumadin (Warfarin)  Major Drug Interactions  Increased Warfarin Effect Decreased Warfarin Effect  Alcohol (large quantities) Antibiotics (esp. Septra/Bactrim, Flagyl, Cipro) Amiodarone (Cordarone) Aspirin (ASA) Cimetidine (Tagamet) Megestrol (Megace) NSAIDs (ibuprofen, naproxen, etc.) Piroxicam (Feldene) Propafenone (Rythmol SR) Propranolol (Inderal) Isoniazid (INH) Posaconazole (Noxafil) Barbiturates (Phenobarbital) Carbamazepine (Tegretol) Chlordiazepoxide (Librium) Cholestyramine (Questran) Griseofulvin Oral Contraceptives Rifampin Sucralfate (Carafate) Vitamin K   Coumadin (Warfarin) Major Herbal Interactions  Increased Warfarin Effect Decreased Warfarin  Effect  Garlic Ginseng Ginkgo biloba Coenzyme Q10 Green tea St. John's wort    Coumadin (Warfarin) FOOD Interactions  Eat a consistent number of servings per week of foods HIGH in Vitamin K (1 serving =  cup)  Collards (cooked, or boiled & drained) Kale (cooked, or boiled & drained) Mustard greens (cooked, or boiled & drained) Parsley *serving size only =  cup Spinach (cooked, or boiled & drained) Swiss chard (cooked, or boiled & drained) Turnip greens (cooked, or boiled & drained)  Eat a consistent number of servings per week of foods MEDIUM-HIGH in Vitamin K (1 serving = 1 cup)  Asparagus (cooked, or boiled & drained) Broccoli (cooked, boiled & drained, or raw & chopped) Brussel sprouts (cooked, or boiled & drained) *serving size only =  cup Lettuce, raw (green leaf, endive, romaine) Spinach, raw Turnip greens, raw &  chopped   These websites have more information on Coumadin (warfarin):  FailFactory.se; VeganReport.com.au;

## 2020-04-02 NOTE — Progress Notes (Signed)
PROGRESS NOTE                                                                                                                                                                                                             Patient Demographics:    Tracy Vargas, is a 86 y.o. female, DOB - [birth year], KGO:[id number]  Outpatient Primary MD for the patient is Carol Ada, MD    LOS - 1  Admit date - 04/01/2020    Chief Complaint  Patient presents with  . Shortness of Breath  . Fatigue       Brief Narrative -  Tracy Vargas is a 86 y.o. female with medical history significant of diastolic heart failure, mechanical aortic valve replacement, hypertension, and dyslipidemia.  Patient reports not feeling well for the last 2 weeks.  Her symptoms consisted initially with generalized weakness, malaise and poor appetite.  Then she developed nausea, vomiting, diarrhea and poor oral intake.  Over the last 24 hours she has developed dyspnea which is moderate to severe in intensity, no associated cough, worse with exertion, no improving factors, she was diagnosed with Acute Hypoxic Resp failure due to Acute Covid 19 Pneumonia, dehydration and admitted.   Subjective:    [name] today has, No headache, No chest pain, No abdominal pain - No Nausea, No new weakness tingling or numbness, +ve SOB.   Assessment  & Plan :    1. Acute Hypoxic Resp. Failure due to Acute Covid 19 Viral Pneumonitis with gastroenteritis - she has moderate disease, gastroenteritis seems to have resolved, currently on IV steroids and remdesivir along with IV fluids.  Will monitor and adjust she has consented to Actemra use if needed in the future.  Encouraged the patient to sit up in chair in the daytime use I-S and flutter valve for pulmonary toiletry and then prone in bed when at night.  Will advance activity and titrate down oxygen as possible.  Actemra off label use  - patient was told that if COVID-19 pneumonitis gets worse we might potentially use Actemra off label, patient denies any known history of active diverticulitis, tuberculosis or hepatitis, understands the risks and benefits and wants to proceed with Actemra treatment if required.  SpO2: 92 % O2 Flow Rate (L/min): 2 L/min  Recent Labs  Lab 04/01/20 1459 04/01/20 1654  CRP 7.0*  --   DDIMER 0.73*  --   PROCALCITON <0.10  --   SARSCOV2NAA  --  POSITIVE*    Hepatic Function Latest Ref Rng & Units 04/01/2020 07/12/2016  Total Protein 6.5 - 8.1 g/dL 5.3(L) 6.3(L)  Albumin 3.5 - 5.0 g/dL 2.7(L) 3.8  AST 15 - 41 U/L 61(H) 30  ALT 0 - 44 U/L 19 21  Alk Phosphatase 38 - 126 U/L 65 66  Total Bilirubin 0.3 - 1.2 mg/dL 1.1 0.2(L)    2.  Hypertension.  BP hold amlodipine, lisinopril and furosemide. IVF for now.  3. Chr. Diastolic heart failure EF 60% . Currently dehydrated, hypotensive with Hyponatremia - Gentle IVF and monitor.  4.  Status post mechanical aortic valve replacement.  Continue warfarin, per pharmacy protocol.  5.  Dyslipidemia.  Continue atorvastatin.   Condition - Extremely Guarded  Family Communication  : Husband Deidre Ala [phone] on 04/02/2020  Code Status :  Full  Consults  :  None  Procedures  :  None  PUD Prophylaxis : None  Disposition Plan  :    Status is: Inpatient  Remains inpatient appropriate because:IV treatments appropriate due to intensity of illness or inability to take PO  Dispo: The patient is from: Home              Anticipated d/c is to: Home              Anticipated d/c date is: > 3 days              Patient currently is not medically stable to d/c.  Getting treatment for acute hypoxic respiratory failure caused by COVID-19 pneumonia with IV remdesivir and oxygen.   DVT Prophylaxis  :  Coumadin  Lab Results  Component Value Date   INR 2.5 (H) 04/01/2020   INR 3.8 (A) 03/31/2020   INR 2.7 03/04/2020    Lab Results  Component  Value Date   PLT 206 04/01/2020    Diet :   Diet Order            Diet Heart Room service appropriate? Yes; Fluid consistency: Thin  Diet effective now               Inpatient Medications  Scheduled Meds: . vitamin C  1,000 mg Oral Daily  . atorvastatin  10 mg Oral q1800  . B-complex with vitamin C  1 tablet Oral Daily  . calcium-vitamin D  1 tablet Oral BID  . chlorpheniramine-HYDROcodone  5 mL Oral Q12H  . cholecalciferol  2,000 Units Oral Daily  . dexamethasone (DECADRON) injection  6 mg Intravenous Q24H  . Ipratropium-Albuterol  1 puff Inhalation Q6H  . magnesium oxide  800 mg Oral Daily  . multivitamin with minerals  1 tablet Oral Daily  . sodium polystyrene  15 g Oral Once  . Warfarin - Pharmacist Dosing Inpatient   Does not apply q1600  . zinc sulfate  220 mg Oral Daily   Continuous Infusions: . lactated ringers    . remdesivir 100 mg in NS 100 mL 100 mg (04/02/20 0939)   PRN Meds:.acetaminophen **OR** [DISCONTINUED] acetaminophen, albuterol, guaiFENesin-dextromethorphan, [DISCONTINUED] ondansetron **OR** ondansetron (ZOFRAN) IV  Antibiotics  :    Anti-infectives (From admission, onward)   Start     Dose/Rate Route Frequency Ordered Stop   04/02/20 1000  remdesivir 100 mg in sodium chloride 0.9 % 100 mL IVPB  Status:  Discontinued     100 mg 200 mL/hr  over 30 Minutes Intravenous Daily 04/01/20 2256 04/01/20 2316   04/02/20 1000  remdesivir 100 mg in sodium chloride 0.9 % 100 mL IVPB     100 mg 200 mL/hr over 30 Minutes Intravenous Daily 04/01/20 1603 04/06/20 0959   04/01/20 2256  remdesivir 200 mg in sodium chloride 0.9% 250 mL IVPB  Status:  Discontinued     200 mg 580 mL/hr over 30 Minutes Intravenous Once 04/01/20 2256 04/01/20 2316   04/01/20 1630  remdesivir 200 mg in sodium chloride 0.9% 250 mL IVPB     200 mg 580 mL/hr over 30 Minutes Intravenous Once 04/01/20 1603 04/01/20 1716       Time Spent in minutes  30   Lala Lund M.D on  04/02/2020 at 10:54 AM  To page go to [email or web address] - password [email or web address]  [facility] -  Office  [phone]   See all Orders from today for further details    Objective:   Vitals:   04/02/20 0354 04/02/20 0740 04/02/20 0800 04/02/20 0900  BP: 115/65 (!) 83/43 (!) 87/64 (!) 115/57  Pulse: 71 (!) 47 (!) 50 64  Resp: '14 12 20 18  ' Temp: 97.9 F (36.6 C)   97.9 F (36.6 C)  TempSrc: Oral   Oral  SpO2: 92% 94% 92% 92%  Weight: 70 kg     Height: '5\' 1"'  (1.549 m)       Wt Readings from Last 3 Encounters:  04/02/20 70 kg  09/18/19 71.6 kg  09/10/18 73.3 kg     Intake/Output Summary (Last 24 hours) at 04/02/2020 1054 Last data filed at 04/02/2020 0451 Gross per 24 hour  Intake 240 ml  Output --  Net 240 ml     Physical Exam  Awake Alert, No new F.N deficits, Normal affect Ragan.AT,PERRAL Supple Neck,No JVD, No cervical lymphadenopathy appriciated.  Symmetrical Chest wall movement, Good air movement bilaterally, mild fine rales RRR,No Gallops,Rubs or new Murmurs, No Parasternal Heave +ve B.Sounds, Abd Soft, No tenderness, No organomegaly appriciated, No rebound - guarding or rigidity. No Cyanosis, Clubbing or edema, No new Rash or bruise       Data Review:    CBC Recent Labs  Lab 04/01/20 1459 04/01/20 2335  WBC 7.1 6.9  HGB 12.4 12.7  HCT 39.3 38.7  PLT 206 206  MCV 88.5 85.8  MCH 27.9 28.2  MCHC 31.6 32.8  RDW 15.5 15.5  LYMPHSABS 1.0  --   MONOABS 0.6  --   EOSABS 0.0  --   BASOSABS 0.0  --     Chemistries  Recent Labs  Lab 03/31/20 0000 04/01/20 1459 04/01/20 2335  NA  --  130*  --   K  --  5.0  --   CL  --  99  --   CO2  --  22  --   GLUCOSE  --  102*  --   BUN  --  17  --   CREATININE  --  0.95 0.95  CALCIUM  --  7.5*  --   AST  --  61*  --   ALT  --  19  --   ALKPHOS  --  65  --   BILITOT  --  1.1  --   INR 3.8* 2.5*  --       ------------------------------------------------------------------------------------------------------------------ Recent Labs    04/01/20 1431  TRIG 105    No results found for: HGBA1C ------------------------------------------------------------------------------------------------------------------ No results for input(s): TSH, T4TOTAL, T3FREE, THYROIDAB in  the last 72 hours.  Invalid input(s): FREET3  Cardiac Enzymes No results for input(s): CKMB, TROPONINI, MYOGLOBIN in the last 168 hours.  Invalid input(s): CK ------------------------------------------------------------------------------------------------------------------ No results found for: BNP  Micro Results Recent Results (from the past 240 hour(s))  Blood Culture (routine x 2)     Status: None (Preliminary result)   Collection Time: 04/01/20  2:31 PM   Specimen: BLOOD RIGHT HAND  Result Value Ref Range Status   Specimen Description BLOOD RIGHT HAND  Final   Special Requests   Final    BOTTLES DRAWN AEROBIC AND ANAEROBIC Blood Culture results may not be optimal due to an inadequate volume of blood received in culture bottles   Culture   Final    NO GROWTH < 24 HOURS Performed at [facility], [place] [address], [place], [place] [address]    Report Status PENDING  Incomplete  SARS Coronavirus 2 by RT PCR (hospital order, performed in [facility] hospital lab) Nasopharyngeal Nasopharyngeal Swab     Status: Abnormal   Collection Time: 04/01/20  4:54 PM   Specimen: Nasopharyngeal Swab  Result Value Ref Range Status   SARS Coronavirus 2 POSITIVE (A) NEGATIVE Final    Comment: RESULT CALLED TO, READ BACK BY AND VERIFIED WITH: E FLUECKIGER RN 04/01/20 1945 JDW (NOTE) SARS-CoV-2 target nucleic acids are DETECTED SARS-CoV-2 RNA is generally detectable in upper respiratory specimens  during the acute phase of infection.  Positive results are indicative  of the presence of the identified virus, but do not rule  out bacterial infection or co-infection with other pathogens not detected by the test.  Clinical correlation with patient history and  other diagnostic information is necessary to determine patient infection status.  The expected result is negative. Fact Sheet for Patients:   StrictlyIdeas.no  Fact Sheet for Healthcare Providers:   BankingDealers.co.za   This test is not yet approved or cleared by the [place] FDA and  has been authorized for detection and/or diagnosis of SARS-CoV-2 by FDA under an Emergency Use Authorization (EUA).  This EUA will remain in effect (meaning this test can be  used) for the duration of  the COVID-19 declaration under Section 564(b)(1) of the Act, 21 U.S.C. section 360-bbb-3(b)(1), unless the authorization is terminated or revoked sooner. Performed at [facility], [place] [address], [place], [place] [address]     Radiology Reports DG Chest [place] 1 View  Result Date: 04/01/2020 CLINICAL DATA:  Shortness of breath, hypoxia, COVID-19 positive EXAM: PORTABLE CHEST 1 VIEW COMPARISON:  04/09/2013 FINDINGS: Single frontal view of the chest demonstrates a stable cardiac silhouette, with extensive calcification of the mitral annulus. Postsurgical changes from median sternotomy. Moderate atherosclerosis within the thoracic aorta. There is interstitial prominence with superimposed ground-glass airspace disease bilaterally, with relative sparing of the left upper lung zone. No effusion or pneumothorax. No acute bony abnormality. IMPRESSION: 1. Bilateral multifocal ground-glass airspace disease sparing the left upper lung zone, consistent with multifocal pneumonia. Appearance and distribution certainly compatible with given history of COVID-19. Electronically Signed   By: Randa Ngo M.D.   On: 04/01/2020 15:36

## 2020-04-02 NOTE — Progress Notes (Signed)
   04/02/20 0800  Assess: MEWS Score  BP (!) 87/64  Pulse Rate (!) 50  ECG Heart Rate (!) 50  Resp 20  Level of Consciousness Alert  SpO2 92 %  Assess: MEWS Score  MEWS Temp 0  MEWS Systolic 1  MEWS Pulse 1  MEWS RR 0  MEWS LOC 0  MEWS Score 2  MEWS Score Color Yellow  Assess: if the MEWS score is Yellow or Red  Were vital signs taken at a resting state? Yes  Focused Assessment Documented focused assessment  Early Detection of Sepsis Score *See Row Information* Low  MEWS guidelines implemented *See Row Information* No, vital signs rechecked  Treat  MEWS Interventions Other (Comment) (MD ordered fluids d/t dehydration)  Take Vital Signs  Increase Vital Sign Frequency  Yellow: Q 2hr X 2 then Q 4hr X 2, if remains yellow, continue Q 4hrs  Escalate  MEWS: Escalate Yellow: discuss with charge nurse/RN and consider discussing with provider and RRT  Notify: Charge Nurse/RN  Name of Charge Nurse/RN Notified Rasheen  Date Charge Nurse/RN Notified 04/02/20  Time Charge Nurse/RN Notified 0900    Nurse was not notified at the time BP was taken, when RN noticed BP, was immediately rechecked and had already improved to 110s/50s.  MD ordered maintenance fluids d/t dehydration as well.  Patient asymptomatic at rest, reported minimal dizziness when transferring to chair later in day.

## 2020-04-02 NOTE — Progress Notes (Signed)
ANTICOAGULATION CONSULT NOTE - Follow Up Consult  Pharmacy Consult for Warfarin Indication: mechanical mitral valve  Allergies  Allergen Reactions  . Codeine     Halluciations  . Sudafed [Pseudoephedrine Hcl]     Halluications    Patient Measurements: Height: 5\' 1"  (154.9 cm) Weight: 70 kg (154 lb 5.2 oz) IBW/kg (Calculated) : 47.8  Vital Signs: Temp: 97.8 F (36.6 C) (05/28 1100) Temp Source: Oral (05/28 1100) BP: 105/52 (05/28 1100) Pulse Rate: 60 (05/28 1100)  Labs: Recent Labs    03/31/20 0000 04/01/20 1459 04/01/20 1459 04/01/20 2335 04/02/20 1104  HGB  --  12.4   < > 12.7 12.6  HCT  --  39.3  --  38.7 38.1  PLT  --  206  --  206 225  LABPROT  --  26.2*  --   --  26.1*  INR 3.8* 2.5*  --   --  2.5*  CREATININE  --  0.95  --  0.95 0.84   < > = values in this interval not displayed.    Estimated Creatinine Clearance: 44.6 mL/min (by C-G formula based on SCr of 0.84 mg/dL).  Assessment:  86 year old female presenting to ED 04/01/20 with dyspnea found to be COVID+. Patient takes warfarin prior to admission for mechanical valve. Pharmacy consulted to continue warfarin.  Home warfarin regimen: 5 mg daily except 2.5 mg on Mondays and Wednesdays.    INR supratherapeutic (3.8) on 5/26 and Warfarin held. INR down to 2.5 on 5/27 and usual 5 mg dose given.  INR 2.5 again today, remains therapeutic.  No bleeding reported.  Goal of Therapy:  INR 2.0-2.5 due to history of GI bleed Monitor platelets by anticoagulation protocol: Yes   Plan:  Warfarin 5 mg x 1 again today. Daily PT/INR. Monitor for signs and symptoms of bleeding.  6/27, RPh Phone: [phone] 04/02/2020,1:23 PM

## 2020-04-03 LAB — CBC
HCT: 36.3 % (ref 36.0–46.0)
Hemoglobin: 12 g/dL (ref 12.0–15.0)
MCH: 28 pg (ref 26.0–34.0)
MCHC: 33.1 g/dL (ref 30.0–36.0)
MCV: 84.6 fL (ref 80.0–100.0)
Platelets: 284 10*3/uL (ref 150–400)
RBC: 4.29 MIL/uL (ref 3.87–5.11)
RDW: 15.5 % (ref 11.5–15.5)
WBC: 13.8 10*3/uL — ABNORMAL HIGH (ref 4.0–10.5)
nRBC: 0 % (ref 0.0–0.2)

## 2020-04-03 LAB — MAGNESIUM: Magnesium: 2 mg/dL (ref 1.7–2.4)

## 2020-04-03 LAB — PROTIME-INR
INR: 3.6 — ABNORMAL HIGH (ref 0.8–1.2)
Prothrombin Time: 34.7 seconds — ABNORMAL HIGH (ref 11.4–15.2)

## 2020-04-03 LAB — C-REACTIVE PROTEIN: CRP: 6.1 mg/dL — ABNORMAL HIGH (ref <1.0)

## 2020-04-03 LAB — COMPREHENSIVE METABOLIC PANEL
ALT: 26 U/L (ref 0–44)
AST: 47 U/L — ABNORMAL HIGH (ref 15–41)
Albumin: 2.5 g/dL — ABNORMAL LOW (ref 3.5–5.0)
Alkaline Phosphatase: 61 U/L (ref 38–126)
Anion gap: 10 (ref 5–15)
BUN: 25 mg/dL — ABNORMAL HIGH (ref 8–23)
CO2: 23 mmol/L (ref 22–32)
Calcium: 8.4 mg/dL — ABNORMAL LOW (ref 8.9–10.3)
Chloride: 102 mmol/L (ref 98–111)
Creatinine, Ser: 0.86 mg/dL (ref 0.44–1.00)
GFR calc Af Amer: >60 mL/min (ref >60)
GFR calc non Af Amer: >60 mL/min (ref >60)
Glucose, Bld: 155 mg/dL — ABNORMAL HIGH (ref 70–99)
Potassium: 3.9 mmol/L (ref 3.5–5.1)
Sodium: 135 mmol/L (ref 135–145)
Total Bilirubin: 0.6 mg/dL (ref 0.3–1.2)
Total Protein: 5.3 g/dL — ABNORMAL LOW (ref 6.5–8.1)

## 2020-04-03 LAB — BRAIN NATRIURETIC PEPTIDE: B Natriuretic Peptide: 910.7 pg/mL — ABNORMAL HIGH (ref 0.0–100.0)

## 2020-04-03 LAB — D-DIMER, QUANTITATIVE: D-Dimer, Quant: 1.19 ug/mL-FEU — ABNORMAL HIGH (ref 0.00–0.50)

## 2020-04-03 MED ORDER — POLYETHYLENE GLYCOL 3350 17 G PO PACK
17.0000 g | PACK | Freq: Two times a day (BID) | ORAL | Status: DC
  Administered 2020-04-03 – 2020-04-04 (×3): 17 g via ORAL
  Filled 2020-04-03 (×5): qty 1

## 2020-04-03 MED ORDER — BISACODYL 10 MG RE SUPP
10.0000 mg | Freq: Every day | RECTAL | Status: DC
  Administered 2020-04-03 – 2020-04-04 (×2): 10 mg via RECTAL
  Filled 2020-04-03 (×3): qty 1

## 2020-04-03 NOTE — Plan of Care (Signed)
  Problem: Respiratory: Goal: Will maintain a patent airway Outcome: Progressing Goal: Complications related to the disease process, condition or treatment will be avoided or minimized Outcome: Progressing   

## 2020-04-03 NOTE — Evaluation (Signed)
Physical Therapy Evaluation Patient Details Name: Tracy Vargas MRN: [id number] DOB: 27-Oct-1934 Today's Date: 04/03/2020   History of Present Illness  Pt adm with Covid 19 PNA. PMH - chf, avr, htn  Clinical Impression  Pt admitted with above diagnosis and presents to PT with functional limitations due to deficits listed below (See PT problem list). Pt needs skilled PT to maximize independence and safety to allow discharge to home with family. Expect steady progress.     Follow Up Recommendations Home health PT;Supervision for mobility/OOB    Equipment Recommendations  None recommended by PT    Recommendations for Other Services       Precautions / Restrictions Precautions Precautions: Fall      Mobility  Bed Mobility               General bed mobility comments: Pt up in chair  Transfers Overall transfer level: Needs assistance Equipment used: None Transfers: Sit to/from Stand Sit to Stand: Min guard         General transfer comment: Assist for balance and safety.  Ambulation/Gait Ambulation/Gait assistance: Min guard;Min assist Gait Distance (Feet): 150 Feet Assistive device: 1 person hand held assist;Rolling walker (2 wheeled) Gait Pattern/deviations: Step-through pattern;Decreased step length - right;Decreased step length - left;Drifts right/left Gait velocity: decr Gait velocity interpretation: 1.31 - 2.62 ft/sec, indicative of limited community ambulator General Gait Details: Assist for balance. Min assist with hand held, Min guard with walker.   Stairs            Wheelchair Mobility    Modified Rankin (Stroke Patients Only)       Balance Overall balance assessment: Needs assistance Sitting-balance support: No upper extremity supported;Feet supported Sitting balance-Leahy Scale: Good     Standing balance support: No upper extremity supported;During functional activity Standing balance-Leahy Scale: Fair                                Pertinent Vitals/Pain Pain Assessment: No/denies pain    Home Living Family/patient expects to be discharged to:: Private residence Living Arrangements: Spouse/significant other Available Help at Discharge: Family;Available 24 hours/day(husband weak and sick. Son in law has been helping) Type of Home: House Home Access: Stairs to enter Entrance Stairs-Rails: Right Entrance Stairs-Number of Steps: 1 Home Layout: Two level;Laundry or work area in [employer]: Music therapist Comments: Pt reports church has left them equipment to use    Prior Function Level of Independence: Independent               Hand Dominance        Extremity/Trunk Assessment   Upper Extremity Assessment Upper Extremity Assessment: Defer to OT evaluation    Lower Extremity Assessment Lower Extremity Assessment: Generalized weakness       Communication   Communication: HOH  Cognition Arousal/Alertness: Awake/alert Behavior During Therapy: WFL for tasks assessed/performed Overall Cognitive Status: Within Functional Limits for tasks assessed                                        General Comments General comments (skin integrity, edema, etc.): Amb on 2L of O2 with SpO2 86-87%.     Exercises     Assessment/Plan    PT Assessment Patient needs continued PT services  PT Problem List Decreased strength;Decreased activity tolerance;Decreased balance;Decreased mobility;Cardiopulmonary status limiting  activity       PT Treatment Interventions DME instruction;Gait training;Functional mobility training;Therapeutic activities;Therapeutic exercise;Balance training;Patient/family education    PT Goals (Current goals can be found in the Care Plan section)  Acute Rehab PT Goals Patient Stated Goal: return home PT Goal Formulation: With patient Time For Goal Achievement: 04/17/20 Potential to Achieve Goals: Good    Frequency Min 3X/week    Barriers to discharge        Co-evaluation               AM-PAC PT "6 Clicks" Mobility  Outcome Measure Help needed turning from your back to your side while in a flat bed without using bedrails?: None Help needed moving from lying on your back to sitting on the side of a flat bed without using bedrails?: A Little Help needed moving to and from a bed to a chair (including a wheelchair)?: A Little Help needed standing up from a chair using your arms (e.g., wheelchair or bedside chair)?: A Little Help needed to walk in hospital room?: A Little Help needed climbing 3-5 steps with a railing? : A Little 6 Click Score: 19    End of Session Equipment Utilized During Treatment: Oxygen Activity Tolerance: Patient tolerated treatment well Patient left: in chair;with call bell/phone within reach Nurse Communication: Mobility status PT Visit Diagnosis: Other abnormalities of gait and mobility (R26.89);Unsteadiness on feet (R26.81);Muscle weakness (generalized) (M62.81)    Time: 1750-1809 PT Time Calculation (min) (ACUTE ONLY): 19 min   Charges:   PT Evaluation $PT Eval Moderate Complexity: 1 Mod          [facility] PT Acute Rehabilitation Services Pager [phone] Office [phone]   Angelina Ok [facility] 04/03/2020, 6:17 PM

## 2020-04-03 NOTE — Progress Notes (Signed)
ANTICOAGULATION CONSULT NOTE - Follow Up Consult  Pharmacy Consult for Warfarin Indication: mechanical mitral valve  Allergies  Allergen Reactions  . Codeine     Halluciations  . Sudafed [Pseudoephedrine Hcl]     Halluications    Patient Measurements: Height: 5\' 1"  (154.9 cm) Weight: 70 kg (154 lb 5.2 oz) IBW/kg (Calculated) : 47.8  Vital Signs: Temp: 98 F (36.7 C) (05/29 0825) Temp Source: Oral (05/29 0825) BP: 130/66 (05/29 0825) Pulse Rate: 81 (05/29 0825)  Labs: Recent Labs    04/01/20 1459 04/01/20 1459 04/01/20 2335 04/01/20 2335 04/02/20 1104 04/03/20 0707  HGB 12.4   < > 12.7   < > 12.6 12.0  HCT 39.3   < > 38.7  --  38.1 36.3  PLT 206   < > 206  --  225 284  LABPROT 26.2*  --   --   --  26.1* 34.7*  INR 2.5*  --   --   --  2.5* 3.6*  CREATININE 0.95   < > 0.95  --  0.84 0.86   < > = values in this interval not displayed.    Estimated Creatinine Clearance: 43.6 mL/min (by C-G formula based on SCr of 0.86 mg/dL).  Assessment:  86 year old female presenting to ED 04/01/20 with dyspnea found to be COVID+. Patient takes warfarin prior to admission for mechanical valve. Pharmacy consulted to continue warfarin.  Home warfarin regimen: 5 mg daily except 2.5 mg on Mondays and Wednesdays.    INR supratherapeutic (3.8) on 5/26 and Warfarin held. INR down to 2.5 on 5/27 and usual 5 mg dose given.  INR 2.5 again 5/28. Patient is now supratherapeutic at 3.6.  No bleeding reported.  Goal of Therapy:  INR 2.0-2.5 due to history of GI bleed Monitor platelets by anticoagulation protocol: Yes   Plan:  Hold warfarin dose tonight  Daily PT/INR. Monitor for signs and symptoms of bleeding.  6/28, RPh Phone: [phone] 04/03/2020,12:40 PM

## 2020-04-03 NOTE — Progress Notes (Addendum)
PROGRESS NOTE                                                                                                                                                                                                             Patient Demographics:    Tracy Vargas, is a 86 y.o. female, DOB - Aug 30, 1934, NGB:[id number]  Outpatient Primary MD for the patient is Carol Ada, MD    LOS - 2  Admit date - 04/01/2020    Chief Complaint  Patient presents with  . Shortness of Breath  . Fatigue       Brief Narrative -  Tracy Vargas is a 86 y.o. female with medical history significant of diastolic heart failure, mechanical aortic valve replacement, hypertension, and dyslipidemia.  Patient reports not feeling well for the last 2 weeks.  Her symptoms consisted initially with generalized weakness, malaise and poor appetite.  Then she developed nausea, vomiting, diarrhea and poor oral intake.  Over the last 24 hours she has developed dyspnea which is moderate to severe in intensity, no associated cough, worse with exertion, no improving factors, she was diagnosed with Acute Hypoxic Resp failure due to Acute Covid 19 Pneumonia, dehydration and admitted.   Subjective:   Patient in bed, appears comfortable, denies any headache, no fever, no chest pain or pressure, improving shortness of breath , no abdominal pain but mild nausea and constipation. No focal weakness.   Assessment  & Plan :    1. Acute Hypoxic Resp. Failure due to Acute Covid 19 Viral Pneumonitis with gastroenteritis - she has moderate disease, gastroenteritis seems to have resolved, she has been treated with IV steroids, remdesivir and IV fluids.  Breathing slightly improved we will continue to monitor closely.  Encouraged the patient to sit up in chair in the daytime use I-S and flutter valve for pulmonary toiletry and then prone in bed when at night.  Will advance activity and  titrate down oxygen as possible.  SpO2: 91 % O2 Flow Rate (L/min): 3 L/min  Recent Labs  Lab 04/01/20 1459 04/01/20 1654 04/02/20 1104 04/03/20 0707 04/03/20 0713  CRP 7.0*  --  8.5* 6.1*  --   DDIMER 0.73*  --   --  1.19*  --   BNP  --   --  605.7*  --  910.7*  PROCALCITON <0.10  --   --   --   --  SARSCOV2NAA  --  POSITIVE*  --   --   --     Hepatic Function Latest Ref Rng & Units 04/03/2020 04/01/2020 07/12/2016  Total Protein 6.5 - 8.1 g/dL 5.3(L) 5.3(L) 6.3(L)  Albumin 3.5 - 5.0 g/dL 2.5(L) 2.7(L) 3.8  AST 15 - 41 U/L 47(H) 61(H) 30  ALT 0 - 44 U/L '26 19 21  ' Alk Phosphatase 38 - 126 U/L 61 65 66  Total Bilirubin 0.3 - 1.2 mg/dL 0.6 1.1 0.2(L)    2.  Hypertension.  BP hold amlodipine, lisinopril and furosemide. IVF for now.  3. Chr. Diastolic heart failure EF 60% . Currently dehydrated, hypotensive with Hyponatremia - Gentle IVF and monitor.  4.  Status post mechanical aortic valve replacement.  Continue warfarin, per pharmacy protocol.  5.  Dyslipidemia.  Continue atorvastatin.  6. Constipation.  Placed on bowel regimen will monitor closely.     Condition - Extremely Guarded  Family Communication  : Husband Deidre Ala [phone] on 04/02/2020 and son in law Louie Casa [phone] on 04/03/20   Code Status :  Full  Consults  :  None  Procedures  :  None  PUD Prophylaxis : None  Disposition Plan  :    Status is: Inpatient  Remains inpatient appropriate because:IV treatments appropriate due to intensity of illness or inability to take PO  Dispo: The patient is from: Home              Anticipated d/c is to: Home              Anticipated d/c date is: > 3 days              Patient currently is not medically stable to d/c.  Getting treatment for acute hypoxic respiratory failure caused by COVID-19 pneumonia with IV remdesivir and oxygen.   DVT Prophylaxis  :  Coumadin  Lab Results  Component Value Date   INR 3.6 (H) 04/03/2020   INR 2.5 (H) 04/02/2020    INR 2.5 (H) 04/01/2020    Lab Results  Component Value Date   PLT 284 04/03/2020    Diet :   Diet Order            Diet Heart Room service appropriate? Yes; Fluid consistency: Thin  Diet effective now               Inpatient Medications  Scheduled Meds: . vitamin C  1,000 mg Oral Daily  . atorvastatin  10 mg Oral q1800  . B-complex with vitamin C  1 tablet Oral Daily  . bisacodyl  10 mg Rectal Daily  . calcium-vitamin D  1 tablet Oral BID  . chlorpheniramine-HYDROcodone  5 mL Oral Q12H  . cholecalciferol  2,000 Units Oral Daily  . dexamethasone (DECADRON) injection  6 mg Intravenous Q24H  . Ipratropium-Albuterol  1 puff Inhalation Q6H  . magnesium oxide  800 mg Oral Daily  . multivitamin with minerals  1 tablet Oral Daily  . polyethylene glycol  17 g Oral BID  . Warfarin - Pharmacist Dosing Inpatient   Does not apply q1600  . zinc sulfate  220 mg Oral Daily   Continuous Infusions: . remdesivir 100 mg in NS 100 mL 100 mg (04/02/20 0939)   PRN Meds:.acetaminophen **OR** [DISCONTINUED] acetaminophen, albuterol, alum & mag hydroxide-simeth, guaiFENesin-dextromethorphan, [DISCONTINUED] ondansetron **OR** ondansetron (ZOFRAN) IV  Antibiotics  :    Anti-infectives (From admission, onward)   Start     Dose/Rate Route Frequency Ordered Stop  04/02/20 1000  remdesivir 100 mg in sodium chloride 0.9 % 100 mL IVPB  Status:  Discontinued     100 mg 200 mL/hr over 30 Minutes Intravenous Daily 04/01/20 2256 04/01/20 2316   04/02/20 1000  remdesivir 100 mg in sodium chloride 0.9 % 100 mL IVPB     100 mg 200 mL/hr over 30 Minutes Intravenous Daily 04/01/20 1603 04/06/20 0959   04/01/20 2256  remdesivir 200 mg in sodium chloride 0.9% 250 mL IVPB  Status:  Discontinued     200 mg 580 mL/hr over 30 Minutes Intravenous Once 04/01/20 2256 04/01/20 2316   04/01/20 1630  remdesivir 200 mg in sodium chloride 0.9% 250 mL IVPB     200 mg 580 mL/hr over 30 Minutes Intravenous Once  04/01/20 1603 04/01/20 1716       Time Spent in minutes  30   Lala Lund M.D on 04/03/2020 at 10:38 AM  To page go to [email or web address] - password [facility]  [facility] -  Office  [phone]   See all Orders from today for further details    Objective:   Vitals:   04/02/20 2153 04/03/20 0536 04/03/20 0538 04/03/20 0825  BP:  (!) 129/59  130/66  Pulse:  64  81  Resp:  13  20  Temp: 97.7 F (36.5 C) 98 F (36.7 C) 97.7 F (36.5 C) 98 F (36.7 C)  TempSrc: Oral Oral Oral Oral  SpO2:  91%  91%  Weight:      Height:        Wt Readings from Last 3 Encounters:  04/02/20 70 kg  09/18/19 71.6 kg  09/10/18 73.3 kg     Intake/Output Summary (Last 24 hours) at 04/03/2020 1038 Last data filed at 04/03/2020 0939 Gross per 24 hour  Intake 770.84 ml  Output 1600 ml  Net -829.16 ml     Physical Exam  Awake Alert, No new F.N deficits, Normal affect [place].AT,PERRAL Supple Neck,No JVD, No cervical lymphadenopathy appriciated.  Symmetrical Chest wall movement, Good air movement bilaterally, few fine rales RRR,No Gallops, Rubs or new Murmurs, No Parasternal Heave +ve B.Sounds, Abd Soft, No tenderness, No organomegaly appriciated, No rebound - guarding or rigidity. No Cyanosis, Clubbing or edema, No new Rash or bruise    Data Review:    CBC Recent Labs  Lab 04/01/20 1459 04/01/20 2335 04/02/20 1104 04/03/20 0707  WBC 7.1 6.9 6.9 13.8*  HGB 12.4 12.7 12.6 12.0  HCT 39.3 38.7 38.1 36.3  PLT 206 206 225 284  MCV 88.5 85.8 85.0 84.6  MCH 27.9 28.2 28.1 28.0  MCHC 31.6 32.8 33.1 33.1  RDW 15.5 15.5 15.6* 15.5  LYMPHSABS 1.0  --   --   --   MONOABS 0.6  --   --   --   EOSABS 0.0  --   --   --   BASOSABS 0.0  --   --   --     Chemistries  Recent Labs  Lab 03/31/20 0000 04/01/20 1459 04/01/20 2335 04/02/20 1104 04/03/20 0707  NA  --  130*  --  129* 135  K  --  5.0  --  4.3 3.9  CL  --  99  --  99 102  CO2  --  22  --  20* 23  GLUCOSE  --  102*  --   267* 155*  BUN  --  17  --  21 25*  CREATININE  --  0.95 0.95 0.84 0.86  CALCIUM  --  7.5*  --  7.8* 8.4*  AST  --  61*  --   --  47*  ALT  --  19  --   --  26  ALKPHOS  --  65  --   --  61  BILITOT  --  1.1  --   --  0.6  MG  --   --   --  2.0 2.0  INR 3.8* 2.5*  --  2.5* 3.6*     ------------------------------------------------------------------------------------------------------------------ Recent Labs    04/01/20 1431  TRIG 105    No results found for: HGBA1C ------------------------------------------------------------------------------------------------------------------ No results for input(s): TSH, T4TOTAL, T3FREE, THYROIDAB in the last 72 hours.  Invalid input(s): FREET3  Cardiac Enzymes No results for input(s): CKMB, TROPONINI, MYOGLOBIN in the last 168 hours.  Invalid input(s): CK ------------------------------------------------------------------------------------------------------------------    Component Value Date/Time   BNP 910.7 (H) 04/03/2020 4431    Micro Results Recent Results (from the past 240 hour(s))  Blood Culture (routine x 2)     Status: None (Preliminary result)   Collection Time: 04/01/20  2:31 PM   Specimen: BLOOD RIGHT HAND  Result Value Ref Range Status   Specimen Description BLOOD RIGHT HAND  Final   Special Requests   Final    BOTTLES DRAWN AEROBIC AND ANAEROBIC Blood Culture results may not be optimal due to an inadequate volume of blood received in culture bottles   Culture   Final    NO GROWTH 2 DAYS Performed at [facility], [place] [address], [place], [place] [address]    Report Status PENDING  Incomplete  SARS Coronavirus 2 by RT PCR (hospital order, performed in [facility] hospital lab) Nasopharyngeal Nasopharyngeal Swab     Status: Abnormal   Collection Time: 04/01/20  4:54 PM   Specimen: Nasopharyngeal Swab  Result Value Ref Range Status   SARS Coronavirus 2 POSITIVE (A) NEGATIVE Final    Comment: RESULT CALLED TO,  READ BACK BY AND VERIFIED WITH: E FLUECKIGER RN 04/01/20 1945 JDW (NOTE) SARS-CoV-2 target nucleic acids are DETECTED SARS-CoV-2 RNA is generally detectable in upper respiratory specimens  during the acute phase of infection.  Positive results are indicative  of the presence of the identified virus, but do not rule out bacterial infection or co-infection with other pathogens not detected by the test.  Clinical correlation with patient history and  other diagnostic information is necessary to determine patient infection status.  The expected result is negative. Fact Sheet for Patients:   StrictlyIdeas.no  Fact Sheet for Healthcare Providers:   BankingDealers.co.za   This test is not yet approved or cleared by the [place] FDA and  has been authorized for detection and/or diagnosis of SARS-CoV-2 by FDA under an Emergency Use Authorization (EUA).  This EUA will remain in effect (meaning this test can be  used) for the duration of  the COVID-19 declaration under Section 564(b)(1) of the Act, 21 U.S.C. section 360-bbb-3(b)(1), unless the authorization is terminated or revoked sooner. Performed at [facility], [place] [address], [place], [place] [address]     Radiology Reports DG Chest [place] 1 View  Result Date: 04/01/2020 CLINICAL DATA:  Shortness of breath, hypoxia, COVID-19 positive EXAM: PORTABLE CHEST 1 VIEW COMPARISON:  04/09/2013 FINDINGS: Single frontal view of the chest demonstrates a stable cardiac silhouette, with extensive calcification of the mitral annulus. Postsurgical changes from median sternotomy. Moderate atherosclerosis within the thoracic aorta. There is interstitial prominence with superimposed  ground-glass airspace disease bilaterally, with relative sparing of the left upper lung zone. No effusion or pneumothorax. No acute bony abnormality. IMPRESSION: 1. Bilateral multifocal ground-glass airspace disease sparing the  left upper lung zone, consistent with multifocal pneumonia. Appearance and distribution certainly compatible with given history of COVID-19. Electronically Signed   By: Randa Ngo M.D.   On: 04/01/2020 15:36

## 2020-04-03 NOTE — Plan of Care (Signed)

## 2020-04-04 LAB — COMPREHENSIVE METABOLIC PANEL
ALT: 28 U/L (ref 0–44)
AST: 46 U/L — ABNORMAL HIGH (ref 15–41)
Albumin: 2.6 g/dL — ABNORMAL LOW (ref 3.5–5.0)
Alkaline Phosphatase: 80 U/L (ref 38–126)
Anion gap: 9 (ref 5–15)
BUN: 25 mg/dL — ABNORMAL HIGH (ref 8–23)
CO2: 25 mmol/L (ref 22–32)
Calcium: 8.7 mg/dL — ABNORMAL LOW (ref 8.9–10.3)
Chloride: 100 mmol/L (ref 98–111)
Creatinine, Ser: 0.85 mg/dL (ref 0.44–1.00)
GFR calc Af Amer: >60 mL/min (ref >60)
GFR calc non Af Amer: >60 mL/min (ref >60)
Glucose, Bld: 131 mg/dL — ABNORMAL HIGH (ref 70–99)
Potassium: 5.6 mmol/L — ABNORMAL HIGH (ref 3.5–5.1)
Sodium: 134 mmol/L — ABNORMAL LOW (ref 135–145)
Total Bilirubin: 0.6 mg/dL (ref 0.3–1.2)
Total Protein: 5.7 g/dL — ABNORMAL LOW (ref 6.5–8.1)

## 2020-04-04 LAB — CBC
HCT: 36.1 % (ref 36.0–46.0)
Hemoglobin: 11.9 g/dL — ABNORMAL LOW (ref 12.0–15.0)
MCH: 28.1 pg (ref 26.0–34.0)
MCHC: 33 g/dL (ref 30.0–36.0)
MCV: 85.1 fL (ref 80.0–100.0)
Platelets: 361 10*3/uL (ref 150–400)
RBC: 4.24 MIL/uL (ref 3.87–5.11)
RDW: 15.5 % (ref 11.5–15.5)
WBC: 19 10*3/uL — ABNORMAL HIGH (ref 4.0–10.5)
nRBC: 0 % (ref 0.0–0.2)

## 2020-04-04 LAB — D-DIMER, QUANTITATIVE: D-Dimer, Quant: 0.5 ug/mL-FEU (ref 0.00–0.50)

## 2020-04-04 LAB — BRAIN NATRIURETIC PEPTIDE: B Natriuretic Peptide: 997.4 pg/mL — ABNORMAL HIGH (ref 0.0–100.0)

## 2020-04-04 LAB — PROTIME-INR
INR: 3.3 — ABNORMAL HIGH (ref 0.8–1.2)
Prothrombin Time: 32.9 seconds — ABNORMAL HIGH (ref 11.4–15.2)

## 2020-04-04 LAB — MAGNESIUM: Magnesium: 2.2 mg/dL (ref 1.7–2.4)

## 2020-04-04 LAB — C-REACTIVE PROTEIN: CRP: 2.9 mg/dL — ABNORMAL HIGH (ref <1.0)

## 2020-04-04 MED ORDER — SODIUM POLYSTYRENE SULFONATE 15 GM/60ML PO SUSP
30.0000 g | Freq: Once | ORAL | Status: AC
  Administered 2020-04-04: 30 g via ORAL
  Filled 2020-04-04: qty 120

## 2020-04-04 MED ORDER — MAGNESIUM HYDROXIDE 400 MG/5ML PO SUSP
30.0000 mL | Freq: Two times a day (BID) | ORAL | Status: AC
  Administered 2020-04-04 (×2): 30 mL via ORAL
  Filled 2020-04-04 (×2): qty 30

## 2020-04-04 NOTE — Progress Notes (Signed)
ANTICOAGULATION CONSULT NOTE - Follow Up Consult  Pharmacy Consult for Warfarin Indication: mechanical mitral valve  Allergies  Allergen Reactions  . Codeine     Halluciations  . Sudafed [Pseudoephedrine Hcl]     Halluications    Patient Measurements: Height: 5\' 1"  (154.9 cm) Weight: 70 kg (154 lb 5.2 oz) IBW/kg (Calculated) : 47.8  Vital Signs: Temp: 98.2 F (36.8 C) (05/30 0800) Temp Source: Oral (05/30 0800) BP: 119/56 (05/30 0800) Pulse Rate: 61 (05/30 0800)  Labs: Recent Labs    04/02/20 1104 04/02/20 1104 04/03/20 0707 04/04/20 0842  HGB 12.6   < > 12.0 11.9*  HCT 38.1  --  36.3 36.1  PLT 225  --  284 361  LABPROT 26.1*  --  34.7* 32.9*  INR 2.5*  --  3.6* 3.3*  CREATININE 0.84  --  0.86 0.85   < > = values in this interval not displayed.    Estimated Creatinine Clearance: 44.1 mL/min (by C-G formula based on SCr of 0.85 mg/dL).  Assessment:  86 year old female presenting to ED 04/01/20 with dyspnea found to be COVID+. Patient takes warfarin prior to admission for mechanical valve. Pharmacy consulted to continue warfarin.  Home warfarin regimen: 5 mg daily except 2.5 mg on Mondays and Wednesdays.    INR supratherapeutic (3.8) on 5/26 and Warfarin held. INR down to 2.5 on 5/27 and usual 5 mg dose given.  INR 2.5 again 5/28. Patient was supratherapeutic at 3.6 on 5/29 and warfarin was held and INR 3.3 this AM.  No bleeding reported.  Goal of Therapy:  INR 2.0-2.5 due to history of GI bleed Monitor platelets by anticoagulation protocol: Yes   Plan:  Hold warfarin dose tonight  Daily PT/INR. Monitor for signs and symptoms of bleeding.  6/29, RPh Phone: [phone] 04/04/2020,11:08 AM

## 2020-04-04 NOTE — Progress Notes (Addendum)
PROGRESS NOTE                                                                                                                                                                                                             Patient Demographics:    Tracy Vargas, is a 86 y.o. female, DOB - [birth year], JIR:[id number]  Outpatient Primary MD for the patient is Carol Ada, MD    LOS - 3  Admit date - 04/01/2020    Chief Complaint  Patient presents with  . Shortness of Breath  . Fatigue       Brief Narrative -  Tracy Vargas is a 86 y.o. female with medical history significant of diastolic heart failure, mechanical aortic valve replacement, hypertension, and dyslipidemia.  Patient reports not feeling well for the last 2 weeks.  Her symptoms consisted initially with generalized weakness, malaise and poor appetite.  Then she developed nausea, vomiting, diarrhea and poor oral intake.  Over the last 24 hours she has developed dyspnea which is moderate to severe in intensity, no associated cough, worse with exertion, no improving factors, she was diagnosed with Acute Hypoxic Resp failure due to Acute Covid 19 Pneumonia, dehydration and admitted.   Subjective:   Patient in bed, appears comfortable, denies any headache, no fever, no chest pain or pressure, improving shortness of breath , no abdominal pain. No focal weakness.   Assessment  & Plan :    1. Acute Hypoxic Resp. Failure due to Acute Covid 19 Viral Pneumonitis with gastroenteritis - she has moderate disease, gastroenteritis seems to have resolved, she has been treated with IV steroids, remdesivir and IV fluids.  Breathing slightly improved we will continue to monitor closely.  Encouraged the patient to sit up in chair in the daytime use I-S and flutter valve for pulmonary toiletry and then prone in bed when at night.  Will advance activity and titrate down oxygen as  possible.  SpO2: 91 % O2 Flow Rate (L/min): 3 L/min  Recent Labs  Lab 04/01/20 1459 04/01/20 1654 04/02/20 1104 04/03/20 0707 04/03/20 0713 04/04/20 0842  CRP 7.0*  --  8.5* 6.1*  --  2.9*  DDIMER 0.73*  --   --  1.19*  --  0.50  BNP  --   --  605.7*  --  910.7* 997.4*  PROCALCITON <0.10  --   --   --   --   --  SARSCOV2NAA  --  POSITIVE*  --   --   --   --     Hepatic Function Latest Ref Rng & Units 04/04/2020 04/03/2020 04/01/2020  Total Protein 6.5 - 8.1 g/dL 5.7(L) 5.3(L) 5.3(L)  Albumin 3.5 - 5.0 g/dL 2.6(L) 2.5(L) 2.7(L)  AST 15 - 41 U/L 46(H) 47(H) 61(H)  ALT 0 - 44 U/L '28 26 19  ' Alk Phosphatase 38 - 126 U/L 80 61 65  Total Bilirubin 0.3 - 1.2 mg/dL 0.6 0.6 1.1    2.  Hypertension.  BP hold amlodipine, lisinopril and furosemide. IVF for now.  3. Chr. Diastolic heart failure EF 60% . Currently dehydrated, hypotensive with Hyponatremia - Gentle IVF and monitor.  4.  Status post mechanical aortic valve replacement.  Continue warfarin, per pharmacy protocol.  5.  Dyslipidemia.  Continue atorvastatin.  6. Constipation.  Placed on bowel regimen will monitor closely.  7.  Hyperkalemia.  Dose of Kayexalate on 04/04/2020 and monitor.   Condition - Extremely Guarded  Family Communication  : Husband Deidre Ala [phone] on 04/02/2020 and son in law Louie Casa [phone] on 04/03/20   Code Status :  Full  Consults  :  None  Procedures  :  None  PUD Prophylaxis : None  Disposition Plan  :    Status is: Inpatient  Remains inpatient appropriate because:IV treatments appropriate due to intensity of illness or inability to take PO  Dispo: The patient is from: Home              Anticipated d/c is to: Home              Anticipated d/c date is: > 3 days              Patient currently is not medically stable to d/c.  Getting treatment for acute hypoxic respiratory failure caused by COVID-19 pneumonia with IV remdesivir and oxygen.   DVT Prophylaxis  :  Coumadin  Lab  Results  Component Value Date   INR 3.3 (H) 04/04/2020   INR 3.6 (H) 04/03/2020   INR 2.5 (H) 04/02/2020    Lab Results  Component Value Date   PLT 361 04/04/2020    Diet :   Diet Order            Diet Heart Room service appropriate? Yes; Fluid consistency: Thin  Diet effective now               Inpatient Medications  Scheduled Meds: . vitamin C  1,000 mg Oral Daily  . atorvastatin  10 mg Oral q1800  . B-complex with vitamin C  1 tablet Oral Daily  . bisacodyl  10 mg Rectal Daily  . calcium-vitamin D  1 tablet Oral BID  . chlorpheniramine-HYDROcodone  5 mL Oral Q12H  . cholecalciferol  2,000 Units Oral Daily  . dexamethasone (DECADRON) injection  6 mg Intravenous Q24H  . Ipratropium-Albuterol  1 puff Inhalation Q6H  . magnesium oxide  800 mg Oral Daily  . multivitamin with minerals  1 tablet Oral Daily  . polyethylene glycol  17 g Oral BID  . Warfarin - Pharmacist Dosing Inpatient   Does not apply q1600  . zinc sulfate  220 mg Oral Daily   Continuous Infusions: . remdesivir 100 mg in NS 100 mL 100 mg (04/04/20 0907)   PRN Meds:.acetaminophen **OR** [DISCONTINUED] acetaminophen, albuterol, alum & mag hydroxide-simeth, guaiFENesin-dextromethorphan, [DISCONTINUED] ondansetron **OR** ondansetron (ZOFRAN) IV  Antibiotics  :    Anti-infectives (From admission, onward)  Start     Dose/Rate Route Frequency Ordered Stop   04/02/20 1000  remdesivir 100 mg in sodium chloride 0.9 % 100 mL IVPB  Status:  Discontinued     100 mg 200 mL/hr over 30 Minutes Intravenous Daily 04/01/20 2256 04/01/20 2316   04/02/20 1000  remdesivir 100 mg in sodium chloride 0.9 % 100 mL IVPB     100 mg 200 mL/hr over 30 Minutes Intravenous Daily 04/01/20 1603 04/06/20 0959   04/01/20 2256  remdesivir 200 mg in sodium chloride 0.9% 250 mL IVPB  Status:  Discontinued     200 mg 580 mL/hr over 30 Minutes Intravenous Once 04/01/20 2256 04/01/20 2316   04/01/20 1630  remdesivir 200 mg in sodium  chloride 0.9% 250 mL IVPB     200 mg 580 mL/hr over 30 Minutes Intravenous Once 04/01/20 1603 04/01/20 1716       Time Spent in minutes  30   Lala Lund M.D on 04/04/2020 at 10:29 AM  To page go to [email or web address] - password [email or web address]  [facility] -  Office  [phone]   See all Orders from today for further details    Objective:   Vitals:   04/03/20 2047 04/04/20 0042 04/04/20 0405 04/04/20 0800  BP: 133/65 119/66 (!) 117/53 (!) 119/56  Pulse: 68 67 (!) 59 61  Resp: '16 18 18 16  ' Temp: 98 F (36.7 C) 97.8 F (36.6 C) 97.8 F (36.6 C) 98.2 F (36.8 C)  TempSrc: Oral Oral Oral Oral  SpO2: 92% 94% 95% 91%  Weight:      Height:        Wt Readings from Last 3 Encounters:  04/02/20 70 kg  09/18/19 71.6 kg  09/10/18 73.3 kg     Intake/Output Summary (Last 24 hours) at 04/04/2020 1029 Last data filed at 04/04/2020 0246 Gross per 24 hour  Intake 200 ml  Output 200 ml  Net 0 ml     Physical Exam  Awake Alert, No new F.N deficits, Normal affect [place].AT,PERRAL Supple Neck,No JVD, No cervical lymphadenopathy appriciated.  Symmetrical Chest wall movement, Good air movement bilaterally, few rales RRR,No Gallops, Rubs or new Murmurs, No Parasternal Heave +ve B.Sounds, Abd Soft, No tenderness, No organomegaly appriciated, No rebound - guarding or rigidity. No Cyanosis, Clubbing or edema, No new Rash or bruise    Data Review:    CBC Recent Labs  Lab 04/01/20 1459 04/01/20 2335 04/02/20 1104 04/03/20 0707 04/04/20 0842  WBC 7.1 6.9 6.9 13.8* 19.0*  HGB 12.4 12.7 12.6 12.0 11.9*  HCT 39.3 38.7 38.1 36.3 36.1  PLT 206 206 225 284 361  MCV 88.5 85.8 85.0 84.6 85.1  MCH 27.9 28.2 28.1 28.0 28.1  MCHC 31.6 32.8 33.1 33.1 33.0  RDW 15.5 15.5 15.6* 15.5 15.5  LYMPHSABS 1.0  --   --   --   --   MONOABS 0.6  --   --   --   --   EOSABS 0.0  --   --   --   --   BASOSABS 0.0  --   --   --   --     Chemistries  Recent Labs  Lab 03/31/20 0000 04/01/20 1459  04/01/20 2335 04/02/20 1104 04/03/20 0707 04/04/20 0842  NA  --  130*  --  129* 135 134*  K  --  5.0  --  4.3 3.9 5.6*  CL  --  99  --  99 102 100  CO2  --  22  --  20* 23 25  GLUCOSE  --  102*  --  267* 155* 131*  BUN  --  17  --  21 25* 25*  CREATININE  --  0.95 0.95 0.84 0.86 0.85  CALCIUM  --  7.5*  --  7.8* 8.4* 8.7*  AST  --  61*  --   --  47* 46*  ALT  --  19  --   --  26 28  ALKPHOS  --  65  --   --  61 80  BILITOT  --  1.1  --   --  0.6 0.6  MG  --   --   --  2.0 2.0 2.2  INR 3.8* 2.5*  --  2.5* 3.6* 3.3*     ------------------------------------------------------------------------------------------------------------------ Recent Labs    04/01/20 1431  TRIG 105    No results found for: HGBA1C ------------------------------------------------------------------------------------------------------------------ No results for input(s): TSH, T4TOTAL, T3FREE, THYROIDAB in the last 72 hours.  Invalid input(s): FREET3  Cardiac Enzymes No results for input(s): CKMB, TROPONINI, MYOGLOBIN in the last 168 hours.  Invalid input(s): CK ------------------------------------------------------------------------------------------------------------------    Component Value Date/Time   BNP 997.4 (H) 04/04/2020 7001    Micro Results Recent Results (from the past 240 hour(s))  Blood Culture (routine x 2)     Status: None (Preliminary result)   Collection Time: 04/01/20  2:31 PM   Specimen: BLOOD RIGHT HAND  Result Value Ref Range Status   Specimen Description BLOOD RIGHT HAND  Final   Special Requests   Final    BOTTLES DRAWN AEROBIC AND ANAEROBIC Blood Culture results may not be optimal due to an inadequate volume of blood received in culture bottles   Culture   Final    NO GROWTH 3 DAYS Performed at [facility], [place] [address], [place], [place] [address]    Report Status PENDING  Incomplete  SARS Coronavirus 2 by RT PCR (hospital order, performed in [facility]  hospital lab) Nasopharyngeal Nasopharyngeal Swab     Status: Abnormal   Collection Time: 04/01/20  4:54 PM   Specimen: Nasopharyngeal Swab  Result Value Ref Range Status   SARS Coronavirus 2 POSITIVE (A) NEGATIVE Final    Comment: RESULT CALLED TO, READ BACK BY AND VERIFIED WITH: E FLUECKIGER RN 04/01/20 1945 JDW (NOTE) SARS-CoV-2 target nucleic acids are DETECTED SARS-CoV-2 RNA is generally detectable in upper respiratory specimens  during the acute phase of infection.  Positive results are indicative  of the presence of the identified virus, but do not rule out bacterial infection or co-infection with other pathogens not detected by the test.  Clinical correlation with patient history and  other diagnostic information is necessary to determine patient infection status.  The expected result is negative. Fact Sheet for Patients:   StrictlyIdeas.no  Fact Sheet for Healthcare Providers:   BankingDealers.co.za   This test is not yet approved or cleared by the [place] FDA and  has been authorized for detection and/or diagnosis of SARS-CoV-2 by FDA under an Emergency Use Authorization (EUA).  This EUA will remain in effect (meaning this test can be  used) for the duration of  the COVID-19 declaration under Section 564(b)(1) of the Act, 21 U.S.C. section 360-bbb-3(b)(1), unless the authorization is terminated or revoked sooner. Performed at [facility], [place] [address], [place], [place] [address]     Radiology Reports DG Chest [place] 1 View  Result Date: 04/01/2020 CLINICAL DATA:  Shortness  of breath, hypoxia, COVID-19 positive EXAM: PORTABLE CHEST 1 VIEW COMPARISON:  04/09/2013 FINDINGS: Single frontal view of the chest demonstrates a stable cardiac silhouette, with extensive calcification of the mitral annulus. Postsurgical changes from median sternotomy. Moderate atherosclerosis within the thoracic aorta. There is interstitial  prominence with superimposed ground-glass airspace disease bilaterally, with relative sparing of the left upper lung zone. No effusion or pneumothorax. No acute bony abnormality. IMPRESSION: 1. Bilateral multifocal ground-glass airspace disease sparing the left upper lung zone, consistent with multifocal pneumonia. Appearance and distribution certainly compatible with given history of COVID-19. Electronically Signed   By: Randa Ngo M.D.   On: 04/01/2020 15:36

## 2020-04-05 LAB — CBC
HCT: 32.4 % — ABNORMAL LOW (ref 36.0–46.0)
Hemoglobin: 10.6 g/dL — ABNORMAL LOW (ref 12.0–15.0)
MCH: 28 pg (ref 26.0–34.0)
MCHC: 32.7 g/dL (ref 30.0–36.0)
MCV: 85.7 fL (ref 80.0–100.0)
Platelets: 326 10*3/uL (ref 150–400)
RBC: 3.78 MIL/uL — ABNORMAL LOW (ref 3.87–5.11)
RDW: 15.3 % (ref 11.5–15.5)
WBC: 15.5 10*3/uL — ABNORMAL HIGH (ref 4.0–10.5)
nRBC: 0 % (ref 0.0–0.2)

## 2020-04-05 LAB — COMPREHENSIVE METABOLIC PANEL
ALT: 26 U/L (ref 0–44)
AST: 38 U/L (ref 15–41)
Albumin: 2.3 g/dL — ABNORMAL LOW (ref 3.5–5.0)
Alkaline Phosphatase: 59 U/L (ref 38–126)
Anion gap: 6 (ref 5–15)
BUN: 27 mg/dL — ABNORMAL HIGH (ref 8–23)
CO2: 29 mmol/L (ref 22–32)
Calcium: 8.4 mg/dL — ABNORMAL LOW (ref 8.9–10.3)
Chloride: 101 mmol/L (ref 98–111)
Creatinine, Ser: 0.91 mg/dL (ref 0.44–1.00)
GFR calc Af Amer: >60 mL/min (ref >60)
GFR calc non Af Amer: 58 mL/min — ABNORMAL LOW (ref >60)
Glucose, Bld: 148 mg/dL — ABNORMAL HIGH (ref 70–99)
Potassium: 4.7 mmol/L (ref 3.5–5.1)
Sodium: 136 mmol/L (ref 135–145)
Total Bilirubin: 0.8 mg/dL (ref 0.3–1.2)
Total Protein: 4.8 g/dL — ABNORMAL LOW (ref 6.5–8.1)

## 2020-04-05 LAB — PROTIME-INR
INR: 3.5 — ABNORMAL HIGH (ref 0.8–1.2)
Prothrombin Time: 33.9 seconds — ABNORMAL HIGH (ref 11.4–15.2)

## 2020-04-05 LAB — MAGNESIUM: Magnesium: 2.2 mg/dL (ref 1.7–2.4)

## 2020-04-05 LAB — C-REACTIVE PROTEIN: CRP: 1.7 mg/dL — ABNORMAL HIGH (ref <1.0)

## 2020-04-05 LAB — D-DIMER, QUANTITATIVE: D-Dimer, Quant: 0.33 ug/mL-FEU (ref 0.00–0.50)

## 2020-04-05 LAB — BRAIN NATRIURETIC PEPTIDE: B Natriuretic Peptide: 916.7 pg/mL — ABNORMAL HIGH (ref 0.0–100.0)

## 2020-04-05 MED ORDER — ALBUTEROL SULFATE HFA 108 (90 BASE) MCG/ACT IN AERS: 2.0000 | INHALATION_SPRAY | Freq: Four times a day (QID) | RESPIRATORY_TRACT | 0 refills | Status: DC | PRN

## 2020-04-05 MED ORDER — FUROSEMIDE 40 MG PO TABS: 40.0000 mg | ORAL_TABLET | Freq: Once | ORAL | Status: DC

## 2020-04-05 NOTE — Progress Notes (Signed)
SATURATION QUALIFICATIONS: (This note is used to comply with regulatory documentation for home oxygen)  Patient Saturations on Room Air at Rest = 93%  Patient Saturations on Room Air while Ambulating = 86%  Patient Saturations on 2 Liters of oxygen while Ambulating = 96%  Please briefly explain why patient needs home oxygen: 

## 2020-04-05 NOTE — Discharge Summary (Signed)
Tracy Vargas OZD:[id number] DOB: 09-25-1934 DOA: 04/01/2020  PCP: Carol Ada, MD  Admit date: 04/01/2020  Discharge date: 04/05/2020  Admitted From: Home   Disposition:  Home   Recommendations for Outpatient Follow-up:   Follow up with PCP in 1-2 weeks  PCP Please obtain BMP/CBC, 2 view CXR in 1week,  (see Discharge instructions)   PCP Please follow up on the following pending results:    Home Health: PT Equipment/Devices: 2lit [place] o2  Consultations: None  Discharge Condition: Stable    CODE STATUS: Full    Diet Recommendation: Heart Healthy 1.5 L fluid restriction per day.   Chief Complaint  Patient presents with  . Shortness of Breath  . Fatigue     Brief history of present illness from the day of admission and additional interim summary    Tracy Vargas a 86 y.o.femalewith medical history significant ofdiastolic heart failure,mechanical aortic valve replacement,hypertension, and dyslipidemia. Patient reports not feeling well for the last 2 weeks. Her symptoms consisted initially with generalized weakness, malaise and poor appetite. Then she developed nausea, vomiting, diarrhea and poor oral intake. Over the last 24 hours she has developed dyspnea which is moderate to severe in intensity, no associated cough, worse with exertion, no improving factors, she was diagnosed with Acute Hypoxic Resp failure due to Acute Covid 19 Pneumonia, dehydration and admitted.                                                                 Hospital Course    1. Acute Hypoxic Resp. Failure due to Acute Covid 19 Viral Pneumonitis with gastroenteritis - she had moderate pulmonary disease along with gastroenteritis, she was treated with IV steroids and remdesivir, her gastroenteritis is completely resolved  and her shortness of breath and pulmonary symptoms are much improved as well, she symptom-free on 2 L nasal cannula oxygen, she has finished her COVID-19 specific treatment and will be discharged home on 2 L nasal cannula oxygen along with rescue inhaler and outpatient follow-up with PCP in a week post discharge.   Recent Labs  Lab 04/01/20 1459 04/01/20 1654 04/02/20 1104 04/03/20 0707 04/03/20 0713 04/04/20 0842 04/05/20 0500  CRP 7.0*  --  8.5* 6.1*  --  2.9* 1.7*  DDIMER 0.73*  --   --  1.19*  --  0.50 0.33  FERRITIN 452*  --   --   --   --   --   --   BNP  --   --  605.7*  --  910.7* 997.4* 916.7*  PROCALCITON <0.10  --   --   --   --   --   --   SARSCOV2NAA  --  POSITIVE*  --   --   --   --   --  Hepatic Function Latest Ref Rng & Units 04/05/2020 04/04/2020 04/03/2020  Total Protein 6.5 - 8.1 g/dL 4.8(L) 5.7(L) 5.3(L)  Albumin 3.5 - 5.0 g/dL 2.3(L) 2.6(L) 2.5(L)  AST 15 - 41 U/L 38 46(H) 47(H)  ALT 0 - 44 U/L _0 Alk Phosphatase 38 - 126 U/L 59 80 61  Total Bilirubin 0.3 - 1.2 mg/dL 0.8 0.6 0.6    2.Hypertension.BP stable resume home regimen.  Follow-up with PCP in a week.  3.Chr.Diastolic heart failure EF 60% .  Was dehydrated upon admission has been hydrated with IV fluids now compensated and back to baseline resume home dose diuretic and blood pressure medications upon discharge.  4.Status post mechanical aortic valve replacement.Was on Coumadin, INR currently 3.5, was monitored by pharmacy.  Will follow with PCP in a week for repeat INR check.  5.Dyslipidemia.Continue atorvastatin.  6. Constipation.    Placed on bowel regimen with good effect.     Discharge diagnosis     Principal Problem:   Pneumonia due to COVID-19 virus Active Problems:   HTN (hypertension)   Hyperlipidemia   Chronic diastolic heart failure (HCC)   H/O mechanical aortic valve replacement   Acute respiratory failure with hypoxia [facility])    Discharge  instructions    Discharge Instructions    Discharge instructions   Complete by: As directed    Follow with Primary MD Carol Ada, MD in 7 days   Get CBC, CMP, INR,  2 view Chest X ray -  checked next visit within 1 week by Primary MD    Activity: As tolerated with Full fall precautions use walker/cane & assistance as needed  Disposition Home   Diet: Heart Healthy with 1.5 L fluid restriction per day  Special Instructions: If you have smoked or chewed Tobacco  in the last 2 yrs please stop smoking, stop any regular Alcohol  and or any Recreational drug use.  On your next visit with your primary care physician please Get Medicines reviewed and adjusted.  Please request your Prim.MD to go over all Hospital Tests and Procedure/Radiological results at the follow up, please get all Hospital records sent to your Prim MD by signing hospital release before you go home.  If you experience worsening of your admission symptoms, develop shortness of breath, life threatening emergency, suicidal or homicidal thoughts you must seek medical attention immediately by calling 911 or calling your MD immediately  if symptoms less severe.  You Must read complete instructions/literature along with all the possible adverse reactions/side effects for all the Medicines you take and that have been prescribed to you. Take any new Medicines after you have completely understood and accpet all the possible adverse reactions/side effects.   Increase activity slowly   Complete by: As directed    MyChart COVID-19 home monitoring program   Complete by: Apr 05, 2020    Is the patient willing to use the [facility] for home monitoring?: Yes   Temperature monitoring   Complete by: Apr 05, 2020    After how many days would you like to receive a notification of this patient's flowsheet entries?: 1      Discharge Medications   Allergies as of 04/05/2020      Reactions   Codeine    Halluciations   Sudafed  [pseudoephedrine Hcl]    Halluications      Medication List    TAKE these medications   albuterol 108 (90 Base) MCG/ACT inhaler Commonly known as: VENTOLIN HFA  Inhale 2 puffs into the lungs every 6 (six) hours as needed for wheezing or shortness of breath.   alendronate 70 MG tablet Commonly known as: FOSAMAX Take 70 mg by mouth once a week.   amLODipine 2.5 MG tablet Commonly known as: NORVASC Take 2.5 mg by mouth daily.   amoxicillin 500 MG capsule Commonly known as: AMOXIL Take 4 capsules by mouth as needed (4 capsules prior to dental appointment).   atorvastatin 10 MG tablet Commonly known as: LIPITOR Take 10 mg by mouth daily at 6 PM.   b complex vitamins capsule Take 1 capsule by mouth daily.   Biotin 5000 MCG Tabs Take 1 tablet by mouth 3 (three) times a week.   Calcium Citrate + D 315-200 MG-UNIT tablet Generic drug: calcium citrate-vitamin D Take 2 tablets by mouth 2 (two) times daily.   [employer] Triple Magnesium Complex 400 MG Caps Generic drug: Magnesium Take 2 capsules by mouth daily.   docusate sodium 250 MG capsule Commonly known as: COLACE Take 250 mg by mouth daily.   furosemide 20 MG tablet Commonly known as: LASIX Take 20 mg by mouth every 7 (seven) days. Take once a week per patient . No specific days   Glucosamine-MSM-Hyaluronic Acd 750-375-30 MG Tabs Take 2 tablets by mouth 2 (two) times daily.   lisinopril 20 MG tablet Commonly known as: ZESTRIL Take 20 mg by mouth daily.   Omega-3 180-270 MG Caps Take 1 capsule by mouth daily.   pantoprazole 40 MG tablet Commonly known as: PROTONIX Take 40 mg by mouth as needed (heartburn).   vitamin C 1000 MG tablet Take 1,000 mg by mouth daily.   Vitamin D 50 MCG (2000 UT) Caps Take 1 capsule by mouth daily.   warfarin 5 MG tablet Commonly known as: Jantoven Take as directed. If you are unsure how to take this medication, talk to your nurse or doctor. Original instructions: TAKE AS DIRECTED  BY        [facility] What changed:   how much to take  how to take this  when to take this  additional instructions   zinc gluconate 50 MG tablet Take 50 mg by mouth daily.            Durable Medical Equipment  (From admission, onward)         Start     Ordered   04/05/20 0729  For home use only DME oxygen  Once    Question Answer Comment  Length of Need 6 Months   Mode or (Route) Nasal cannula   Liters per Minute 2   Frequency Continuous (stationary and portable oxygen unit needed)   Oxygen delivery system Gas      04/05/20 0728          Follow-up Information    Carol Ada, MD. Schedule an appointment as soon as possible for a visit in 1 week(s).   Specialty: Family Medicine Contact information: [address] [place] [address] [phone]        Jerline Pain, MD. Schedule an appointment as soon as possible for a visit in 1 week(s).   Specialty: Cardiology Contact information: [address] [place] [address] [place] [address] [phone]           Major procedures and Radiology Reports - PLEASE review detailed and final reports thoroughly  -         DG Chest [facility] 1 View  Result Date: 04/01/2020 CLINICAL  DATA:  Shortness of breath, hypoxia, COVID-19 positive EXAM: PORTABLE CHEST 1 VIEW COMPARISON:  04/09/2013 FINDINGS: Single frontal view of the chest demonstrates a stable cardiac silhouette, with extensive calcification of the mitral annulus. Postsurgical changes from median sternotomy. Moderate atherosclerosis within the thoracic aorta. There is interstitial prominence with superimposed ground-glass airspace disease bilaterally, with relative sparing of the left upper lung zone. No effusion or pneumothorax. No acute bony abnormality. IMPRESSION: 1. Bilateral multifocal ground-glass airspace disease sparing the left upper lung zone, consistent with multifocal pneumonia. Appearance and distribution certainly  compatible with given history of COVID-19. Electronically Signed   By: Randa Ngo M.D.   On: 04/01/2020 15:36    Micro Results    Recent Results (from the past 240 hour(s))  Blood Culture (routine x 2)     Status: None (Preliminary result)   Collection Time: 04/01/20  2:31 PM   Specimen: BLOOD RIGHT HAND  Result Value Ref Range Status   Specimen Description BLOOD RIGHT HAND  Final   Special Requests   Final    BOTTLES DRAWN AEROBIC AND ANAEROBIC Blood Culture results may not be optimal due to an inadequate volume of blood received in culture bottles   Culture   Final    NO GROWTH 4 DAYS Performed at [facility], [place] [address], [place], [place] [address]    Report Status PENDING  Incomplete  SARS Coronavirus 2 by RT PCR (hospital order, performed in [facility] hospital lab) Nasopharyngeal Nasopharyngeal Swab     Status: Abnormal   Collection Time: 04/01/20  4:54 PM   Specimen: Nasopharyngeal Swab  Result Value Ref Range Status   SARS Coronavirus 2 POSITIVE (A) NEGATIVE Final    Comment: RESULT CALLED TO, READ BACK BY AND VERIFIED WITH: E FLUECKIGER RN 04/01/20 1945 JDW (NOTE) SARS-CoV-2 target nucleic acids are DETECTED SARS-CoV-2 RNA is generally detectable in upper respiratory specimens  during the acute phase of infection.  Positive results are indicative  of the presence of the identified virus, but do not rule out bacterial infection or co-infection with other pathogens not detected by the test.  Clinical correlation with patient history and  other diagnostic information is necessary to determine patient infection status.  The expected result is negative. Fact Sheet for Patients:   StrictlyIdeas.no  Fact Sheet for Healthcare Providers:   BankingDealers.co.za   This test is not yet approved or cleared by the [place] FDA and  has been authorized for detection and/or diagnosis of SARS-CoV-2 by FDA under an  Emergency Use Authorization (EUA).  This EUA will remain in effect (meaning this test can be  used) for the duration of  the COVID-19 declaration under Section 564(b)(1) of the Act, 21 U.S.C. section 360-bbb-3(b)(1), unless the authorization is terminated or revoked sooner. Performed at [facility], [place] [address], [place], [place] [address]     Today   Subjective    Tracy Vargas today has no headache,no chest abdominal pain,no new weakness tingling or numbness, feels much better wants to go home today.     Objective   Blood pressure 127/64, pulse 62, temperature 97.8 F (36.6 C), temperature source Oral, resp. rate 14, height _0  (1.549 m), weight 70 kg, SpO2 96 %.   Intake/Output Summary (Last 24 hours) at 04/05/2020 1039 Last data filed at 04/05/2020 0152 Gross per 24 hour  Intake 100 ml  Output 400 ml  Net -300 ml    Exam  Awake Alert, No new  F.N deficits, Normal affect [place].AT,PERRAL Supple Neck,No JVD, No cervical lymphadenopathy appriciated.  Symmetrical Chest wall movement, Good air movement bilaterally, CTAB RRR,No Gallops,Rubs or new Murmurs, No Parasternal Heave +ve B.Sounds, Abd Soft, Non tender, No organomegaly appriciated, No rebound -guarding or rigidity. No Cyanosis, Clubbing or edema, No new Rash or bruise   Data Review   CBC w Diff:  Lab Results  Component Value Date   WBC 15.5 (H) 04/05/2020   HGB 10.6 (L) 04/05/2020   HCT 32.4 (L) 04/05/2020   PLT 326 04/05/2020   LYMPHOPCT 13 04/01/2020   MONOPCT 9 04/01/2020   EOSPCT 0 04/01/2020   BASOPCT 0 04/01/2020    CMP:  Lab Results  Component Value Date   NA 136 04/05/2020   K 4.7 04/05/2020   CL 101 04/05/2020   CO2 29 04/05/2020   BUN 27 (H) 04/05/2020   CREATININE 0.91 04/05/2020   PROT 4.8 (L) 04/05/2020   ALBUMIN 2.3 (L) 04/05/2020   BILITOT 0.8 04/05/2020   ALKPHOS 59 04/05/2020   AST 38 04/05/2020   ALT 26 04/05/2020  . Lab Results  Component Value Date   INR 3.5 (H)  04/05/2020   INR 3.3 (H) 04/04/2020   INR 3.6 (H) 04/03/2020     Total Time in preparing paper work, data evaluation and todays exam - 53 minutes  Lala Lund M.D on 04/05/2020 at 10:39 AM  [facility]   Office  [phone]

## 2020-04-05 NOTE — TOC Transition Note (Signed)
Transition of Care [facility]) - CM/SW Discharge Note   Patient Details  Name: Tracy Vargas MRN: [id number] Date of Birth: 1934/07/09  Transition of Care [facility]) CM/SW Contact:  Durenda Guthrie, RN Phone Number: [phone] 04/05/2020, 10:57 AM   Clinical Narrative:   Case manager spoke with patient via telephone to discuss discharge needs. She asked that I contact her son-in-law, Tracy Vargas to arrange [facility] needs, he arranges things for patient and her husband. CM contacted [name], discussed needs for [facility] and oxygen. [facility] selected. Referral called to [name], [facility] will deliver oxygen to patient's home. Transport tank will also be delivered to home and [name] is aware that he needs to bring it to pick up patient .    Final next level of care: Home w Home Health Services Barriers to Discharge: No Barriers Identified   Patient Goals and CMS Choice Patient states their goals for this hospitalization and ongoing recovery are:: get better CMS Medicare.gov Compare Post Acute Care list provided to:: Patient Represenative (must comment)(son-in-law Tracy Vargas  [phone]) Choice offered to / list presented to : Patient, Adult Children  Discharge Placement                       Discharge Plan and Services In-house Referral: NA Discharge Planning Services: CM Consult Post Acute Care Choice: Durable Medical Equipment, Home Health          DME Arranged: Oxygen DME Agency: [facility] Date DME Agency Contacted: 04/05/20 Time DME Agency Contacted: 1054   HH Arranged: PT HH Agency: [facility] Date [facility] Agency Contacted: 04/05/20 Time HH Agency Contacted: 1049 Representative spoke with at [facility] Agency: [name]  Social Determinants of Health (SDOH) Interventions     Readmission Risk Interventions No flowsheet data found.

## 2020-04-05 NOTE — Care Management Important Message (Signed)
Important Message  Patient Details  Name: Tracy Vargas MRN: [id number] Date of Birth: [birth year]   Medicare Important Message Given:  Yes - Important Message mailed due to current National Emergency  Verbal consent obtained due to current National Emergency  Relationship to patient: Self Contact Name: Trinisha Paget Call Date: 04/05/20  Time: 1123 Phone: [phone] Outcome: Spoke with contact Important Message mailed to: Patient address on file    Oralia Rud Imoni Kohen 04/05/2020, 11:24 AM

## 2020-04-05 NOTE — Progress Notes (Signed)
ANTICOAGULATION CONSULT NOTE - Follow Up Consult  Pharmacy Consult for Warfarin Indication: mechanical mitral valve  Allergies  Allergen Reactions  . Codeine     Halluciations  . Sudafed [Pseudoephedrine Hcl]     Halluications    Patient Measurements: Height: 5\' 1"  (154.9 cm) Weight: 70 kg (154 lb 5.2 oz) IBW/kg (Calculated) : 47.8  Vital Signs: Temp: 97.8 F (36.6 C) (05/31 0437) Temp Source: Oral (05/31 0437) BP: 127/64 (05/31 0437) Pulse Rate: 62 (05/31 0437)  Labs: Recent Labs    04/03/20 0707 04/03/20 0707 04/04/20 0842 04/05/20 0500  HGB 12.0   < > 11.9* 10.6*  HCT 36.3  --  36.1 32.4*  PLT 284  --  361 326  LABPROT 34.7*  --  32.9* 33.9*  INR 3.6*  --  3.3* 3.5*  CREATININE 0.86  --  0.85 0.91   < > = values in this interval not displayed.    Estimated Creatinine Clearance: 41.2 mL/min (by C-G formula based on SCr of 0.91 mg/dL).  Assessment:  86 year old female presenting to ED 04/01/20 with dyspnea found to be COVID+. Patient takes warfarin prior to admission for mechanical valve. Pharmacy consulted to continue warfarin.  Home warfarin regimen: 5 mg daily except 2.5 mg on Mondays and Wednesdays.    INR continues to be supratherapeutic at 3.5. No notes of bleeding, H/H stable   Goal of Therapy:  INR 2.0-2.5 due to history of GI bleed Monitor platelets by anticoagulation protocol: Yes   Plan:  Hold warfarin dose tonight  Daily PT/INR. Monitor for signs and symptoms of bleeding.  11-30-1992, RPh Phone: [phone] 04/05/2020,10:18 AM

## 2020-04-05 NOTE — Progress Notes (Signed)
Physical Therapy Treatment Patient Details Name: Tracy Vargas MRN: [id number] DOB: 1933/11/24 Today's Date: 04/05/2020    History of Present Illness Pt adm with Covid 19 PNA. PMH - chf, avr, htn    PT Comments    Pt making steady progress with mobility. Pt to return home with husband and son-in-law.    Follow Up Recommendations  Home health PT;Supervision for mobility/OOB     Equipment Recommendations  None recommended by PT    Recommendations for Other Services       Precautions / Restrictions Precautions Precautions: Fall Restrictions Weight Bearing Restrictions: No    Mobility  Bed Mobility               General bed mobility comments: Pt up in chair  Transfers Overall transfer level: Needs assistance Equipment used: None Transfers: Sit to/from Stand Sit to Stand: Supervision         General transfer comment: Assist for lines/safety  Ambulation/Gait Ambulation/Gait assistance: Min guard Gait Distance (Feet): 180 Feet Assistive device: Rolling walker (2 wheeled);None Gait Pattern/deviations: Step-through pattern;Decreased step length - right;Decreased step length - left;Drifts right/left Gait velocity: decr Gait velocity interpretation: 1.31 - 2.62 ft/sec, indicative of limited community ambulator General Gait Details: Assist for lines and Futures trader    Modified Rankin (Stroke Patients Only)       Balance Overall balance assessment: Needs assistance Sitting-balance support: No upper extremity supported;Feet supported Sitting balance-Leahy Scale: Good     Standing balance support: No upper extremity supported;During functional activity Standing balance-Leahy Scale: Fair                              Cognition Arousal/Alertness: Awake/alert Behavior During Therapy: WFL for tasks assessed/performed Overall Cognitive Status: Within Functional Limits for tasks assessed                                  General Comments: Slight anxiety about managing things at home.      Exercises      General Comments General comments (skin integrity, edema, etc.): SpO2 86% on RA with amb. On 2L SpO2 > 90%      Pertinent Vitals/Pain Pain Assessment: No/denies pain    Home Living Family/patient expects to be discharged to:: Private residence Living Arrangements: Spouse/significant other Available Help at Discharge: Family;Available 24 hours/day(husband weak and sick. Son in law has been helping) Type of Home: House Home Access: Stairs to enter Entrance Stairs-Rails: Right Home Layout: Two level;Laundry or work area in [employer]: Stage manager Comments: Pt reports church has left them equipment to use    Prior Function Level of Independence: Independent      Comments: ADLs, IADLs, and reports "I take care of everything"   PT Goals (current goals can now be found in the care plan section) Acute Rehab PT Goals Patient Stated Goal: return home Progress towards PT goals: Progressing toward goals    Frequency    Min 3X/week      PT Plan Current plan remains appropriate    Co-evaluation              AM-PAC PT "6 Clicks" Mobility   Outcome Measure  Help needed turning from your back to your side while in a flat bed without using  bedrails?: None Help needed moving from lying on your back to sitting on the side of a flat bed without using bedrails?: None Help needed moving to and from a bed to a chair (including a wheelchair)?: A Little Help needed standing up from a chair using your arms (e.g., wheelchair or bedside chair)?: A Little Help needed to walk in hospital room?: A Little Help needed climbing 3-5 steps with a railing? : A Little 6 Click Score: 20    End of Session Equipment Utilized During Treatment: Oxygen Activity Tolerance: Patient tolerated treatment well Patient left: in chair;with call bell/phone  within reach Nurse Communication: Mobility status PT Visit Diagnosis: Other abnormalities of gait and mobility (R26.89);Unsteadiness on feet (R26.81);Muscle weakness (generalized) (M62.81)     Time: 9163-8466 PT Time Calculation (min) (ACUTE ONLY): 16 min  Charges:  $Gait Training: 8-22 mins                     Ajo Pager [phone] Office [place] 04/05/2020, 3:43 PM

## 2020-04-05 NOTE — Progress Notes (Addendum)
Tracy Vargas to be D/C'd Home per MD order.  Discussed with the patient and all questions fully answered.  VSS, Skin clean, dry and intact without evidence of skin break down, no evidence of skin tears noted. IV catheter discontinued intact. Site without signs and symptoms of complications. Dressing and pressure applied.  An After Visit Summary was printed and given to the patient. Patient received prescription.  D/c education completed with patient/family including follow up instructions, medication list, d/c activities limitations if indicated, with other d/c instructions as indicated by MD - patient able to verbalize understanding, all questions fully answered.   Patient instructed to return to ED, call 911, or call MD for any changes in condition.   Patient escorted via WC, and D/C home via private auto.  Pt family member Sindy Messing confirm pt oxygen tank was deliver home.  Patient was also educated to hold Warfarin 04/05/20, and 04/06/2020 and resume Home dose 04/07/2020, as order by pharmacist.    [name] 04/05/2020 4:19 PM

## 2020-04-05 NOTE — Evaluation (Signed)
Occupational Therapy Evaluation Patient Details Name: Tracy Vargas MRN: [id number] DOB: 13-Oct-1934 Today's Date: 04/05/2020    History of Present Illness 86 yo female presenting with COVID-19. PMH including CHF, AVR, and HTN.    Clinical Impression   PTA, pt was living with her husband and was independent performing ADLs, IADLs, and driving. Pt currently requiring Supervision-Min Guard A for ADLs. Pt presenting with decreased activity tolerance as seen by fatigue and decreased SpO2. SpO2 dropping to 80s on RA during activity; however unsure of reliability as pleth line was poor. Cueing pt for standing rest break and purse lip breathing; SpO2 returning to 90s with good pleth line. Educating pt on O2 management, energy conservation (issued handout), and purse lip breathing. Provided education and answered questions in preparation for dc later today. Recommend dc to home with HHOT for further OT to optimize safety, independence with ADLs, and return to PLOF.     Follow Up Recommendations  Home health OT;Supervision/Assistance - 24 hour    Equipment Recommendations  None recommended by OT(Pt reporting her church has provided DME needs)    Recommendations for Other Services PT consult     Precautions / Restrictions Precautions Precautions: Fall Restrictions Weight Bearing Restrictions: No      Mobility Bed Mobility               General bed mobility comments: Pt up in chair  Transfers Overall transfer level: Needs assistance Equipment used: None Transfers: Sit to/from Stand Sit to Stand: Min guard         General transfer comment: Min Guard A for safety    Balance Overall balance assessment: Needs assistance Sitting-balance support: No upper extremity supported;Feet supported Sitting balance-Leahy Scale: Good     Standing balance support: No upper extremity supported;During functional activity Standing balance-Leahy Scale: Fair                              ADL either performed or assessed with clinical judgement   ADL Overall ADL's : Needs assistance/impaired Eating/Feeding: Set up;Sitting   Grooming: Supervision/safety;Wash/dry hands;Wash/dry face;Standing Grooming Details (indicate cue type and reason): Once at sink, pt SpO2 dropping to low 80s (poor pleth line), cued for standing rest break and purse lip breathing. SpO2 elevating to 96% on RA.  Upper Body Bathing: Set up;Sitting   Lower Body Bathing: Min guard;Sit to/from stand   Upper Body Dressing : Supervision/safety;Set up;Sitting   Lower Body Dressing: Min guard;Sit to/from stand Lower Body Dressing Details (indicate cue type and reason): Pt able to bend forward to adjust socks Toilet Transfer: Min guard;BSC   Toileting- Clothing Manipulation and Hygiene: Min guard       Functional mobility during ADLs: Min guard General ADL Comments: Pt presenting with decreasedd activity tolerance as seen by fatigue and decreased SpO2.      Vision Baseline Vision/History: Wears glasses Patient Visual Report: No change from baseline       Perception     Praxis      Pertinent Vitals/Pain Pain Assessment: No/denies pain     Hand Dominance Right   Extremity/Trunk Assessment Upper Extremity Assessment Upper Extremity Assessment: Generalized weakness   Lower Extremity Assessment Lower Extremity Assessment: Defer to PT evaluation       Communication Communication Communication: HOH   Cognition Arousal/Alertness: Awake/alert Behavior During Therapy: WFL for tasks assessed/performed Overall Cognitive Status: Within Functional Limits for tasks assessed  General Comments: Slight anxiety about managing things at home.   General Comments  SpO2 dropping to 80s on RA (poor pleth line). Pt able to recover to 90s with standing rest break and purse lip breathing. Educating pt on need for rest breaks and purse lip brething between  each movement. Provided pt with education on [facility] and issued hand out. Pt verbalized ways she will implement into her daily routine.     Exercises     Shoulder Instructions      Home Living Family/patient expects to be discharged to:: Private residence Living Arrangements: Spouse/significant other Available Help at Discharge: Family;Available 24 hours/day(husband weak and sick. Son in law has been helping) Type of Home: House Home Access: Stairs to enter [employer] of Steps: 1 Entrance Stairs-Rails: Right Home Layout: Two level;Laundry or work area in basement     [employer] Shower/Tub: Aeronautical engineer: Theme park manager Comments: Pt reports church has left them equipment to use      Prior Functioning/Environment Level of Independence: Independent        Comments: ADLs, IADLs, and reports "I take care of everything"        OT Problem List: Decreased activity tolerance;Impaired balance (sitting and/or standing);Decreased knowledge of use of DME or AE;Decreased knowledge of precautions      OT Treatment/Interventions:      OT Goals(Current goals can be found in the care plan section) Acute Rehab OT Goals Patient Stated Goal: return home OT Goal Formulation: All assessment and education complete, DC therapy  OT Frequency:     Barriers to D/C:            Co-evaluation              AM-PAC OT "6 Clicks" Daily Activity     Outcome Measure Help from another person eating meals?: A Little Help from another person taking care of personal grooming?: A Little Help from another person toileting, which includes using toliet, bedpan, or urinal?: A Little Help from another person bathing (including washing, rinsing, drying)?: A Little Help from another person to put on and taking off regular upper body clothing?: A Little Help from another person to put on and taking off regular lower body clothing?: A Little 6 Click Score:  18   End of Session Nurse Communication: Mobility status  Activity Tolerance: Patient tolerated treatment well Patient left: in chair;with call bell/phone within reach  OT Visit Diagnosis: Unsteadiness on feet (R26.81);Other abnormalities of gait and mobility (R26.89);Muscle weakness (generalized) (M62.81)                Time: 1204-1229 OT Time Calculation (min): 25 min Charges:  OT General Charges $OT Visit: 1 Visit OT Evaluation $OT Eval Moderate Complexity: 1 Mod OT Treatments $Self Care/Home Management : 8-22 mins  Layn Kye MSOT, OTR/L Acute Rehab Pager: [phone] Office: [place] 04/05/2020, 1:28 PM

## 2020-04-06 ENCOUNTER — Telehealth: Payer: Self-pay | Admitting: *Deleted

## 2020-04-06 LAB — CULTURE, BLOOD (ROUTINE X 2): Culture: NO GROWTH

## 2020-04-06 NOTE — Telephone Encounter (Signed)
Called the home of the pt to speak with husband and the pt advised that they are both having memory issues while having COVID and to speak with their son [name] regarding new medications the pt and the husband are on. Advised that I would do so. Called [name] pt' son and he advised that they both have COVID and both are not thinking clearly and he will assist with their meds. [name] stated the pt was prescribed an antibiotic today but he hasn't picked up Mrs Kreiter meds at this time so I called the pharmacy and they advised the new med will be Cephalaxin 500mg  BID x 7 days and will pick up today. He is aware the med does not interact with Warfarin.  Also, he asked about the dose of Warfarin she should take, advised since she did hold yesterday's dose to take 1/2 tablet (2.5mg ) tonight then resume normal dose of 1 tablet daily except 1/2 tablet on Monday and Wednesday.  Spoke with [facility] and gave an order and faxed an order to check the pt's INR on 04/08/2020 and fax to [phone] or call [phone].

## 2020-04-08 ENCOUNTER — Ambulatory Visit (INDEPENDENT_AMBULATORY_CARE_PROVIDER_SITE_OTHER): Payer: Medicare HMO | Admitting: *Deleted

## 2020-04-08 DIAGNOSIS — Z5181 Encounter for therapeutic drug level monitoring: Secondary | ICD-10-CM

## 2020-04-08 DIAGNOSIS — I10 Essential (primary) hypertension: Secondary | ICD-10-CM | POA: Diagnosis not present

## 2020-04-08 DIAGNOSIS — I5032 Chronic diastolic (congestive) heart failure: Secondary | ICD-10-CM | POA: Diagnosis not present

## 2020-04-08 DIAGNOSIS — Z952 Presence of prosthetic heart valve: Secondary | ICD-10-CM

## 2020-04-08 DIAGNOSIS — U071 COVID-19: Secondary | ICD-10-CM | POA: Diagnosis not present

## 2020-04-08 LAB — PROTIME-INR: INR: 2.5 — AB (ref 0.9–1.1)

## 2020-04-12 DIAGNOSIS — E860 Dehydration: Secondary | ICD-10-CM | POA: Diagnosis not present

## 2020-04-12 DIAGNOSIS — J1282 Pneumonia due to coronavirus disease 2019: Secondary | ICD-10-CM | POA: Diagnosis not present

## 2020-04-12 DIAGNOSIS — K529 Noninfective gastroenteritis and colitis, unspecified: Secondary | ICD-10-CM | POA: Diagnosis not present

## 2020-04-12 DIAGNOSIS — J9601 Acute respiratory failure with hypoxia: Secondary | ICD-10-CM | POA: Diagnosis not present

## 2020-04-12 DIAGNOSIS — I110 Hypertensive heart disease with heart failure: Secondary | ICD-10-CM | POA: Diagnosis not present

## 2020-04-12 DIAGNOSIS — I5032 Chronic diastolic (congestive) heart failure: Secondary | ICD-10-CM | POA: Diagnosis not present

## 2020-04-12 DIAGNOSIS — I058 Other rheumatic mitral valve diseases: Secondary | ICD-10-CM | POA: Diagnosis not present

## 2020-04-12 DIAGNOSIS — I0981 Rheumatic heart failure: Secondary | ICD-10-CM | POA: Diagnosis not present

## 2020-04-12 DIAGNOSIS — U071 COVID-19: Secondary | ICD-10-CM | POA: Diagnosis not present

## 2020-04-12 DIAGNOSIS — K5900 Constipation, unspecified: Secondary | ICD-10-CM | POA: Diagnosis not present

## 2020-04-13 DIAGNOSIS — I10 Essential (primary) hypertension: Secondary | ICD-10-CM | POA: Diagnosis not present

## 2020-04-13 DIAGNOSIS — U071 COVID-19: Secondary | ICD-10-CM | POA: Diagnosis not present

## 2020-04-13 DIAGNOSIS — I5032 Chronic diastolic (congestive) heart failure: Secondary | ICD-10-CM | POA: Diagnosis not present

## 2020-04-13 LAB — PROTIME-INR: INR: 6.2 — AB (ref <=1.1)

## 2020-04-14 ENCOUNTER — Telehealth: Payer: Self-pay | Admitting: Emergency Medicine

## 2020-04-14 ENCOUNTER — Ambulatory Visit (INDEPENDENT_AMBULATORY_CARE_PROVIDER_SITE_OTHER): Payer: Medicare HMO | Admitting: *Deleted

## 2020-04-14 DIAGNOSIS — K5900 Constipation, unspecified: Secondary | ICD-10-CM | POA: Diagnosis not present

## 2020-04-14 DIAGNOSIS — U071 COVID-19: Secondary | ICD-10-CM | POA: Diagnosis not present

## 2020-04-14 DIAGNOSIS — Z5181 Encounter for therapeutic drug level monitoring: Secondary | ICD-10-CM

## 2020-04-14 DIAGNOSIS — E860 Dehydration: Secondary | ICD-10-CM | POA: Diagnosis not present

## 2020-04-14 DIAGNOSIS — I5032 Chronic diastolic (congestive) heart failure: Secondary | ICD-10-CM | POA: Diagnosis not present

## 2020-04-14 DIAGNOSIS — I058 Other rheumatic mitral valve diseases: Secondary | ICD-10-CM | POA: Diagnosis not present

## 2020-04-14 DIAGNOSIS — Z952 Presence of prosthetic heart valve: Secondary | ICD-10-CM | POA: Diagnosis not present

## 2020-04-14 DIAGNOSIS — J1282 Pneumonia due to coronavirus disease 2019: Secondary | ICD-10-CM | POA: Diagnosis not present

## 2020-04-14 DIAGNOSIS — I110 Hypertensive heart disease with heart failure: Secondary | ICD-10-CM | POA: Diagnosis not present

## 2020-04-14 DIAGNOSIS — K529 Noninfective gastroenteritis and colitis, unspecified: Secondary | ICD-10-CM | POA: Diagnosis not present

## 2020-04-14 DIAGNOSIS — I0981 Rheumatic heart failure: Secondary | ICD-10-CM | POA: Diagnosis not present

## 2020-04-14 DIAGNOSIS — Z7689 Persons encountering health services in other specified circumstances: Secondary | ICD-10-CM | POA: Diagnosis not present

## 2020-04-14 DIAGNOSIS — J9601 Acute respiratory failure with hypoxia: Secondary | ICD-10-CM | POA: Diagnosis not present

## 2020-04-14 LAB — PROTIME-INR: INR: 6.9 — AB (ref 0.9–1.1)

## 2020-04-14 NOTE — Telephone Encounter (Signed)
Kim with [facility] calling regarding a critical INR. She had to hang up to take another call before I could call triage.

## 2020-04-14 NOTE — Telephone Encounter (Signed)
Updated anti coag encounter for today and gave [name] updated dosing instructions. [facility] will recheck INR on 04/19/2020. Order faxed.   [facility] fax: [phone]

## 2020-04-14 NOTE — Telephone Encounter (Signed)
[facility] received fax this AM of INR of 6.2, and was addressed in anticoag encounter from today.   Called Kim back to clarify that she was calling in regards to INR of 6.2, Selena Batten stated that she was calling in regards to INR that was drawn today that was 6.9. She stated that Armando Gang saw that pt's INR was 6.2 yesterday and recommended pt get INR drawn again today and it was 6.9. Selena Batten stated that she does not have the result to fax over yet but a critical was called and was calling with the results. Verbally verified results with Caryl Never that pt's INR was 6.9 today.

## 2020-04-14 NOTE — Patient Instructions (Addendum)
Description   Spoke with [name] pt's son and pt advised pt to hold Warfarin dose today (6/9), hold 6/10 dose, hold 6/11 dose, hold 6/12 dose,  then start taking 1/2 tablet daily except 1 tablet on Wednesday and Friday (may need another dose reduction). CALL PT WITH NEXT INR RESULT. Recheck INR on Monday via [facility]. Call [facility] with any questions [phone].

## 2020-04-15 DIAGNOSIS — Z7901 Long term (current) use of anticoagulants: Secondary | ICD-10-CM | POA: Diagnosis not present

## 2020-04-15 DIAGNOSIS — U071 COVID-19: Secondary | ICD-10-CM | POA: Diagnosis not present

## 2020-04-15 DIAGNOSIS — I10 Essential (primary) hypertension: Secondary | ICD-10-CM | POA: Diagnosis not present

## 2020-04-15 DIAGNOSIS — J1282 Pneumonia due to coronavirus disease 2019: Secondary | ICD-10-CM | POA: Diagnosis not present

## 2020-04-15 DIAGNOSIS — E785 Hyperlipidemia, unspecified: Secondary | ICD-10-CM | POA: Diagnosis not present

## 2020-04-15 DIAGNOSIS — Z09 Encounter for follow-up examination after completed treatment for conditions other than malignant neoplasm: Secondary | ICD-10-CM | POA: Diagnosis not present

## 2020-04-15 DIAGNOSIS — R0902 Hypoxemia: Secondary | ICD-10-CM | POA: Diagnosis not present

## 2020-04-16 DIAGNOSIS — D509 Iron deficiency anemia, unspecified: Secondary | ICD-10-CM | POA: Diagnosis not present

## 2020-04-16 DIAGNOSIS — I4891 Unspecified atrial fibrillation: Secondary | ICD-10-CM | POA: Diagnosis not present

## 2020-04-16 DIAGNOSIS — E7800 Pure hypercholesterolemia, unspecified: Secondary | ICD-10-CM | POA: Diagnosis not present

## 2020-04-16 DIAGNOSIS — I10 Essential (primary) hypertension: Secondary | ICD-10-CM | POA: Diagnosis not present

## 2020-04-16 DIAGNOSIS — E785 Hyperlipidemia, unspecified: Secondary | ICD-10-CM | POA: Diagnosis not present

## 2020-04-19 ENCOUNTER — Ambulatory Visit (INDEPENDENT_AMBULATORY_CARE_PROVIDER_SITE_OTHER): Payer: Medicare HMO | Admitting: *Deleted

## 2020-04-19 DIAGNOSIS — I0981 Rheumatic heart failure: Secondary | ICD-10-CM | POA: Diagnosis not present

## 2020-04-19 DIAGNOSIS — U071 COVID-19: Secondary | ICD-10-CM | POA: Diagnosis not present

## 2020-04-19 DIAGNOSIS — Z5181 Encounter for therapeutic drug level monitoring: Secondary | ICD-10-CM | POA: Diagnosis not present

## 2020-04-19 DIAGNOSIS — I5032 Chronic diastolic (congestive) heart failure: Secondary | ICD-10-CM | POA: Diagnosis not present

## 2020-04-19 DIAGNOSIS — J9601 Acute respiratory failure with hypoxia: Secondary | ICD-10-CM | POA: Diagnosis not present

## 2020-04-19 DIAGNOSIS — J1282 Pneumonia due to coronavirus disease 2019: Secondary | ICD-10-CM | POA: Diagnosis not present

## 2020-04-19 DIAGNOSIS — Z952 Presence of prosthetic heart valve: Secondary | ICD-10-CM

## 2020-04-19 DIAGNOSIS — K529 Noninfective gastroenteritis and colitis, unspecified: Secondary | ICD-10-CM | POA: Diagnosis not present

## 2020-04-19 DIAGNOSIS — I110 Hypertensive heart disease with heart failure: Secondary | ICD-10-CM | POA: Diagnosis not present

## 2020-04-19 DIAGNOSIS — E860 Dehydration: Secondary | ICD-10-CM | POA: Diagnosis not present

## 2020-04-19 DIAGNOSIS — K5900 Constipation, unspecified: Secondary | ICD-10-CM | POA: Diagnosis not present

## 2020-04-19 DIAGNOSIS — I058 Other rheumatic mitral valve diseases: Secondary | ICD-10-CM | POA: Diagnosis not present

## 2020-04-19 LAB — PROTIME-INR: INR: 1.3 — AB (ref 0.9–1.1)

## 2020-04-21 DIAGNOSIS — J9601 Acute respiratory failure with hypoxia: Secondary | ICD-10-CM | POA: Diagnosis not present

## 2020-04-21 DIAGNOSIS — I110 Hypertensive heart disease with heart failure: Secondary | ICD-10-CM | POA: Diagnosis not present

## 2020-04-21 DIAGNOSIS — I058 Other rheumatic mitral valve diseases: Secondary | ICD-10-CM | POA: Diagnosis not present

## 2020-04-21 DIAGNOSIS — I0981 Rheumatic heart failure: Secondary | ICD-10-CM | POA: Diagnosis not present

## 2020-04-21 DIAGNOSIS — E860 Dehydration: Secondary | ICD-10-CM | POA: Diagnosis not present

## 2020-04-21 DIAGNOSIS — U071 COVID-19: Secondary | ICD-10-CM | POA: Diagnosis not present

## 2020-04-21 DIAGNOSIS — J1282 Pneumonia due to coronavirus disease 2019: Secondary | ICD-10-CM | POA: Diagnosis not present

## 2020-04-21 DIAGNOSIS — I5032 Chronic diastolic (congestive) heart failure: Secondary | ICD-10-CM | POA: Diagnosis not present

## 2020-04-21 DIAGNOSIS — K5900 Constipation, unspecified: Secondary | ICD-10-CM | POA: Diagnosis not present

## 2020-04-21 DIAGNOSIS — K529 Noninfective gastroenteritis and colitis, unspecified: Secondary | ICD-10-CM | POA: Diagnosis not present

## 2020-04-23 ENCOUNTER — Other Ambulatory Visit: Payer: Self-pay | Admitting: Family Medicine

## 2020-04-23 ENCOUNTER — Ambulatory Visit
Admission: RE | Admit: 2020-04-23 | Discharge: 2020-04-23 | Disposition: A | Discharge status: Home / Self Care | Payer: Medicare HMO | Source: Ambulatory Visit | Attending: Family Medicine | Admitting: Family Medicine

## 2020-04-23 DIAGNOSIS — J1282 COVID-19: Secondary | ICD-10-CM

## 2020-04-23 DIAGNOSIS — U071 COVID-19: Secondary | ICD-10-CM | POA: Diagnosis not present

## 2020-04-23 DIAGNOSIS — K449 Diaphragmatic hernia without obstruction or gangrene: Secondary | ICD-10-CM | POA: Diagnosis not present

## 2020-04-23 DIAGNOSIS — J189 Pneumonia, unspecified organism: Secondary | ICD-10-CM | POA: Diagnosis not present

## 2020-04-23 DIAGNOSIS — I517 Cardiomegaly: Secondary | ICD-10-CM | POA: Diagnosis not present

## 2020-04-26 DIAGNOSIS — J9601 Acute respiratory failure with hypoxia: Secondary | ICD-10-CM | POA: Diagnosis not present

## 2020-04-26 DIAGNOSIS — K529 Noninfective gastroenteritis and colitis, unspecified: Secondary | ICD-10-CM | POA: Diagnosis not present

## 2020-04-26 DIAGNOSIS — E860 Dehydration: Secondary | ICD-10-CM | POA: Diagnosis not present

## 2020-04-26 DIAGNOSIS — I058 Other rheumatic mitral valve diseases: Secondary | ICD-10-CM | POA: Diagnosis not present

## 2020-04-26 DIAGNOSIS — Z7689 Persons encountering health services in other specified circumstances: Secondary | ICD-10-CM | POA: Diagnosis not present

## 2020-04-26 DIAGNOSIS — I110 Hypertensive heart disease with heart failure: Secondary | ICD-10-CM | POA: Diagnosis not present

## 2020-04-26 DIAGNOSIS — U071 COVID-19: Secondary | ICD-10-CM | POA: Diagnosis not present

## 2020-04-26 DIAGNOSIS — I0981 Rheumatic heart failure: Secondary | ICD-10-CM | POA: Diagnosis not present

## 2020-04-26 DIAGNOSIS — J1282 Pneumonia due to coronavirus disease 2019: Secondary | ICD-10-CM | POA: Diagnosis not present

## 2020-04-26 DIAGNOSIS — I5032 Chronic diastolic (congestive) heart failure: Secondary | ICD-10-CM | POA: Diagnosis not present

## 2020-04-26 DIAGNOSIS — K5900 Constipation, unspecified: Secondary | ICD-10-CM | POA: Diagnosis not present

## 2020-04-26 LAB — PROTIME-INR: INR: 2 — AB (ref 0.9–1.1)

## 2020-04-27 ENCOUNTER — Ambulatory Visit (INDEPENDENT_AMBULATORY_CARE_PROVIDER_SITE_OTHER): Payer: Medicare HMO | Admitting: *Deleted

## 2020-04-27 DIAGNOSIS — Z952 Presence of prosthetic heart valve: Secondary | ICD-10-CM

## 2020-04-27 DIAGNOSIS — Z5181 Encounter for therapeutic drug level monitoring: Secondary | ICD-10-CM

## 2020-04-27 NOTE — Patient Instructions (Signed)
Description   Spoke with pt and instructed pt to continue taking Warfarin 1/2 tablet daily except 1 tablet on Sundays and Thursdays. CALL PT WITH INR RESULT. Recheck INR on Tuesday via [facility]. Call [facility] with any questions [phone].

## 2020-04-28 DIAGNOSIS — E860 Dehydration: Secondary | ICD-10-CM | POA: Diagnosis not present

## 2020-04-28 DIAGNOSIS — U071 COVID-19: Secondary | ICD-10-CM | POA: Diagnosis not present

## 2020-04-28 DIAGNOSIS — J1282 Pneumonia due to coronavirus disease 2019: Secondary | ICD-10-CM | POA: Diagnosis not present

## 2020-04-28 DIAGNOSIS — K5900 Constipation, unspecified: Secondary | ICD-10-CM | POA: Diagnosis not present

## 2020-04-28 DIAGNOSIS — K529 Noninfective gastroenteritis and colitis, unspecified: Secondary | ICD-10-CM | POA: Diagnosis not present

## 2020-04-28 DIAGNOSIS — J9601 Acute respiratory failure with hypoxia: Secondary | ICD-10-CM | POA: Diagnosis not present

## 2020-04-28 DIAGNOSIS — I0981 Rheumatic heart failure: Secondary | ICD-10-CM | POA: Diagnosis not present

## 2020-04-28 DIAGNOSIS — I5032 Chronic diastolic (congestive) heart failure: Secondary | ICD-10-CM | POA: Diagnosis not present

## 2020-04-28 DIAGNOSIS — I058 Other rheumatic mitral valve diseases: Secondary | ICD-10-CM | POA: Diagnosis not present

## 2020-04-28 DIAGNOSIS — I110 Hypertensive heart disease with heart failure: Secondary | ICD-10-CM | POA: Diagnosis not present

## 2020-05-03 ENCOUNTER — Ambulatory Visit (INDEPENDENT_AMBULATORY_CARE_PROVIDER_SITE_OTHER): Payer: Medicare HMO | Admitting: *Deleted

## 2020-05-03 DIAGNOSIS — I110 Hypertensive heart disease with heart failure: Secondary | ICD-10-CM | POA: Diagnosis not present

## 2020-05-03 DIAGNOSIS — E860 Dehydration: Secondary | ICD-10-CM | POA: Diagnosis not present

## 2020-05-03 DIAGNOSIS — K5900 Constipation, unspecified: Secondary | ICD-10-CM | POA: Diagnosis not present

## 2020-05-03 DIAGNOSIS — J1282 Pneumonia due to coronavirus disease 2019: Secondary | ICD-10-CM | POA: Diagnosis not present

## 2020-05-03 DIAGNOSIS — I5032 Chronic diastolic (congestive) heart failure: Secondary | ICD-10-CM | POA: Diagnosis not present

## 2020-05-03 DIAGNOSIS — K529 Noninfective gastroenteritis and colitis, unspecified: Secondary | ICD-10-CM | POA: Diagnosis not present

## 2020-05-03 DIAGNOSIS — Z5181 Encounter for therapeutic drug level monitoring: Secondary | ICD-10-CM | POA: Diagnosis not present

## 2020-05-03 DIAGNOSIS — J9601 Acute respiratory failure with hypoxia: Secondary | ICD-10-CM | POA: Diagnosis not present

## 2020-05-03 DIAGNOSIS — I0981 Rheumatic heart failure: Secondary | ICD-10-CM | POA: Diagnosis not present

## 2020-05-03 DIAGNOSIS — I10 Essential (primary) hypertension: Secondary | ICD-10-CM | POA: Diagnosis not present

## 2020-05-03 DIAGNOSIS — I058 Other rheumatic mitral valve diseases: Secondary | ICD-10-CM | POA: Diagnosis not present

## 2020-05-03 DIAGNOSIS — Z952 Presence of prosthetic heart valve: Secondary | ICD-10-CM

## 2020-05-03 DIAGNOSIS — U071 COVID-19: Secondary | ICD-10-CM | POA: Diagnosis not present

## 2020-05-03 LAB — PROTIME-INR: INR: 1.8 — AB (ref 0.9–1.1)

## 2020-05-05 DIAGNOSIS — J9601 Acute respiratory failure with hypoxia: Secondary | ICD-10-CM | POA: Diagnosis not present

## 2020-05-05 DIAGNOSIS — I110 Hypertensive heart disease with heart failure: Secondary | ICD-10-CM | POA: Diagnosis not present

## 2020-05-05 DIAGNOSIS — I058 Other rheumatic mitral valve diseases: Secondary | ICD-10-CM | POA: Diagnosis not present

## 2020-05-05 DIAGNOSIS — J1282 Pneumonia due to coronavirus disease 2019: Secondary | ICD-10-CM | POA: Diagnosis not present

## 2020-05-05 DIAGNOSIS — I0981 Rheumatic heart failure: Secondary | ICD-10-CM | POA: Diagnosis not present

## 2020-05-05 DIAGNOSIS — K5900 Constipation, unspecified: Secondary | ICD-10-CM | POA: Diagnosis not present

## 2020-05-05 DIAGNOSIS — K529 Noninfective gastroenteritis and colitis, unspecified: Secondary | ICD-10-CM | POA: Diagnosis not present

## 2020-05-05 DIAGNOSIS — I5032 Chronic diastolic (congestive) heart failure: Secondary | ICD-10-CM | POA: Diagnosis not present

## 2020-05-05 DIAGNOSIS — U071 COVID-19: Secondary | ICD-10-CM | POA: Diagnosis not present

## 2020-05-05 DIAGNOSIS — E860 Dehydration: Secondary | ICD-10-CM | POA: Diagnosis not present

## 2020-05-11 DIAGNOSIS — Z952 Presence of prosthetic heart valve: Secondary | ICD-10-CM | POA: Diagnosis not present

## 2020-05-11 DIAGNOSIS — I5032 Chronic diastolic (congestive) heart failure: Secondary | ICD-10-CM | POA: Diagnosis not present

## 2020-05-11 DIAGNOSIS — I10 Essential (primary) hypertension: Secondary | ICD-10-CM | POA: Diagnosis not present

## 2020-05-11 LAB — PROTIME-INR: INR: 1.7 — AB (ref 0.9–1.1)

## 2020-05-12 ENCOUNTER — Ambulatory Visit (INDEPENDENT_AMBULATORY_CARE_PROVIDER_SITE_OTHER): Payer: Medicare HMO | Admitting: *Deleted

## 2020-05-12 DIAGNOSIS — Z5181 Encounter for therapeutic drug level monitoring: Secondary | ICD-10-CM

## 2020-05-12 DIAGNOSIS — Z952 Presence of prosthetic heart valve: Secondary | ICD-10-CM

## 2020-05-17 DIAGNOSIS — I10 Essential (primary) hypertension: Secondary | ICD-10-CM | POA: Diagnosis not present

## 2020-05-17 DIAGNOSIS — I4891 Unspecified atrial fibrillation: Secondary | ICD-10-CM | POA: Diagnosis not present

## 2020-05-17 DIAGNOSIS — E785 Hyperlipidemia, unspecified: Secondary | ICD-10-CM | POA: Diagnosis not present

## 2020-05-17 DIAGNOSIS — D509 Iron deficiency anemia, unspecified: Secondary | ICD-10-CM | POA: Diagnosis not present

## 2020-05-17 DIAGNOSIS — E7800 Pure hypercholesterolemia, unspecified: Secondary | ICD-10-CM | POA: Diagnosis not present

## 2020-05-18 ENCOUNTER — Ambulatory Visit (INDEPENDENT_AMBULATORY_CARE_PROVIDER_SITE_OTHER): Payer: Medicare HMO | Admitting: *Deleted

## 2020-05-18 DIAGNOSIS — Z952 Presence of prosthetic heart valve: Secondary | ICD-10-CM | POA: Diagnosis not present

## 2020-05-18 DIAGNOSIS — I10 Essential (primary) hypertension: Secondary | ICD-10-CM | POA: Diagnosis not present

## 2020-05-18 DIAGNOSIS — Z5181 Encounter for therapeutic drug level monitoring: Secondary | ICD-10-CM | POA: Diagnosis not present

## 2020-05-18 DIAGNOSIS — I5032 Chronic diastolic (congestive) heart failure: Secondary | ICD-10-CM | POA: Diagnosis not present

## 2020-05-18 DIAGNOSIS — Z7901 Long term (current) use of anticoagulants: Secondary | ICD-10-CM | POA: Diagnosis not present

## 2020-05-18 LAB — PROTIME-INR: INR: 2.5 — AB (ref 0.9–1.1)

## 2020-05-18 NOTE — Patient Instructions (Signed)
Description   Spoke with pt and instructed pt to continue taking 1/2 tablet daily except 1 tablet on Mondays, Wednesdays and Saturdays. CALL PT WITH INR RESULT. Recheck INR in 1 week via [facility]. Call [facility] with any questions [phone].

## 2020-05-25 ENCOUNTER — Ambulatory Visit (INDEPENDENT_AMBULATORY_CARE_PROVIDER_SITE_OTHER): Payer: Medicare HMO | Admitting: *Deleted

## 2020-05-25 DIAGNOSIS — I10 Essential (primary) hypertension: Secondary | ICD-10-CM | POA: Diagnosis not present

## 2020-05-25 DIAGNOSIS — Z952 Presence of prosthetic heart valve: Secondary | ICD-10-CM

## 2020-05-25 DIAGNOSIS — Z5181 Encounter for therapeutic drug level monitoring: Secondary | ICD-10-CM

## 2020-05-25 DIAGNOSIS — Z7901 Long term (current) use of anticoagulants: Secondary | ICD-10-CM | POA: Diagnosis not present

## 2020-05-25 DIAGNOSIS — I5032 Chronic diastolic (congestive) heart failure: Secondary | ICD-10-CM | POA: Diagnosis not present

## 2020-05-25 LAB — PROTIME-INR: INR: 2.2 — AB (ref 0.9–1.1)

## 2020-05-25 NOTE — Patient Instructions (Signed)
Description   Spoke with pt and instructed pt to continue on same dosage 1/2 tablet daily except 1 tablet on Mondays, Wednesdays and Saturdays. CALL PT WITH INR RESULT. Recheck INR in 2 weeks via [facility]. Order faxed to [facility] [phone].  Call [facility] with any questions [phone].

## 2020-06-08 ENCOUNTER — Ambulatory Visit (INDEPENDENT_AMBULATORY_CARE_PROVIDER_SITE_OTHER): Payer: Medicare HMO | Admitting: Cardiology

## 2020-06-08 DIAGNOSIS — Z5181 Encounter for therapeutic drug level monitoring: Secondary | ICD-10-CM

## 2020-06-08 DIAGNOSIS — Z7901 Long term (current) use of anticoagulants: Secondary | ICD-10-CM | POA: Diagnosis not present

## 2020-06-08 DIAGNOSIS — Z952 Presence of prosthetic heart valve: Secondary | ICD-10-CM

## 2020-06-08 LAB — PROTIME-INR: INR: 1.9 — AB (ref 0.9–1.1)

## 2020-06-08 NOTE — Progress Notes (Signed)
Order faxed to [facility] to have INR checked.  °

## 2020-06-08 NOTE — Patient Instructions (Signed)
Description   Spoke with pt and instructed pt  to take 1 tablet today and then to continue on same dosage 1/2 tablet daily except 1 tablet on Mondays, Wednesdays and Saturdays. Recheck INR in 3 weeks via [facility]. Order faxed to [facility] [phone].  Call Cou[facility]in Clinic with any questions [phone].

## 2020-06-10 IMAGING — DX DG CHEST 1V PORT
1 series · 1 of 1 positions shown · non-contrast
Comparison: 04/09/2013

CLINICAL DATA: Shortness of breath, hypoxia, 8P14N-L8 positive

EXAM:
PORTABLE CHEST 1 VIEW

[chest ap]
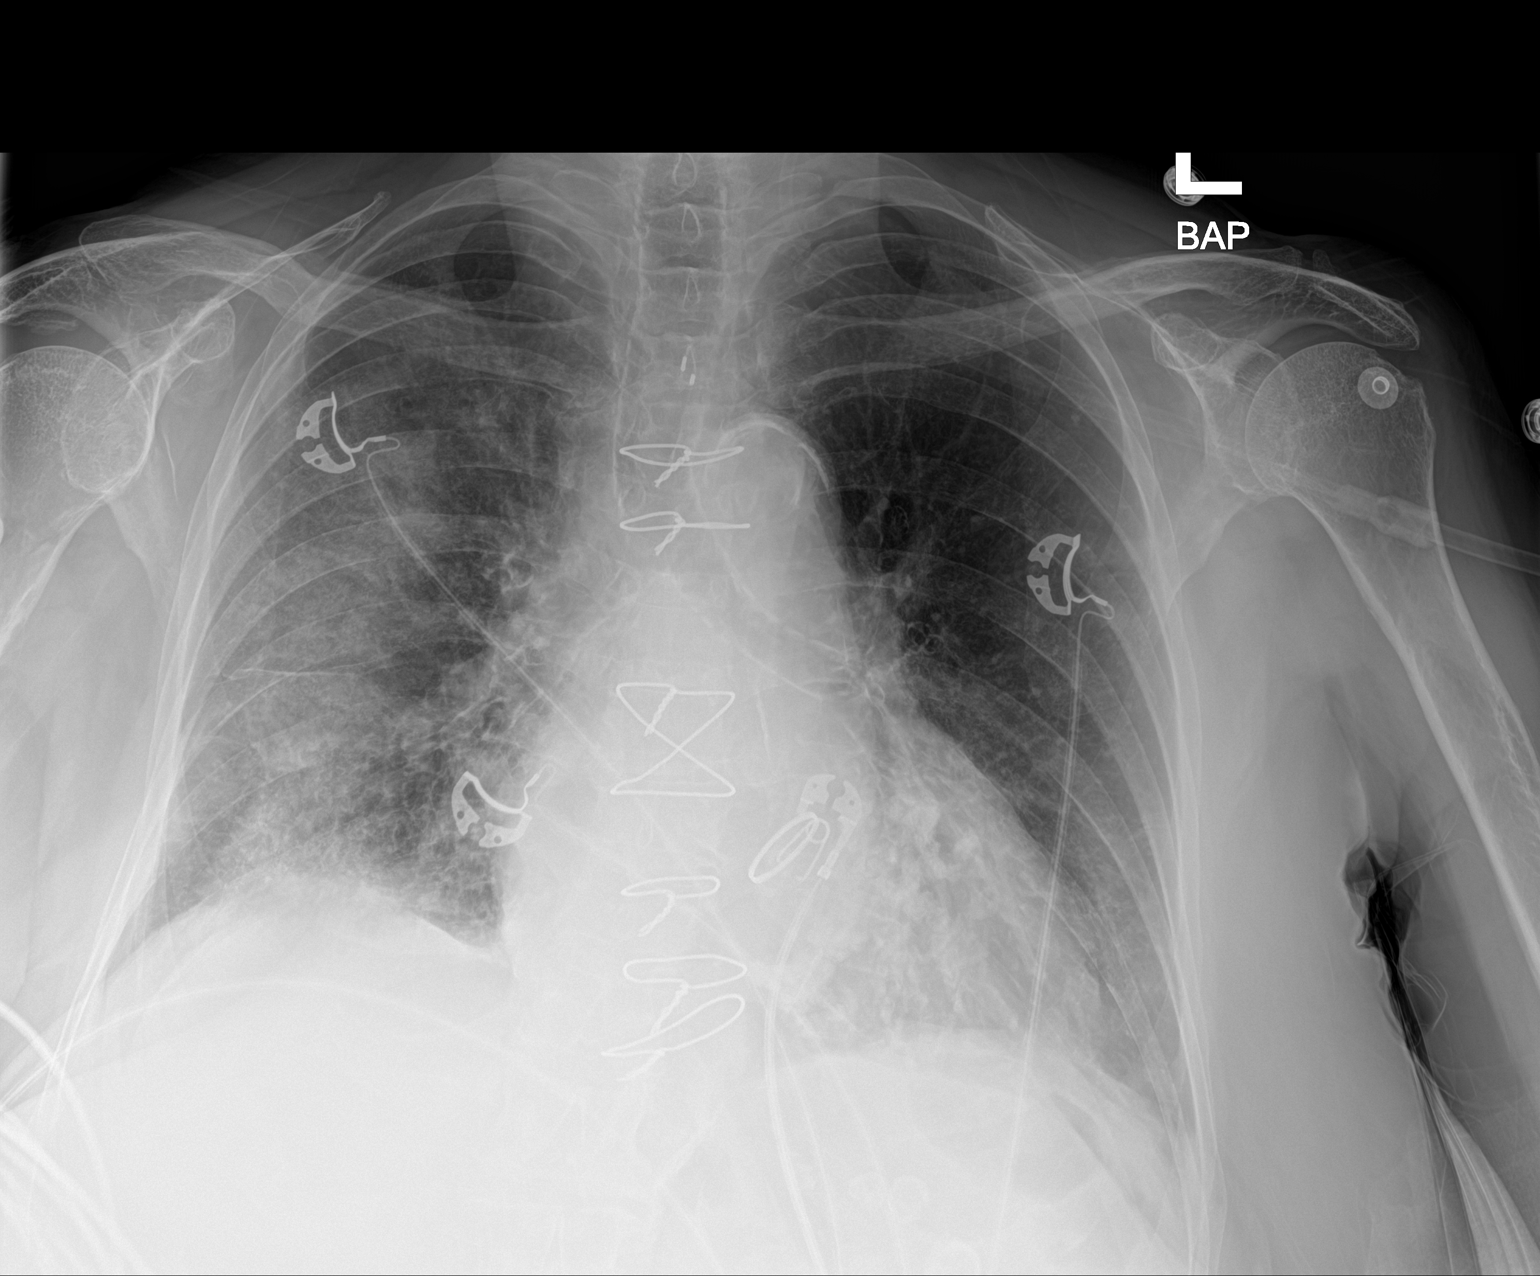

[1 of 1 positions shown; findings below may reference images not displayed]

FINDINGS: Single frontal view of the chest demonstrates a stable cardiac
silhouette, with extensive calcification of the mitral annulus.
Postsurgical changes from median sternotomy. Moderate
atherosclerosis within the thoracic aorta.

There is interstitial prominence with superimposed ground-glass
airspace disease bilaterally, with relative sparing of the left
upper lung zone. No effusion or pneumothorax. No acute bony
abnormality.
IMPRESSION: 1. Bilateral multifocal ground-glass airspace disease sparing the
left upper lung zone, consistent with multifocal pneumonia.
Appearance and distribution certainly compatible with given history
of 8P14N-L8.

## 2020-06-29 ENCOUNTER — Other Ambulatory Visit: Payer: Self-pay | Admitting: Cardiology

## 2020-06-30 ENCOUNTER — Telehealth: Payer: Self-pay

## 2020-06-30 DIAGNOSIS — Z7901 Long term (current) use of anticoagulants: Secondary | ICD-10-CM | POA: Diagnosis not present

## 2020-06-30 LAB — PROTIME-INR: INR: 2.5 — AB (ref 0.9–1.1)

## 2020-06-30 NOTE — Telephone Encounter (Signed)
Unable to lmom for overdue inr 

## 2020-07-01 ENCOUNTER — Ambulatory Visit (INDEPENDENT_AMBULATORY_CARE_PROVIDER_SITE_OTHER): Payer: Medicare HMO

## 2020-07-01 DIAGNOSIS — Z5181 Encounter for therapeutic drug level monitoring: Secondary | ICD-10-CM

## 2020-07-01 DIAGNOSIS — Z952 Presence of prosthetic heart valve: Secondary | ICD-10-CM

## 2020-07-19 DIAGNOSIS — Z7901 Long term (current) use of anticoagulants: Secondary | ICD-10-CM | POA: Diagnosis not present

## 2020-07-19 LAB — PROTIME-INR: INR: 2.3 — AB (ref 0.9–1.1)

## 2020-07-20 ENCOUNTER — Ambulatory Visit (INDEPENDENT_AMBULATORY_CARE_PROVIDER_SITE_OTHER): Payer: Medicare HMO | Admitting: Cardiology

## 2020-07-20 DIAGNOSIS — Z5181 Encounter for therapeutic drug level monitoring: Secondary | ICD-10-CM | POA: Diagnosis not present

## 2020-07-20 DIAGNOSIS — Z952 Presence of prosthetic heart valve: Secondary | ICD-10-CM

## 2020-07-20 NOTE — Patient Instructions (Signed)
Spoke with pt and instructed pt to resume taking 1/2 tablet daily except 1 tablet on Mondays, Wednesdays and Saturdays. Recheck INR in 3 weeks via [facility]. Order faxed to [facility] [phone].  Call [facility]in Clinic with any questions [phone].

## 2020-07-21 DIAGNOSIS — R69 Illness, unspecified: Secondary | ICD-10-CM | POA: Diagnosis not present

## 2020-07-29 DIAGNOSIS — I4891 Unspecified atrial fibrillation: Secondary | ICD-10-CM | POA: Diagnosis not present

## 2020-07-29 DIAGNOSIS — E7800 Pure hypercholesterolemia, unspecified: Secondary | ICD-10-CM | POA: Diagnosis not present

## 2020-07-29 DIAGNOSIS — E785 Hyperlipidemia, unspecified: Secondary | ICD-10-CM | POA: Diagnosis not present

## 2020-07-29 DIAGNOSIS — I10 Essential (primary) hypertension: Secondary | ICD-10-CM | POA: Diagnosis not present

## 2020-07-29 DIAGNOSIS — D509 Iron deficiency anemia, unspecified: Secondary | ICD-10-CM | POA: Diagnosis not present

## 2020-08-09 ENCOUNTER — Ambulatory Visit (INDEPENDENT_AMBULATORY_CARE_PROVIDER_SITE_OTHER): Payer: Medicare HMO | Admitting: Cardiology

## 2020-08-09 DIAGNOSIS — Z952 Presence of prosthetic heart valve: Secondary | ICD-10-CM | POA: Diagnosis not present

## 2020-08-09 DIAGNOSIS — Z7901 Long term (current) use of anticoagulants: Secondary | ICD-10-CM | POA: Diagnosis not present

## 2020-08-09 DIAGNOSIS — Z5181 Encounter for therapeutic drug level monitoring: Secondary | ICD-10-CM

## 2020-08-09 LAB — POCT INR: INR: 3.1 — AB (ref 2.0–3.0)

## 2020-08-09 NOTE — Patient Instructions (Signed)
Description   Spoke with pt and instructed for pt to hold warfarin today and  continue  taking 1/2 tablet daily except 1 tablet on Mondays, Wednesdays and Saturdays. Recheck INR in 2 weeks via [facility]. Order faxed to [facility] [phone].  Call [facility]in Clinic with any questions [phone].

## 2020-08-25 ENCOUNTER — Other Ambulatory Visit: Payer: Self-pay

## 2020-08-25 ENCOUNTER — Ambulatory Visit (INDEPENDENT_AMBULATORY_CARE_PROVIDER_SITE_OTHER): Payer: Medicare HMO

## 2020-08-25 DIAGNOSIS — Z952 Presence of prosthetic heart valve: Secondary | ICD-10-CM

## 2020-08-25 DIAGNOSIS — Z5181 Encounter for therapeutic drug level monitoring: Secondary | ICD-10-CM | POA: Diagnosis not present

## 2020-08-25 LAB — POCT INR: INR: 2.1 (ref 2.0–3.0)

## 2020-08-25 NOTE — Patient Instructions (Signed)
continue  taking 1/2 tablet daily except 1 tablet on Mondays, Wednesdays and Saturdays. Recheck INR in 4 weeks. Order faxed to [facility] [phone].  Call Cou[facility]in Clinic with any questions [phone].

## 2020-09-02 DIAGNOSIS — Z0000 Encounter for general adult medical examination without abnormal findings: Secondary | ICD-10-CM | POA: Diagnosis not present

## 2020-09-02 DIAGNOSIS — I10 Essential (primary) hypertension: Secondary | ICD-10-CM | POA: Diagnosis not present

## 2020-09-02 DIAGNOSIS — I4891 Unspecified atrial fibrillation: Secondary | ICD-10-CM | POA: Diagnosis not present

## 2020-09-02 DIAGNOSIS — K219 Gastro-esophageal reflux disease without esophagitis: Secondary | ICD-10-CM | POA: Diagnosis not present

## 2020-09-02 DIAGNOSIS — Z1389 Encounter for screening for other disorder: Secondary | ICD-10-CM | POA: Diagnosis not present

## 2020-09-02 DIAGNOSIS — R053 Chronic cough: Secondary | ICD-10-CM | POA: Diagnosis not present

## 2020-09-02 DIAGNOSIS — Z7901 Long term (current) use of anticoagulants: Secondary | ICD-10-CM | POA: Diagnosis not present

## 2020-09-02 DIAGNOSIS — E7800 Pure hypercholesterolemia, unspecified: Secondary | ICD-10-CM | POA: Diagnosis not present

## 2020-09-02 DIAGNOSIS — M8580 Other specified disorders of bone density and structure, unspecified site: Secondary | ICD-10-CM | POA: Diagnosis not present

## 2020-09-09 DIAGNOSIS — H348322 Tributary (branch) retinal vein occlusion, left eye, stable: Secondary | ICD-10-CM | POA: Diagnosis not present

## 2020-09-09 DIAGNOSIS — H40012 Open angle with borderline findings, low risk, left eye: Secondary | ICD-10-CM | POA: Diagnosis not present

## 2020-09-09 DIAGNOSIS — H43811 Vitreous degeneration, right eye: Secondary | ICD-10-CM | POA: Diagnosis not present

## 2020-09-21 ENCOUNTER — Encounter: Payer: Self-pay | Admitting: Emergency Medicine

## 2020-09-21 ENCOUNTER — Ambulatory Visit: Payer: Medicare HMO | Admitting: Emergency Medicine

## 2020-09-21 ENCOUNTER — Other Ambulatory Visit: Payer: Self-pay

## 2020-09-21 ENCOUNTER — Ambulatory Visit (INDEPENDENT_AMBULATORY_CARE_PROVIDER_SITE_OTHER): Payer: Medicare HMO

## 2020-09-21 DIAGNOSIS — R053 Chronic cough: Secondary | ICD-10-CM

## 2020-09-21 DIAGNOSIS — I517 Cardiomegaly: Secondary | ICD-10-CM | POA: Diagnosis not present

## 2020-09-21 DIAGNOSIS — K449 Diaphragmatic hernia without obstruction or gangrene: Secondary | ICD-10-CM | POA: Diagnosis not present

## 2020-09-21 DIAGNOSIS — R059 Cough, unspecified: Secondary | ICD-10-CM | POA: Diagnosis not present

## 2020-09-21 NOTE — Assessment & Plan Note (Signed)
Has been problematic since COVID-19 in May.  She needs a repeat chest x-ray to see if there is residual from her COVID-19 pneumonia.  I suspect that this was the trigger that started her cough and that there are contributing sustain her.  I talked to her about stopping lisinopril but she is hesitant to do so as she is concerned about side effects of new medications, for control of her blood pressure.  We will continue it for now and try to control her GERD, allergic rhinitis.  Is possible that her cough may resolve with these interventions.  I will check pulmonary function testing to look for any evidence of obstructive lung disease that could be a contributor to cough.  We will repeat your CXR today We will arrange for pulmonary function testing  Continue your current allergy pill daily for now Start fluticasone nasal spray 2 sprays each nostril once a day until next visit.  Start taking protonix 40mg  once a day until next visit. Take 1 hour around food.  Depending on how your cough improves we will need to consider changing your lisinopril to an alternative blood pressure medication.  We can discuss this next time. Follow with Dr. next available with full pulmonary function testing on the same day.

## 2020-09-21 NOTE — Progress Notes (Signed)
Subjective:    Patient ID: Tracy Vargas, female    DOB: [birth year], 86 y.o.   MRN: [id number]  HPI 86 year old never smoker with a history of hypertension and HFpEF, mechanical aortic valve replacement, hyperlipidemia, osteopenia.  She had COVID-19 pneumonia in 03/2020 requiring hospital admission.  She was treated with IV steroids and remdesivir.  She had mainly GI sx initially, but also was discharged home on 2 L/min. She was able to wean herself off. Energy improved in June. She is left with a cough, often productive of clear / yellow. Can happen at any time, not always associated with meals or position. Sometimes when supine. Doesn't wake her from sleep.  She has dealt with seasonal allergies in the past but cough was not this severe. She denies any significant GERD sx, has protonix on her med list but has never taken it. She blows her nose frequently.    Review of Systems As per HPi  Past Medical History:  Diagnosis Date   Acquired hyperlipoproteinemia    Chronic diastolic heart failure (HCC) 09/01/2013   Diastolic dysfunction    Dyspnea    HTN (hypertension)    Hyperlipidemia    Osteopenia      Family History  Problem Relation Age of Onset   Heart failure Sister      Social History   Socioeconomic History   Marital status: Married    Spouse name: Not on file   Number of children: Not on file   Years of education: Not on file   Highest education level: Not on file  Occupational History   Not on file  Tobacco Use   Smoking status: Never Smoker   Smokeless tobacco: Never Used  Substance and Sexual Activity   Alcohol use: No   Drug use: No   Sexual activity: Not on file  Other Topics Concern   Not on file  Social History Narrative   Not on file   Social Determinants of Health   Financial Resource Strain:    Difficulty of Paying Living Expenses: Not on file  Food Insecurity:    Worried About Running Out of Food in the Last Year: Not  on file   Ran Out of Food in the Last Year: Not on file  Transportation Needs:    Lack of Transportation (Medical): Not on file   Lack of Transportation (Non-Medical): Not on file  Physical Activity:    Days of Exercise per Week: Not on file   Minutes of Exercise per Session: Not on file  Stress:    Feeling of Stress : Not on file  Social Connections:    Frequency of Communication with Friends and Family: Not on file   Frequency of Social Gatherings with Friends and Family: Not on file   Attends Religious Services: Not on file   Active Member of Clubs or Organizations: Not on file   Attends Banker Meetings: Not on file   Marital Status: Not on file  Intimate Partner Violence:    Fear of Current or Ex-Partner: Not on file   Emotionally Abused: Not on file   Physically Abused: Not on file   Sexually Abused: Not on file     Allergies  Allergen Reactions   Codeine     Halluciations   Sudafed [Pseudoephedrine Hcl]     Halluications     Outpatient Medications Prior to Visit  Medication Sig Dispense Refill   albuterol (VENTOLIN HFA) 108 (90 Base) MCG/ACT inhaler Inhale 2  puffs into the lungs every 6 (six) hours as needed for wheezing or shortness of breath. 6.7 g 0   alendronate (FOSAMAX) 70 MG tablet Take 70 mg by mouth once a week.     amLODipine (NORVASC) 2.5 MG tablet Take 2.5 mg by mouth daily.     amoxicillin (AMOXIL) 500 MG capsule Take 4 capsules by mouth as needed (4 capsules prior to dental appointment).      Ascorbic Acid (VITAMIN C) 1000 MG tablet Take 1,000 mg by mouth daily.     atorvastatin (LIPITOR) 10 MG tablet Take 10 mg by mouth daily at 6 PM.      b complex vitamins capsule Take 1 capsule by mouth daily.     Biotin 5000 MCG TABS Take 1 tablet by mouth 3 (three) times a week.     calcium citrate-vitamin D (CALCIUM CITRATE + D) 315-200 MG-UNIT tablet Take 2 tablets by mouth 2 (two) times daily.     Cholecalciferol  (VITAMIN D) 2000 units CAPS Take 1 capsule by mouth daily.     Docosahexaenoic Acid-EPA (OMEGA-3) 180-270 MG CAPS Take 1 capsule by mouth daily.     docusate sodium (COLACE) 250 MG capsule Take 250 mg by mouth daily.     furosemide (LASIX) 20 MG tablet Take 20 mg by mouth every 7 (seven) days. Take once a week per patient . No specific days     Glucosamine-MSM-Hyaluronic Acd 750-375-30 MG TABS Take 2 tablets by mouth 2 (two) times daily.     lisinopril (PRINIVIL,ZESTRIL) 20 MG tablet Take 20 mg by mouth daily.     Magnesium ([employer] TRIPLE MAGNESIUM COMPLEX) 400 MG CAPS Take 2 capsules by mouth daily.     pantoprazole (PROTONIX) 40 MG tablet Take 40 mg by mouth as needed (heartburn).      warfarin (JANTOVEN) 5 MG tablet TAKE AS DIRECTED BY COUMADIN CLINIC 90 tablet 1   zinc gluconate 50 MG tablet Take 50 mg by mouth daily.     No facility-administered medications prior to visit.        Objective:   Physical Exam Vitals:   09/21/20 1605  BP: 124/78  Pulse: 73  Temp: (!) 97.3 F (36.3 C)  SpO2: 99%  Weight: 157 lb 12.8 oz (71.6 kg)  Height: 5\' 1"  (1.549 m)   Gen: Pleasant, well-nourished, in no distress,  normal affect  ENT: No lesions,  mouth clear,  oropharynx clear, no postnasal drip  Neck: No JVD, no stridor  Lungs: No use of accessory muscles, no crackles or wheezing on normal respiration, no wheeze on forced expiration  Cardiovascular: RRR, mechanical S2  Musculoskeletal: No deformities, no cyanosis or clubbing  Neuro: alert, awake, non focal  Skin: Warm, no lesions or rash      Assessment & Plan:  Chronic cough Has been problematic since COVID-19 in May.  She needs a repeat chest x-ray to see if there is residual from her COVID-19 pneumonia.  I suspect that this was the trigger that started her cough and that there are contributing sustain her.  I talked to her about stopping lisinopril but she is hesitant to do so as she is concerned about side effects of  new medications, for control of her blood pressure.  We will continue it for now and try to control her GERD, allergic rhinitis.  Is possible that her cough may resolve with these interventions.  I will check pulmonary function testing to look for any evidence of obstructive lung disease  that could be a contributor to cough.  We will repeat your CXR today We will arrange for pulmonary function testing  Continue your current allergy pill daily for now Start fluticasone nasal spray 2 sprays each nostril once a day until next visit.  Start taking protonix 40mg  once a day until next visit. Take 1 hour around food.  Depending on how your cough improves we will need to consider changing your lisinopril to an alternative blood pressure medication.  We can discuss this next time. Follow with Dr. next available with full pulmonary function testing on the same day.   Delton Coombes, MD, PhD 09/21/2020, 4:50 PM [facility] [phone] or if no answer [phone]

## 2020-09-21 NOTE — Addendum Note (Signed)
Addended by: Dorisann Frames R on: 09/21/2020 04:57 PM   Modules accepted: Orders

## 2020-09-21 NOTE — Patient Instructions (Addendum)
We will repeat your CXR today We will arrange for pulmonary function testing  Continue your current allergy pill daily for now Start fluticasone nasal spray 2 sprays each nostril once a day until next visit.  Start taking protonix 40mg  once a day until next visit. Take 1 hour around food.  Depending on how your cough improves we will need to consider changing your lisinopril to an alternative blood pressure medication.  We can discuss this next time. Follow with Dr. next available with full pulmonary function testing on the same day.

## 2020-09-22 ENCOUNTER — Telehealth: Payer: Self-pay | Admitting: Emergency Medicine

## 2020-09-22 ENCOUNTER — Ambulatory Visit (INDEPENDENT_AMBULATORY_CARE_PROVIDER_SITE_OTHER): Payer: Medicare HMO

## 2020-09-22 DIAGNOSIS — Z952 Presence of prosthetic heart valve: Secondary | ICD-10-CM | POA: Diagnosis not present

## 2020-09-22 DIAGNOSIS — Z5181 Encounter for therapeutic drug level monitoring: Secondary | ICD-10-CM | POA: Diagnosis not present

## 2020-09-22 LAB — POCT INR: INR: 2.4 (ref 2.0–3.0)

## 2020-09-22 MED ORDER — PANTOPRAZOLE SODIUM 40 MG PO TBEC: 40.0000 mg | DELAYED_RELEASE_TABLET | Freq: Every day | ORAL | 5 refills | Status: DC

## 2020-09-22 MED ORDER — FLUTICASONE PROPIONATE 50 MCG/ACT NA SUSP
2.0000 | Freq: Every day | NASAL | 5 refills | Status: DC
Start: 2020-09-22 — End: 2021-09-01

## 2020-09-22 NOTE — Telephone Encounter (Signed)
Rxs have been sent in. ATC pt, there was no answer and I could not leave a message.

## 2020-09-22 NOTE — Telephone Encounter (Signed)
Patient needing Flonase and Protonix sent to [facility] on [address].

## 2020-09-22 NOTE — Patient Instructions (Signed)
continue  taking 1/2 tablet daily except 1 tablet on Mondays, Wednesdays and Saturdays. Recheck INR in 6 weeks. Order faxed to [facility] [phone].  Call Coumadin Clinic with any questions #[phone].   

## 2020-10-21 DIAGNOSIS — H40012 Open angle with borderline findings, low risk, left eye: Secondary | ICD-10-CM | POA: Diagnosis not present

## 2020-10-21 DIAGNOSIS — H43811 Vitreous degeneration, right eye: Secondary | ICD-10-CM | POA: Diagnosis not present

## 2020-10-21 DIAGNOSIS — H348322 Tributary (branch) retinal vein occlusion, left eye, stable: Secondary | ICD-10-CM | POA: Diagnosis not present

## 2020-10-21 DIAGNOSIS — H524 Presbyopia: Secondary | ICD-10-CM | POA: Diagnosis not present

## 2020-10-27 DIAGNOSIS — I4891 Unspecified atrial fibrillation: Secondary | ICD-10-CM | POA: Diagnosis not present

## 2020-10-27 DIAGNOSIS — I10 Essential (primary) hypertension: Secondary | ICD-10-CM | POA: Diagnosis not present

## 2020-10-27 DIAGNOSIS — E7800 Pure hypercholesterolemia, unspecified: Secondary | ICD-10-CM | POA: Diagnosis not present

## 2020-10-27 DIAGNOSIS — K219 Gastro-esophageal reflux disease without esophagitis: Secondary | ICD-10-CM | POA: Diagnosis not present

## 2020-10-27 DIAGNOSIS — E785 Hyperlipidemia, unspecified: Secondary | ICD-10-CM | POA: Diagnosis not present

## 2020-10-27 DIAGNOSIS — D509 Iron deficiency anemia, unspecified: Secondary | ICD-10-CM | POA: Diagnosis not present

## 2020-11-03 ENCOUNTER — Ambulatory Visit (INDEPENDENT_AMBULATORY_CARE_PROVIDER_SITE_OTHER): Payer: Medicare HMO

## 2020-11-03 DIAGNOSIS — Z5181 Encounter for therapeutic drug level monitoring: Secondary | ICD-10-CM | POA: Diagnosis not present

## 2020-11-03 DIAGNOSIS — Z952 Presence of prosthetic heart valve: Secondary | ICD-10-CM

## 2020-11-03 LAB — POCT INR: INR: 2.2 (ref 2.0–3.0)

## 2020-11-03 NOTE — Patient Instructions (Signed)
continue  taking 1/2 tablet daily except 1 tablet on Mondays, Wednesdays and Saturdays. Recheck INR in 6 weeks. Order faxed to [facility] [phone].  Call Coumadin Clinic with any questions #[phone].   

## 2020-12-01 ENCOUNTER — Ambulatory Visit: Payer: Medicare HMO | Admitting: Emergency Medicine

## 2020-12-08 DIAGNOSIS — E785 Hyperlipidemia, unspecified: Secondary | ICD-10-CM | POA: Diagnosis not present

## 2020-12-09 DIAGNOSIS — K219 Gastro-esophageal reflux disease without esophagitis: Secondary | ICD-10-CM | POA: Diagnosis not present

## 2020-12-09 DIAGNOSIS — K5900 Constipation, unspecified: Secondary | ICD-10-CM | POA: Diagnosis not present

## 2020-12-09 DIAGNOSIS — Z008 Encounter for other general examination: Secondary | ICD-10-CM | POA: Diagnosis not present

## 2020-12-09 DIAGNOSIS — Z79899 Other long term (current) drug therapy: Secondary | ICD-10-CM | POA: Diagnosis not present

## 2020-12-09 DIAGNOSIS — I10 Essential (primary) hypertension: Secondary | ICD-10-CM | POA: Diagnosis not present

## 2020-12-09 DIAGNOSIS — Z809 Family history of malignant neoplasm, unspecified: Secondary | ICD-10-CM | POA: Diagnosis not present

## 2020-12-09 DIAGNOSIS — Z7901 Long term (current) use of anticoagulants: Secondary | ICD-10-CM | POA: Diagnosis not present

## 2020-12-09 DIAGNOSIS — Z8249 Family history of ischemic heart disease and other diseases of the circulatory system: Secondary | ICD-10-CM | POA: Diagnosis not present

## 2020-12-09 DIAGNOSIS — J309 Allergic rhinitis, unspecified: Secondary | ICD-10-CM | POA: Diagnosis not present

## 2020-12-09 DIAGNOSIS — E785 Hyperlipidemia, unspecified: Secondary | ICD-10-CM | POA: Diagnosis not present

## 2020-12-09 DIAGNOSIS — G3184 Mild cognitive impairment, so stated: Secondary | ICD-10-CM | POA: Diagnosis not present

## 2020-12-15 ENCOUNTER — Other Ambulatory Visit: Payer: Self-pay

## 2020-12-15 ENCOUNTER — Ambulatory Visit (INDEPENDENT_AMBULATORY_CARE_PROVIDER_SITE_OTHER): Payer: Medicare HMO

## 2020-12-15 DIAGNOSIS — Z952 Presence of prosthetic heart valve: Secondary | ICD-10-CM

## 2020-12-15 DIAGNOSIS — Z5181 Encounter for therapeutic drug level monitoring: Secondary | ICD-10-CM | POA: Diagnosis not present

## 2020-12-15 LAB — POCT INR: INR: 2.3 (ref 2.0–3.0)

## 2020-12-15 NOTE — Patient Instructions (Signed)
continue  taking 1/2 tablet daily except 1 tablet on Mondays, Wednesdays and Saturdays. Recheck INR in 6 weeks. Order faxed to [facility] [phone].  Call Cou[facility]in Clinic with any questions [phone].

## 2020-12-17 ENCOUNTER — Other Ambulatory Visit: Payer: Self-pay | Admitting: *Deleted

## 2020-12-17 MED ORDER — WARFARIN SODIUM 5 MG PO TABS: ORAL_TABLET | ORAL | 0 refills | Status: DC

## 2020-12-18 ENCOUNTER — Other Ambulatory Visit (HOSPITAL_COMMUNITY)
Admission: RE | Admit: 2020-12-18 | Discharge: 2020-12-18 | Disposition: A | Discharge status: Home / Self Care | Payer: Medicare HMO | Source: Ambulatory Visit | Attending: Emergency Medicine | Admitting: Emergency Medicine

## 2020-12-18 ENCOUNTER — Other Ambulatory Visit: Payer: Self-pay | Admitting: Cardiology

## 2020-12-18 DIAGNOSIS — Z20822 Contact with and (suspected) exposure to covid-19: Secondary | ICD-10-CM | POA: Insufficient documentation

## 2020-12-18 DIAGNOSIS — Z01812 Encounter for preprocedural laboratory examination: Secondary | ICD-10-CM | POA: Diagnosis not present

## 2020-12-18 LAB — SARS CORONAVIRUS 2 (TAT 6-24 HRS): SARS Coronavirus 2: NEGATIVE

## 2020-12-20 ENCOUNTER — Other Ambulatory Visit: Payer: Self-pay | Admitting: Emergency Medicine

## 2020-12-20 DIAGNOSIS — R053 Chronic cough: Secondary | ICD-10-CM

## 2020-12-22 ENCOUNTER — Ambulatory Visit (INDEPENDENT_AMBULATORY_CARE_PROVIDER_SITE_OTHER): Payer: Medicare HMO | Admitting: Emergency Medicine

## 2020-12-22 ENCOUNTER — Ambulatory Visit: Payer: Medicare HMO | Admitting: Emergency Medicine

## 2020-12-22 ENCOUNTER — Encounter: Payer: Self-pay | Admitting: Emergency Medicine

## 2020-12-22 ENCOUNTER — Telehealth: Payer: Self-pay | Admitting: Cardiology

## 2020-12-22 ENCOUNTER — Other Ambulatory Visit: Payer: Self-pay

## 2020-12-22 VITALS — BP 142/68 | HR 84 | Temp 97.4°F | Ht 63.0 in | Wt 163.4 lb

## 2020-12-22 DIAGNOSIS — J849 Interstitial pulmonary disease, unspecified: Secondary | ICD-10-CM

## 2020-12-22 DIAGNOSIS — R053 Chronic cough: Secondary | ICD-10-CM

## 2020-12-22 LAB — PULMONARY FUNCTION TEST
FEF 25-75 Post: 1.63 L/sec
FEF 25-75 Pre: 1.32 L/sec
FEF2575-%Change-Post: 23 %
FEF2575-%Pred-Post: 149 %
FEF2575-%Pred-Pre: 120 %
FEV1-%Change-Post: 5 %
FEV1-%Pred-Post: 94 %
FEV1-%Pred-Pre: 89 %
FEV1-Post: 1.57 L
FEV1-Pre: 1.5 L
FEV1FVC-%Change-Post: 11 %
FEV1FVC-%Pred-Pre: 104 %
FEV6-%Change-Post: -3 %
FEV6-%Pred-Post: 87 %
FEV6-%Pred-Pre: 90 %
FEV6-Post: 1.85 L
FEV6-Pre: 1.93 L
FEV6FVC-%Change-Post: 2 %
FEV6FVC-%Pred-Post: 106 %
FEV6FVC-%Pred-Pre: 104 %
FVC-%Change-Post: -5 %
FVC-%Pred-Post: 81 %
FVC-%Pred-Pre: 86 %
FVC-Post: 1.85 L
FVC-Pre: 1.97 L
Post FEV1/FVC ratio: 85 %
Post FEV6/FVC ratio: 100 %
Pre FEV1/FVC ratio: 76 %
Pre FEV6/FVC Ratio: 98 %

## 2020-12-22 MED ORDER — AZITHROMYCIN 250 MG PO TABS: ORAL_TABLET | ORAL | 0 refills | Status: DC

## 2020-12-22 MED ORDER — VALSARTAN 160 MG PO TABS
160.0000 mg | ORAL_TABLET | Freq: Every day | ORAL | 1 refills | Status: DC
Start: 2020-12-22 — End: 2024-02-01

## 2020-12-22 NOTE — Assessment & Plan Note (Signed)
Multifactorial persistent cough has been present since spring 2021 after she had COVID.  She did not really get much response to fluticasone nasal spray or empiric pantoprazole although she does have residual GERD.  The cough is productive of yellow sputum, but is longstanding, subacute.  I think would be reasonable to treat her with azithromycin for possible bronchitis given the purulent mucus.  We will stop her lisinopril and start valsartan.  She has interstitial changes on CT chest likely reflect mild pulmonary edema.  That being said with the persistence of her cough and after Covid I think she needs a CT chest to evaluate for residual ILD.  We will arrange for this.

## 2020-12-22 NOTE — Telephone Encounter (Signed)
Called spoke with Tracy Vargas and provided him with the instructions: 5 mg (5 mg x 1) every Mon, Wed, Sat; 2.5 mg (5 mg x 0.5) all other days

## 2020-12-22 NOTE — Patient Instructions (Signed)
Please stop lisinopril Start valsartan 160 mg once a day We will arrange for CT scan of the chest Take azithromycin as directed until completely gone Follow with Dr Delton Coombes in 1 month

## 2020-12-22 NOTE — Progress Notes (Signed)
Spirometry pre and post done today. 

## 2020-12-22 NOTE — Telephone Encounter (Signed)
    Pt c/o medication issue:  1. Name of Medication:   warfarin (COUMADIN) 5 MG tablet    2. How are you currently taking this medication (dosage and times per day)? TAKE 1/2 A TABLET 1 TABLET BY MOUTH DAILY AS DIRECTED BY THE COUMADIN CLINIC.  3. Are you having a reaction (difficulty breathing--STAT)?   4. What is your medication issue? Jess Barters with [facility] calling to clarify script of pt's coumadin. They need to know if pt needs to take half tablet of a 1 tablet a day Ref# [id number]

## 2020-12-22 NOTE — Progress Notes (Signed)
Subjective:    Patient ID: Tracy Vargas, female    DOB: 05-25-1934, 87 y.o.   MRN: [id number]  HPI 87 year old never smoker with a history of hypertension and HFpEF, mechanical aortic valve replacement, hyperlipidemia, osteopenia.  She had COVID-19 pneumonia in 03/2020 requiring hospital admission.  She was treated with IV steroids and remdesivir.  She had mainly GI sx initially, but also was discharged home on 2 L/min. She was able to wean herself off. Energy improved in June. She is left with a cough, often productive of clear / yellow. Can happen at any time, not always associated with meals or position. Sometimes when supine. Doesn't wake her from sleep.  She has dealt with seasonal allergies in the past but cough was not this severe. She denies any significant GERD sx, has protonix on her med list but has never taken it. She blows her nose frequently.   ROV 12/22/20 --this is a follow-up visit in patient who is a never smoker with a history of hypertension and diastolic dysfunction, aortic valve replacement, hyperlipidemia.  She had COVID-19 in May 2021 and was left with chronic cough. Of note on lisinopril.  I asked her to start fluticasone nasal spray and empiric pantoprazole.  She reports that she used the nasal steroid, pantoprazole but without any change in her cough - but she notes that her GERD sx did persist even on the PPI. She still takes antacid occasionally.  She is coughing daily, usually productive of yellow. She has gained a bit of wt since last time. No edema.   She underwent pulmonary function testing today which I have reviewed, shows normal airflows without a bronchodilator response.  She was unable to do lung volumes or DLCO.     Review of Systems As per [facility]  Past Medical History:  Diagnosis Date  . Acquired hyperlipoproteinemia   . Chronic diastolic heart failure (HCC) 09/01/2013  . Diastolic dysfunction   . Dyspnea   . HTN (hypertension)   . Hyperlipidemia   .  Osteopenia      Family History  Problem Relation Age of Onset  . Heart failure Sister      Social History   Socioeconomic History  . Marital status: Married    Spouse name: Not on file  . Number of children: Not on file  . Years of education: Not on file  . Highest education level: Not on file  Occupational History  . Not on file  Tobacco Use  . Smoking status: Never Smoker  . Smokeless tobacco: Never Used  Substance and Sexual Activity  . Alcohol use: No  . Drug use: No  . Sexual activity: Not on file  Other Topics Concern  . Not on file  Social History Narrative  . Not on file   Social Determinants of Health   Financial Resource Strain: Not on file  Food Insecurity: Not on file  Transportation Needs: Not on file  Physical Activity: Not on file  Stress: Not on file  Social Connections: Not on file  Intimate Partner Violence: Not on file     Allergies  Allergen Reactions  . Codeine     Halluciations  . Sudafed [Pseudoephedrine Hcl]     Halluications     Outpatient Medications Prior to Visit  Medication Sig Dispense Refill  . alendronate (FOSAMAX) 70 MG tablet Take 70 mg by mouth once a week.    Marland Kitchen amLODipine (NORVASC) 2.5 MG tablet Take 2.5 mg by mouth daily.    Marland Kitchen  amoxicillin (AMOXIL) 500 MG capsule Take 4 capsules by mouth as needed (4 capsules prior to dental appointment).     . Ascorbic Acid (VITAMIN C) 1000 MG tablet Take 1,000 mg by mouth daily.    Marland Kitchen atorvastatin (LIPITOR) 10 MG tablet Take 10 mg by mouth daily at 6 PM.     . b complex vitamins capsule Take 1 capsule by mouth daily.    . Biotin 5000 MCG TABS Take 1 tablet by mouth 3 (three) times a week.    . calcium citrate-vitamin D (CITRACAL+D) 315-200 MG-UNIT tablet Take 2 tablets by mouth 2 (two) times daily.    . Cholecalciferol (VITAMIN D) 2000 units CAPS Take 1 capsule by mouth daily.    . Docosahexaenoic Acid-EPA (OMEGA-3) 180-270 MG CAPS Take 1 capsule by mouth daily.    Marland Kitchen docusate sodium  (COLACE) 250 MG capsule Take 250 mg by mouth daily.    . furosemide (LASIX) 20 MG tablet Take 20 mg by mouth every 7 (seven) days. Take once a week per patient . No specific days    . Glucosamine-MSM-Hyaluronic Acd 750-375-30 MG TABS Take 2 tablets by mouth 2 (two) times daily.    Marland Kitchen levothyroxine (SYNTHROID) 25 MCG tablet 1/2 tab taken each AM on empty stomach    . lisinopril (PRINIVIL,ZESTRIL) 20 MG tablet Take 20 mg by mouth daily.    . Magnesium 400 MG CAPS Take 2 capsules by mouth daily.    Marland Kitchen warfarin (COUMADIN) 5 MG tablet TAKE 1/2 A TABLET 1 TABLET BY MOUTH DAILY AS DIRECTED BY THE COUMADIN CLINIC. 80 tablet 0  . zinc gluconate 50 MG tablet Take 50 mg by mouth daily.    Marland Kitchen albuterol (VENTOLIN HFA) 108 (90 Base) MCG/ACT inhaler Inhale 2 puffs into the lungs every 6 (six) hours as needed for wheezing or shortness of breath. (Patient not taking: Reported on 12/22/2020) 6.7 g 0  . fluticasone (FLONASE) 50 MCG/ACT nasal spray Place 2 sprays into both nostrils daily. (Patient not taking: Reported on 12/22/2020) 16 g 5  . pantoprazole (PROTONIX) 40 MG tablet Take 1 tablet (40 mg total) by mouth daily. (Patient not taking: Reported on 12/22/2020) 30 tablet 5   No facility-administered medications prior to visit.        Objective:   Physical Exam Vitals:   12/22/20 1325  BP: (!) 142/68  Pulse: 84  Temp: (!) 97.4 F (36.3 C)  SpO2: 99%  Weight: 163 lb 6.4 oz (74.1 kg)  Height: 5\' 3"  (1.6 m)   Gen: Pleasant, well-nourished, in no distress,  normal affect  ENT: No lesions,  mouth clear,  oropharynx clear, no postnasal drip  Neck: No JVD, no stridor  Lungs: No use of accessory muscles, no crackles or wheezing on normal respiration, no wheeze on forced expiration  Cardiovascular: RRR, mechanical S2  Musculoskeletal: No deformities, no cyanosis or clubbing  Neuro: alert, awake, non focal  Skin: Warm, no lesions or rash      Assessment & Plan:  Chronic cough Multifactorial  persistent cough has been present since spring 2021 after she had COVID.  She did not really get much response to fluticasone nasal spray or empiric pantoprazole although she does have residual GERD.  The cough is productive of yellow sputum, but is longstanding, subacute.  I think would be reasonable to treat her with azithromycin for possible bronchitis given the purulent mucus.  We will stop her lisinopril and start valsartan.  She has interstitial changes on CT chest  likely reflect mild pulmonary edema.  That being said with the persistence of her cough and after Covid I think she needs a CT chest to evaluate for residual ILD.  We will arrange for this.  Levy Pupa, MD, PhD 12/22/2020, 2:02 PM [facility] [phone] or if no answer [phone]

## 2021-01-06 ENCOUNTER — Other Ambulatory Visit: Payer: Self-pay

## 2021-01-06 ENCOUNTER — Ambulatory Visit (INDEPENDENT_AMBULATORY_CARE_PROVIDER_SITE_OTHER)
Admission: RE | Admit: 2021-01-06 | Discharge: 2021-01-06 | Disposition: A | Discharge status: Home / Self Care | Payer: Medicare HMO | Source: Ambulatory Visit | Attending: Emergency Medicine | Admitting: Emergency Medicine

## 2021-01-06 DIAGNOSIS — I712 Thoracic aortic aneurysm, without rupture: Secondary | ICD-10-CM | POA: Diagnosis not present

## 2021-01-06 DIAGNOSIS — J849 Interstitial pulmonary disease, unspecified: Secondary | ICD-10-CM

## 2021-01-06 DIAGNOSIS — K449 Diaphragmatic hernia without obstruction or gangrene: Secondary | ICD-10-CM | POA: Diagnosis not present

## 2021-01-06 DIAGNOSIS — J8410 Pulmonary fibrosis, unspecified: Secondary | ICD-10-CM | POA: Diagnosis not present

## 2021-01-06 DIAGNOSIS — I728 Aneurysm of other specified arteries: Secondary | ICD-10-CM | POA: Diagnosis not present

## 2021-01-10 DIAGNOSIS — E785 Hyperlipidemia, unspecified: Secondary | ICD-10-CM | POA: Diagnosis not present

## 2021-01-10 DIAGNOSIS — D509 Iron deficiency anemia, unspecified: Secondary | ICD-10-CM | POA: Diagnosis not present

## 2021-01-10 DIAGNOSIS — I4891 Unspecified atrial fibrillation: Secondary | ICD-10-CM | POA: Diagnosis not present

## 2021-01-10 DIAGNOSIS — E7800 Pure hypercholesterolemia, unspecified: Secondary | ICD-10-CM | POA: Diagnosis not present

## 2021-01-10 DIAGNOSIS — I10 Essential (primary) hypertension: Secondary | ICD-10-CM | POA: Diagnosis not present

## 2021-01-10 DIAGNOSIS — K219 Gastro-esophageal reflux disease without esophagitis: Secondary | ICD-10-CM | POA: Diagnosis not present

## 2021-01-10 DIAGNOSIS — E039 Hypothyroidism, unspecified: Secondary | ICD-10-CM | POA: Diagnosis not present

## 2021-01-24 ENCOUNTER — Other Ambulatory Visit: Payer: Self-pay

## 2021-01-24 ENCOUNTER — Ambulatory Visit: Payer: Medicare HMO | Admitting: Emergency Medicine

## 2021-01-24 ENCOUNTER — Encounter: Payer: Self-pay | Admitting: Emergency Medicine

## 2021-01-24 DIAGNOSIS — R053 Chronic cough: Secondary | ICD-10-CM

## 2021-01-24 NOTE — Patient Instructions (Addendum)
We will plan to continue valsartan 160 mg daily.  Agree with talking Dr. Katrinka Blazing so that he can get a 70-month supply through the mail. Your CT scan of the chest was reassuring.  There is no scarring left over from your COVID-19 last year.  This is good news. Try starting loratadine 10 mg once daily.  This will decrease mucus and hopefully decrease throat irritation and cough. If you continue to have cough you could retry fluticasone nasal spray, 2 sprays each nostril once daily. It may be difficult for you to continue scheduled stomach acid medications since it gave you abdominal pain.  You may be able to tolerate Pepcid 20 mg once daily if you want to try this in the future. Please follow Dr. Delton Coombes if needed for any changes in your coughing or your breathing.

## 2021-01-24 NOTE — Assessment & Plan Note (Signed)
Her CT scan of the chest was reassuring, no ILD.  She does have a hiatal hernia with GERD.  She did not tolerate PPI due to abdominal pain and imagine that persistent reflux is at least a contributor to her upper airway irritation and cough.  She may get partial benefit from going back on rhinitis therapy including loratadine or possibly restart fluticasone nasal spray.  Discussed this with her today.  She does not have any evidence for obstructive disease on spirometry.  She benefited from the change to valsartan from lisinopril and we will continue this.  She wants to have a 21-month supply and is going to talk to Dr. Katrinka Blazing about this.  She can follow with me as needed  We will plan to continue valsartan 160 mg daily.  Agree with talking Dr. Katrinka Blazing so that he can get a 57-month supply through the mail. Your CT scan of the chest was reassuring.  There is no scarring left over from your COVID-19 last year.  This is good news. Try starting loratadine 10 mg once daily.  This will decrease mucus and hopefully decrease throat irritation and cough. If you continue to have cough you could retry fluticasone nasal spray, 2 sprays each nostril once daily. It may be difficult for you to continue scheduled stomach acid medications since it gave you abdominal pain.  You may be able to tolerate Pepcid 20 mg once daily if you want to try this in the future. Please follow Dr. Delton Coombes if needed for any changes in your coughing or your breathing.

## 2021-01-24 NOTE — Progress Notes (Signed)
Subjective:    Patient ID: Tracy Vargas, female    DOB: [birth year], 87 y.o.   MRN: [id number]  HPI  ROV 01/24/21 --87 year old never smoker who follows up today for chronic cough that has been most bothersome since she had COVID-19 and 03/2020.  She also has a history of hypertension, AVR, hyperlipidemia, diastolic dysfunction.  She has GERD that did not fully respond to PPI, did a trial of nasal steroid, she stopped it because she wasn't sure it helped.  At her last visit a month ago I changed her lisinopril to valsartan.  Her spirometry is overall normal.  She returns today reporting that her cough is improved since the lisinopril was changed.  She is experiencing residual cough but less. Has some yellow mucous often. Intermittently can lose her voice. She is off PPI because it gave her abd pain.   CT scan of the chest performed 01/07/2021 reviewed by me, shows no evidence of interstitial lung disease, there may be some mild air trapping, small calcified granulomas without bronchiectasis or other distortion.  She has splenic arterial aneurysms and some calcified dense papillary left ventricular muscle.  Also noted was a large hiatal hernia.   Review of Systems As per [facility]     Objective:   Physical Exam Vitals:   01/24/21 1147  BP: 116/74  Pulse: 70  Temp: (!) 97.5 F (36.4 C)  TempSrc: Temporal  SpO2: 98%  Weight: 159 lb 12.8 oz (72.5 kg)  Height: 5\' 1"  (1.549 m)   Gen: Pleasant, well-nourished, in no distress,  normal affect  ENT: No lesions,  mouth clear,  oropharynx clear, no postnasal drip  Neck: No JVD, no stridor  Lungs: No use of accessory muscles, no crackles or wheezing on normal respiration, no wheeze on forced expiration  Cardiovascular: RRR, mechanical S2  Musculoskeletal: No deformities, no cyanosis or clubbing  Neuro: alert, awake, non focal  Skin: Warm, no lesions or rash      Assessment & Plan:  Chronic cough Her CT scan of the chest was reassuring,  no ILD.  She does have a hiatal hernia with GERD.  She did not tolerate PPI due to abdominal pain and imagine that persistent reflux is at least a contributor to her upper airway irritation and cough.  She may get partial benefit from going back on rhinitis therapy including loratadine or possibly restart fluticasone nasal spray.  Discussed this with her today.  She does not have any evidence for obstructive disease on spirometry.  She benefited from the change to valsartan from lisinopril and we will continue this.  She wants to have a 41-month supply and is going to talk to Dr. 2-month about this.  She can follow with me as needed  We will plan to continue valsartan 160 mg daily.  Agree with talking Dr. Katrinka Blazing so that he can get a 37-month supply through the mail. Your CT scan of the chest was reassuring.  There is no scarring left over from your COVID-19 last year.  This is good news. Try starting loratadine 10 mg once daily.  This will decrease mucus and hopefully decrease throat irritation and cough. If you continue to have cough you could retry fluticasone nasal spray, 2 sprays each nostril once daily. It may be difficult for you to continue scheduled stomach acid medications since it gave you abdominal pain.  You may be able to tolerate Pepcid 20 mg once daily if you want to try this in the future.  Please follow Dr. Delton Coombes if needed for any changes in your coughing or your breathing.  Levy Pupa, MD, PhD 01/24/2021, 12:14 PM [facility] [phone] or if no answer [phone]

## 2021-01-26 ENCOUNTER — Ambulatory Visit (INDEPENDENT_AMBULATORY_CARE_PROVIDER_SITE_OTHER): Payer: Medicare HMO | Admitting: Pharmacist

## 2021-01-26 ENCOUNTER — Other Ambulatory Visit: Payer: Self-pay

## 2021-01-26 DIAGNOSIS — Z952 Presence of prosthetic heart valve: Secondary | ICD-10-CM

## 2021-01-26 DIAGNOSIS — Z5181 Encounter for therapeutic drug level monitoring: Secondary | ICD-10-CM | POA: Diagnosis not present

## 2021-01-26 LAB — POCT INR: INR: 1.8 — AB (ref 2.0–3.0)

## 2021-02-08 DIAGNOSIS — R946 Abnormal results of thyroid function studies: Secondary | ICD-10-CM | POA: Diagnosis not present

## 2021-02-09 ENCOUNTER — Ambulatory Visit: Payer: Medicare HMO | Admitting: Cardiology

## 2021-02-09 ENCOUNTER — Other Ambulatory Visit: Payer: Self-pay

## 2021-02-09 ENCOUNTER — Ambulatory Visit (INDEPENDENT_AMBULATORY_CARE_PROVIDER_SITE_OTHER): Payer: Medicare HMO | Admitting: *Deleted

## 2021-02-09 ENCOUNTER — Encounter: Payer: Self-pay | Admitting: Cardiology

## 2021-02-09 VITALS — BP 140/70 | HR 64 | Ht 61.0 in | Wt 160.0 lb

## 2021-02-09 DIAGNOSIS — I10 Essential (primary) hypertension: Secondary | ICD-10-CM | POA: Diagnosis not present

## 2021-02-09 DIAGNOSIS — Z5181 Encounter for therapeutic drug level monitoring: Secondary | ICD-10-CM

## 2021-02-09 DIAGNOSIS — Z952 Presence of prosthetic heart valve: Secondary | ICD-10-CM

## 2021-02-09 DIAGNOSIS — I5032 Chronic diastolic (congestive) heart failure: Secondary | ICD-10-CM | POA: Diagnosis not present

## 2021-02-09 LAB — POCT INR: INR: 2.3 (ref 2.0–3.0)

## 2021-02-09 MED ORDER — FUROSEMIDE 20 MG PO TABS: 20.0000 mg | ORAL_TABLET | ORAL | 3 refills | Status: DC

## 2021-02-09 NOTE — Patient Instructions (Signed)
Medication Instructions:  The current medical regimen is effective;  continue present plan and medications.  *If you need a refill on your cardiac medications before your next appointment, please call your pharmacy*  Follow-Up: At [facility], you and your health needs are our priority.  As part of our continuing mission to provide you with exceptional heart care, we have created designated Provider Care Teams.  These Care Teams include your primary Cardiologist (physician) and Advanced Practice Providers (APPs -  Physician Assistants and Nurse Practitioners) who all work together to provide you with the care you need, when you need it.  We recommend signing up for the patient portal called "MyChart".  Sign up information is provided on this After Visit Summary.  MyChart is used to connect with patients for Virtual Visits (Telemedicine).  Patients are able to view lab/test results, encounter notes, upcoming appointments, etc.  Non-urgent messages can be sent to your provider as well.   To learn more about what you can do with MyChart, go to [email or web address].    Your next appointment:   12 month(s)  The format for your next appointment:   In Person  Provider:   Mark Skains, MD   Thank you for choosing [facility]!!      

## 2021-02-09 NOTE — Patient Instructions (Addendum)
Description   Continue  taking 1/2 tablet daily except 1 tablet on Mondays, Wednesdays and Saturdays. Recheck INR in 4 weeks.Call Coumadin Clinic with any questions [phone].

## 2021-02-09 NOTE — Progress Notes (Signed)
Cardiology Office Note:    Date:  02/09/2021   ID:  Tracy Vargas, DOB 1934/02/05, MRN [id number]  PCP:  Merri Brunette, MD   [facility]  Cardiologist:  Donato Schultz, MD  Advanced Practice Provider:  No care team member to display Electrophysiologist:  None       Referring MD: Merri Brunette, MD     History of Present Illness:    Tracy Vargas is a 87 y.o. female here for follow-up mechanical aortic valve.  Her husband had TAVR valve placed by Dr. Excell Seltzer.  She feels well.  She is still inviting guests over to her house, baking, staying active.  No chest pain fevers chills nausea vomiting syncope bleeding.  She did have Covid and a prolonged cough following with yellow sputum production.  At one point, one of her antihypertensives was stopped and this seemed to help the cough.  Past Medical History:  Diagnosis Date  . Acquired hyperlipoproteinemia   . Chronic diastolic heart failure (HCC) 09/01/2013  . Diastolic dysfunction   . Dyspnea   . HTN (hypertension)   . Hyperlipidemia   . Osteopenia     Past Surgical History:  Procedure Laterality Date  . APPENDECTOMY    . CARDIAC VALVE REPLACEMENT    . CATARACT EXTRACTION    . ESOPHAGOGASTRODUODENOSCOPY N/A 07/14/2016   Procedure: ESOPHAGOGASTRODUODENOSCOPY (EGD);  Surgeon: Dorena Cookey, MD;  Location: [facility] ENDOSCOPY;  Service: Endoscopy;  Laterality: N/A;  . ROTATOR CUFF REPAIR    . TONSILLECTOMY      Current Medications: Current Meds  Medication Sig  . amLODipine (NORVASC) 2.5 MG tablet Take 2.5 mg by mouth daily.  Marland Kitchen amoxicillin (AMOXIL) 500 MG capsule Take 4 capsules by mouth as needed (4 capsules prior to dental appointment).   . Ascorbic Acid (VITAMIN C) 1000 MG tablet Take 1,000 mg by mouth daily.  Marland Kitchen atorvastatin (LIPITOR) 10 MG tablet Take 10 mg by mouth daily at 6 PM.   . b complex vitamins capsule Take 1 capsule by mouth daily.  . Biotin 5000 MCG TABS Take 1 tablet by mouth 3 (three)  times a week.  . calcium citrate-vitamin D (CITRACAL+D) 315-200 MG-UNIT tablet Take 2 tablets by mouth 2 (two) times daily.  . Cholecalciferol (VITAMIN D) 2000 units CAPS Take 1 capsule by mouth daily.  . Docosahexaenoic Acid-EPA (OMEGA-3) 180-270 MG CAPS Take 1 capsule by mouth daily.  Marland Kitchen docusate sodium (COLACE) 250 MG capsule Take 250 mg by mouth daily.  . fluticasone (FLONASE) 50 MCG/ACT nasal spray Place 2 sprays into both nostrils daily.  . furosemide (LASIX) 20 MG tablet Take 1 tablet (20 mg total) by mouth every 7 (seven) days. Take once a week per patient . No specific days  . Glucosamine-MSM-Hyaluronic Acd 750-375-30 MG TABS Take 2 tablets by mouth 2 (two) times daily.  Marland Kitchen levothyroxine (SYNTHROID) 25 MCG tablet 1/2 tab taken each AM on empty stomach  . Magnesium 400 MG CAPS Take 2 capsules by mouth daily.  . pantoprazole (PROTONIX) 40 MG tablet Take 1 tablet (40 mg total) by mouth daily.  . valsartan (DIOVAN) 160 MG tablet Take 1 tablet (160 mg total) by mouth daily.  Marland Kitchen warfarin (COUMADIN) 5 MG tablet TAKE 1/2 A TABLET 1 TABLET BY MOUTH DAILY AS DIRECTED BY THE COUMADIN CLINIC.  Marland Kitchen zinc gluconate 50 MG tablet Take 50 mg by mouth daily.  . [DISCONTINUED] furosemide (LASIX) 20 MG tablet Take 20 mg by mouth every 7 (seven) days.  Take once a week per patient . No specific days     Allergies:   Codeine and Sudafed [pseudoephedrine hcl]   Social History   Socioeconomic History  . Marital status: Married    Spouse name: Not on file  . Number of children: Not on file  . Years of education: Not on file  . Highest education level: Not on file  Occupational History  . Not on file  Tobacco Use  . Smoking status: Never Smoker  . Smokeless tobacco: Never Used  Substance and Sexual Activity  . Alcohol use: No  . Drug use: No  . Sexual activity: Not on file  Other Topics Concern  . Not on file  Social History Narrative  . Not on file   Social Determinants of Health   Financial  Resource Strain: Not on file  Food Insecurity: Not on file  Transportation Needs: Not on file  Physical Activity: Not on file  Stress: Not on file  Social Connections: Not on file     Family History: The patient's family history includes Heart failure in her sister.  ROS:   Please see the history of present illness.     All other systems reviewed and are negative.  EKGs/Labs/Other Studies Reviewed:    The following studies were reviewed today:  Echocardiogram 02/12/2020:  1. Left ventricular ejection fraction, by estimation, is 60 to 65%. The  left ventricle has normal function. The left ventricle has no regional  wall motion abnormalities. There is moderate left ventricular hypertrophy.  Left ventricular diastolic  parameters are consistent with Grade II diastolic dysfunction  (pseudonormalization).  2. Right ventricular systolic function is normal. The right ventricular  size is normal. The estimated right ventricular systolic pressure is 32.8  mmHg.  3. Left atrial size was moderately dilated.  4. Right atrial size was mildly dilated.  5. The mitral valve is normal in structure. Moderate mitral valve  regurgitation. No evidence of mitral stenosis.  6. Tricuspid valve regurgitation is mild to moderate.  7. Mechanical aortic valve with mean gradient 13 mmHg, no significant  stenosis. Aortic valve regurgitation is trivial.  8. The inferior vena cava is normal in size with greater than 50%  respiratory variability, suggesting right atrial pressure of 3 mmHg.   EKG:  EKG is  ordered today.  The ekg ordered today demonstrates SR 64 with PAC  Recent Labs: 04/05/2020: ALT 26; B Natriuretic Peptide 916.7; BUN 27; Creatinine, Ser 0.91; Hemoglobin 10.6; Magnesium 2.2; Platelets 326; Potassium 4.7; Sodium 136  Recent Lipid Panel    Component Value Date/Time   TRIG 105 04/01/2020 1431     Risk Assessment/Calculations:      Physical Exam:    VS:  BP 140/70 (BP  Location: Left Arm, Patient Position: Sitting, Cuff Size: Normal)   Pulse 64   Ht 5\' 1"  (1.549 m)   Wt 160 lb (72.6 kg)   SpO2 96%   BMI 30.23 kg/m     Wt Readings from Last 3 Encounters:  02/09/21 160 lb (72.6 kg)  01/24/21 159 lb 12.8 oz (72.5 kg)  12/22/20 163 lb 6.4 oz (74.1 kg)     GEN:  Well nourished, well developed in no acute distress HEENT: Normal NECK: No JVD; No carotid bruits LYMPHATICS: No lymphadenopathy CARDIAC: RRR, no murmurs, rubs, gallops RESPIRATORY:  Clear to auscultation without rales, wheezing or rhonchi  ABDOMEN: Soft, non-tender, non-distended MUSCULOSKELETAL:  No edema; No deformity  SKIN: Warm and dry NEUROLOGIC:  Alert  and oriented x 3 PSYCHIATRIC:  Normal affect   ASSESSMENT:    1. Chronic diastolic heart failure (HCC)   2. H/O mechanical aortic valve replacement   3. Essential hypertension    PLAN:    In order of problems listed above:  Mechanical aortic valve -Coumadin, chronic anticoagulation, valve is functioning normally.  2 out of 6 systolic murmur heard on exam with sharp diastolic click.  Continuing to maintain her Coumadin well.  Chronic diastolic heart failure -Low-dose Lasix helps with her chronic shortness of breath in the past.  Trying to stay active salt restriction weight loss.  When she takes her Lasix she loses about 2 to 3 pounds.  She likes to try to take this once a week.  We are refilling her furosemide.  Hyperlipidemia-last LDL 83. -Overall been doing quite well with dietary modification and atorvastatin.  No myalgias.  Mild carotid artery bilaterally. -Left eye visual change previously.  Essential hypertension -Mildly elevated today.  To stop one of her medications after COVID prolonged cough.  She said that this helped with her cough as well, likely losartan or lisinopril.  Continue to monitor.  Medication Adjustments/Labs and Tests Ordered: Current medicines are reviewed at length with the patient today.   Concerns regarding medicines are outlined above.  Orders Placed This Encounter  Procedures  . EKG 12-Lead   Meds ordered this encounter  Medications  . furosemide (LASIX) 20 MG tablet    Sig: Take 1 tablet (20 mg total) by mouth every 7 (seven) days. Take once a week per patient . No specific days    Dispense:  30 tablet    Refill:  3    Patient Instructions  Medication Instructions:  The current medical regimen is effective;  continue present plan and medications.  *If you need a refill on your cardiac medications before your next appointment, please call your pharmacy*  Follow-Up: At [facility], you and your health needs are our priority.  As part of our continuing mission to provide you with exceptional heart care, we have created designated Provider Care Teams.  These Care Teams include your primary Cardiologist (physician) and Advanced Practice Providers (APPs -  Physician Assistants and Nurse Practitioners) who all work together to provide you with the care you need, when you need it.  We recommend signing up for the patient portal called "MyChart".  Sign up information is provided on this After Visit Summary.  MyChart is used to connect with patients for Virtual Visits (Telemedicine).  Patients are able to view lab/test results, encounter notes, upcoming appointments, etc.  Non-urgent messages can be sent to your provider as well.   To learn more about what you can do with MyChart, go to [email or web address].    Your next appointment:   12 month(s)  The format for your next appointment:   In Person  Provider:   Donato Schultz, MD   Thank you for choosing [facility]!!        Signed, Donato Schultz, MD  02/09/2021 11:12 AM    [facility]

## 2021-03-02 DIAGNOSIS — Z952 Presence of prosthetic heart valve: Secondary | ICD-10-CM | POA: Diagnosis not present

## 2021-03-02 DIAGNOSIS — E7800 Pure hypercholesterolemia, unspecified: Secondary | ICD-10-CM | POA: Diagnosis not present

## 2021-03-02 DIAGNOSIS — Z7901 Long term (current) use of anticoagulants: Secondary | ICD-10-CM | POA: Diagnosis not present

## 2021-03-02 DIAGNOSIS — E039 Hypothyroidism, unspecified: Secondary | ICD-10-CM | POA: Diagnosis not present

## 2021-03-02 DIAGNOSIS — I10 Essential (primary) hypertension: Secondary | ICD-10-CM | POA: Diagnosis not present

## 2021-03-09 ENCOUNTER — Ambulatory Visit (INDEPENDENT_AMBULATORY_CARE_PROVIDER_SITE_OTHER): Payer: Medicare HMO

## 2021-03-09 ENCOUNTER — Other Ambulatory Visit: Payer: Self-pay

## 2021-03-09 DIAGNOSIS — Z5181 Encounter for therapeutic drug level monitoring: Secondary | ICD-10-CM

## 2021-03-09 DIAGNOSIS — Z952 Presence of prosthetic heart valve: Secondary | ICD-10-CM | POA: Diagnosis not present

## 2021-03-09 LAB — POCT INR: INR: 1.8 — AB (ref 2.0–3.0)

## 2021-03-09 NOTE — Patient Instructions (Signed)
Take an additional 0.5 tablet today only and then Continue  taking 1/2 tablet daily except 1 tablet on Mondays, Wednesdays and Saturdays. Recheck INR in 4 weeks.Call Coumadin Clinic with any questions [phone].

## 2021-04-15 ENCOUNTER — Other Ambulatory Visit: Payer: Self-pay

## 2021-04-15 ENCOUNTER — Ambulatory Visit (INDEPENDENT_AMBULATORY_CARE_PROVIDER_SITE_OTHER): Payer: Medicare HMO

## 2021-04-15 DIAGNOSIS — Z5181 Encounter for therapeutic drug level monitoring: Secondary | ICD-10-CM

## 2021-04-15 DIAGNOSIS — Z952 Presence of prosthetic heart valve: Secondary | ICD-10-CM

## 2021-04-15 LAB — POCT INR: INR: 2.1 (ref 2.0–3.0)

## 2021-04-15 NOTE — Patient Instructions (Addendum)
    Description   Continue  taking 1/2 tablet daily except 1 tablet on Mondays, Wednesdays and Saturdays. Recheck INR in 3 weeks.Call Coumadin Clinic with any questions [phone].

## 2021-05-02 DIAGNOSIS — E039 Hypothyroidism, unspecified: Secondary | ICD-10-CM | POA: Diagnosis not present

## 2021-05-02 DIAGNOSIS — I10 Essential (primary) hypertension: Secondary | ICD-10-CM | POA: Diagnosis not present

## 2021-05-02 DIAGNOSIS — E7800 Pure hypercholesterolemia, unspecified: Secondary | ICD-10-CM | POA: Diagnosis not present

## 2021-05-02 DIAGNOSIS — I4891 Unspecified atrial fibrillation: Secondary | ICD-10-CM | POA: Diagnosis not present

## 2021-05-02 DIAGNOSIS — K219 Gastro-esophageal reflux disease without esophagitis: Secondary | ICD-10-CM | POA: Diagnosis not present

## 2021-05-02 DIAGNOSIS — E785 Hyperlipidemia, unspecified: Secondary | ICD-10-CM | POA: Diagnosis not present

## 2021-05-02 DIAGNOSIS — D509 Iron deficiency anemia, unspecified: Secondary | ICD-10-CM | POA: Diagnosis not present

## 2021-05-04 DIAGNOSIS — E039 Hypothyroidism, unspecified: Secondary | ICD-10-CM | POA: Diagnosis not present

## 2021-05-04 DIAGNOSIS — I10 Essential (primary) hypertension: Secondary | ICD-10-CM | POA: Diagnosis not present

## 2021-05-06 ENCOUNTER — Other Ambulatory Visit: Payer: Self-pay

## 2021-05-06 ENCOUNTER — Ambulatory Visit (INDEPENDENT_AMBULATORY_CARE_PROVIDER_SITE_OTHER): Payer: Medicare HMO

## 2021-05-06 DIAGNOSIS — Z5181 Encounter for therapeutic drug level monitoring: Secondary | ICD-10-CM | POA: Diagnosis not present

## 2021-05-06 DIAGNOSIS — Z952 Presence of prosthetic heart valve: Secondary | ICD-10-CM

## 2021-05-06 LAB — POCT INR: INR: 1.6 — AB (ref 2.0–3.0)

## 2021-05-06 NOTE — Patient Instructions (Signed)
Take 1.5 tablets tonight only and then Continue  taking 1/2 tablet daily except 1 tablet on Mondays, Wednesdays and Saturdays. Recheck INR in 3 weeks.Call Coumadin Clinic with any questions [phone].

## 2021-05-11 ENCOUNTER — Other Ambulatory Visit: Payer: Self-pay | Admitting: Cardiology

## 2021-05-27 ENCOUNTER — Ambulatory Visit (INDEPENDENT_AMBULATORY_CARE_PROVIDER_SITE_OTHER): Payer: Medicare HMO

## 2021-05-27 ENCOUNTER — Other Ambulatory Visit: Payer: Self-pay

## 2021-05-27 DIAGNOSIS — Z952 Presence of prosthetic heart valve: Secondary | ICD-10-CM

## 2021-05-27 DIAGNOSIS — Z5181 Encounter for therapeutic drug level monitoring: Secondary | ICD-10-CM

## 2021-05-27 LAB — POCT INR: INR: 1.7 — AB (ref 2.0–3.0)

## 2021-05-27 NOTE — Patient Instructions (Signed)
Take 0.5 tablet tonight only and then increase to 1 tablet daily except 0.5 tablet on Tuesday and Thursday . Recheck INR in 2 weeks. Call Coumadin Clinic with any questions [phone].

## 2021-05-30 DIAGNOSIS — H43811 Vitreous degeneration, right eye: Secondary | ICD-10-CM | POA: Diagnosis not present

## 2021-05-30 DIAGNOSIS — H40012 Open angle with borderline findings, low risk, left eye: Secondary | ICD-10-CM | POA: Diagnosis not present

## 2021-05-30 DIAGNOSIS — H16143 Punctate keratitis, bilateral: Secondary | ICD-10-CM | POA: Diagnosis not present

## 2021-05-30 DIAGNOSIS — H348322 Tributary (branch) retinal vein occlusion, left eye, stable: Secondary | ICD-10-CM | POA: Diagnosis not present

## 2021-06-10 ENCOUNTER — Ambulatory Visit (INDEPENDENT_AMBULATORY_CARE_PROVIDER_SITE_OTHER): Payer: Medicare HMO

## 2021-06-10 ENCOUNTER — Other Ambulatory Visit: Payer: Self-pay

## 2021-06-10 DIAGNOSIS — Z952 Presence of prosthetic heart valve: Secondary | ICD-10-CM | POA: Diagnosis not present

## 2021-06-10 DIAGNOSIS — Z5181 Encounter for therapeutic drug level monitoring: Secondary | ICD-10-CM | POA: Diagnosis not present

## 2021-06-10 LAB — POCT INR: INR: 2.3 (ref 2.0–3.0)

## 2021-06-10 NOTE — Patient Instructions (Signed)
Continue taking 1 tablet daily except 0.5 tablet on Tuesday and Thursday . Recheck INR in 6 weeks. Call Coumadin Clinic with any questions [phone].

## 2021-06-13 DIAGNOSIS — H43811 Vitreous degeneration, right eye: Secondary | ICD-10-CM | POA: Diagnosis not present

## 2021-06-13 DIAGNOSIS — H40012 Open angle with borderline findings, low risk, left eye: Secondary | ICD-10-CM | POA: Diagnosis not present

## 2021-06-13 DIAGNOSIS — H348322 Tributary (branch) retinal vein occlusion, left eye, stable: Secondary | ICD-10-CM | POA: Diagnosis not present

## 2021-06-13 DIAGNOSIS — H16143 Punctate keratitis, bilateral: Secondary | ICD-10-CM | POA: Diagnosis not present

## 2021-06-27 DIAGNOSIS — N3946 Mixed incontinence: Secondary | ICD-10-CM | POA: Diagnosis not present

## 2021-06-29 DIAGNOSIS — E039 Hypothyroidism, unspecified: Secondary | ICD-10-CM | POA: Diagnosis not present

## 2021-06-29 DIAGNOSIS — D509 Iron deficiency anemia, unspecified: Secondary | ICD-10-CM | POA: Diagnosis not present

## 2021-06-29 DIAGNOSIS — I4891 Unspecified atrial fibrillation: Secondary | ICD-10-CM | POA: Diagnosis not present

## 2021-06-29 DIAGNOSIS — I10 Essential (primary) hypertension: Secondary | ICD-10-CM | POA: Diagnosis not present

## 2021-06-29 DIAGNOSIS — I5032 Chronic diastolic (congestive) heart failure: Secondary | ICD-10-CM | POA: Diagnosis not present

## 2021-06-29 DIAGNOSIS — E785 Hyperlipidemia, unspecified: Secondary | ICD-10-CM | POA: Diagnosis not present

## 2021-06-29 DIAGNOSIS — K219 Gastro-esophageal reflux disease without esophagitis: Secondary | ICD-10-CM | POA: Diagnosis not present

## 2021-06-29 DIAGNOSIS — E7800 Pure hypercholesterolemia, unspecified: Secondary | ICD-10-CM | POA: Diagnosis not present

## 2021-07-22 ENCOUNTER — Other Ambulatory Visit: Payer: Self-pay

## 2021-07-22 ENCOUNTER — Ambulatory Visit: Payer: Medicare HMO

## 2021-07-22 DIAGNOSIS — Z5181 Encounter for therapeutic drug level monitoring: Secondary | ICD-10-CM | POA: Diagnosis not present

## 2021-07-22 DIAGNOSIS — Z952 Presence of prosthetic heart valve: Secondary | ICD-10-CM

## 2021-07-22 LAB — POCT INR: INR: 1.9 — AB (ref 2.0–3.0)

## 2021-07-22 NOTE — Patient Instructions (Signed)
Take 1.5 tablets today only and then Continue taking 1 tablet daily except 0.5 tablet on Tuesday and Thursday . Recheck INR in 6 weeks. Call Coumadin Clinic with any questions [phone].

## 2021-08-08 ENCOUNTER — Telehealth: Payer: Self-pay | Admitting: Cardiology

## 2021-08-08 NOTE — Telephone Encounter (Signed)
ATTEMPTED TO CALL TRIAGE [phone] TWICE WITH NO ANSWER.

## 2021-08-08 NOTE — Telephone Encounter (Signed)
Patient c/o Palpitations:  High priority if patient c/o lightheadedness, shortness of breath, or chest pain  How long have you had palpitations/irregular HR/ Afib? Are you having the symptoms now? SINCE LAST Thursday NIGHT  08/04/21  Are you currently experiencing lightheadedness, SOB or CP? LIGHTHEADEDNESS , TINY BIT OF CHEST DISCOMFORT, PT IS HAVING TO HOLD ON TO THINGS AS SHE TRIES TO WALK  Do you have a history of afib (atrial fibrillation) or irregular heart rhythm? NO  Have you checked your BP or HR? (document readings if available): NO  Are you experiencing any other symptoms? TIREDNESS NO ENERGY    PT HAD HEART VALVE REPLACEMENT 04/1998

## 2021-08-08 NOTE — Telephone Encounter (Signed)
Spoke with patient who is reporting she feels like her heart is "hitting together all the time."  She reports never having this feeling before.  It seems alittle better when sitting up but is still constant.  Pt reports BP 128/87 HR 133 and on recheck was 134/84 HR 119.  Advised she needs to be seen sooner rather than later and would be a good idea to go the ED for evaluation and treatment.  She reports she is managed throughout the weekend like this and would prefer not to go to the ED.  Appt scheduled with Dr Anne Fu 10/4 at 2:20 pm at [facility] location.  Advised to avoid any type of stimulants such as caffeine in diet and any over the counter medications.  Advised if s/s worsen she will need to go to ED.  She states understanding.

## 2021-08-08 NOTE — Telephone Encounter (Signed)
Returned call to Pt.  Per Pt starting last Thursday she noted that her heart felt like it was pounding and irregular.  States it feels like "the sides are banging together".  This rhythm wakes her up from sleep.  She states "I don't feel bad, but I do not feel good".  She states when she tries to complete a task she is tired afterwards, which is new.  Pt is currently on warfarin for mechanical heart valve.  Per Pt she does not have a history of afib.  Will discuss with Dr. Anne Fu and call Pt back with further recommendations.

## 2021-08-09 ENCOUNTER — Encounter (HOSPITAL_COMMUNITY): Payer: Self-pay | Admitting: Cardiology

## 2021-08-09 ENCOUNTER — Encounter (HOSPITAL_BASED_OUTPATIENT_CLINIC_OR_DEPARTMENT_OTHER): Payer: Self-pay | Admitting: *Deleted

## 2021-08-09 ENCOUNTER — Ambulatory Visit (HOSPITAL_BASED_OUTPATIENT_CLINIC_OR_DEPARTMENT_OTHER): Payer: Medicare HMO | Admitting: Cardiology

## 2021-08-09 ENCOUNTER — Ambulatory Visit: Payer: Medicare HMO | Admitting: *Deleted

## 2021-08-09 ENCOUNTER — Other Ambulatory Visit: Payer: Self-pay

## 2021-08-09 DIAGNOSIS — Z7901 Long term (current) use of anticoagulants: Secondary | ICD-10-CM | POA: Diagnosis not present

## 2021-08-09 DIAGNOSIS — E7800 Pure hypercholesterolemia, unspecified: Secondary | ICD-10-CM

## 2021-08-09 DIAGNOSIS — Z952 Presence of prosthetic heart valve: Secondary | ICD-10-CM

## 2021-08-09 DIAGNOSIS — I5189 Other ill-defined heart diseases: Secondary | ICD-10-CM | POA: Diagnosis not present

## 2021-08-09 DIAGNOSIS — I482 Chronic atrial fibrillation, unspecified: Secondary | ICD-10-CM | POA: Insufficient documentation

## 2021-08-09 DIAGNOSIS — Z5181 Encounter for therapeutic drug level monitoring: Secondary | ICD-10-CM | POA: Diagnosis not present

## 2021-08-09 DIAGNOSIS — I480 Paroxysmal atrial fibrillation: Secondary | ICD-10-CM

## 2021-08-09 LAB — POCT INR: INR: 2.4 (ref 2.0–3.0)

## 2021-08-09 NOTE — Patient Instructions (Signed)
Description   Continue taking 1 tablet daily except 1/2 tablet on Tuesday and Thursday .Please call us when cardioversion is scheduled. Pending cardioversion. We would like to check INR 1 week post cardioversion.  Call Coumadin Clinic with any questions [phone].

## 2021-08-09 NOTE — Assessment & Plan Note (Signed)
Atrial fibrillation newly discovered.  Thankfully she is on Coumadin.  Her INR in mid September was 1.9 close to 2.0.  We will go ahead and recheck it again.  If therapeutic, I am comfortable with her proceeding with cardioversion. Continue to try to avoid long atrial fibrillation with rapid ventricular response which could result in cardiomyopathy.

## 2021-08-09 NOTE — Patient Instructions (Addendum)
Medication Instructions:  The current medical regimen is effective;  continue present plan and medications.  *If you need a refill on your cardiac medications before your next appointment, please call your pharmacy*  Lab Work: Please have your INR repeated at the [facility] office. If you have labs (blood work) drawn today and your tests are completely normal, you will receive your results only by: MyChart Message (if you have MyChart) OR A paper copy in the mail If you have any lab test that is abnormal or we need to change your treatment, we will call you to review the results.  Testing/Procedures: Your physician has requested that you have a Cardioversion.  Electrical Cardioversion uses a jolt of electricity to your heart either through paddles or wired patches attached to your chest. This is a controlled, usually prescheduled, procedure. This procedure is done at the hospital and you are not awake during the procedure. You usually go home the day of the procedure. Please see the instruction sheet given to you today for more information.  Follow-Up: At [facility], you and your health needs are our priority.  As part of our continuing mission to provide you with exceptional heart care, we have created designated Provider Care Teams.  These Care Teams include your primary Cardiologist (physician) and Advanced Practice Providers (APPs -  Physician Assistants and Nurse Practitioners) who all work together to provide you with the care you need, when you need it.  We recommend signing up for the patient portal called "MyChart".  Sign up information is provided on this After Visit Summary.  MyChart is used to connect with patients for Virtual Visits (Telemedicine).  Patients are able to view lab/test results, encounter notes, upcoming appointments, etc.  Non-urgent messages can be sent to your provider as well.   To learn more about what you can do with MyChart, go to [email or web address].     Your next appointment:   1 month(s)  The format for your next appointment:   In Person  Provider:   You will follow up in the Atrial Fibrillation Clinic located at [facility]. Your provider will be: Rudi Coco, NP or Clint R. Fenton, PA-C   Thank you for choosing [facility]!!   AFIB CLINIC INFORMATION: Your appointment is scheduled on:    at    . Please arrive 15 minutes early for check-in. The AFib Clinic is located in the Heart and Vascular Specialty Clinics at [facility]. Parking instructions/directions: Government social research officer C ([address]). When you pull in to Entrance C, there is an underground parking garage to your right. The code to enter the garage is 3333. Take the elevators to the first floor. Follow the signs to the Heart and Vascular Specialty Clinics. You will see registration at the end of the hallway.  Phone number: [phone]

## 2021-08-09 NOTE — H&P (View-Only) (Signed)
Cardiology Office Note:    Date:  08/09/2021   ID:  Tracy Vargas, DOB [birth year], MRN [id number]  PCP:  Merri Brunette, MD   [facility]  Cardiologist:  Donato Schultz, MD  Advanced Practice Provider:  No care team member to display Electrophysiologist:  None       Referring MD: Merri Brunette, MD    History of Present Illness:    Tracy Vargas is a 87 y.o. female here for the follow-up of irregular pounding heart beats and fatigue.  Has a mechanical aortic valve.  Her husband had TAVR valve placed by Dr. Excell Seltzer.  At her last appointment she felt well.  She is still inviting guests over to her house, baking, staying active.  No chest pain fevers chills nausea vomiting syncope bleeding.  She did have Covid and a prolonged cough following with yellow sputum production.  At one point, one of her antihypertensives was stopped and this seemed to help the cough.  Today: She is accompanied by a family member. Overall she is feeling "okay but yucky" and does not feel like herself. Intermittently she is feeling her palpitations.  Lately she has been feeling a little woozy. She is being careful to hold on to things so she won't fall.   She has been under a lot of stress as she is the primary caretaker for her husband and has had financial issues.  She denies any chest pain, or shortness of breath. No headaches, syncope, orthopnea, or PND. Also has no lower extremity edema.  Since 1999 she has been on Coumadin.  Past Medical History:  Diagnosis Date   Acquired hyperlipoproteinemia    Chronic diastolic heart failure (HCC) 09/01/2013   Diastolic dysfunction    Dyspnea    HTN (hypertension)    Hyperlipidemia    Osteopenia     Past Surgical History:  Procedure Laterality Date   APPENDECTOMY     CARDIAC VALVE REPLACEMENT     CATARACT EXTRACTION     ESOPHAGOGASTRODUODENOSCOPY N/A 07/14/2016   Procedure: ESOPHAGOGASTRODUODENOSCOPY (EGD);  Surgeon: Dorena Cookey, MD;  Location: [facility] ENDOSCOPY;  Service: Endoscopy;  Laterality: N/A;   ROTATOR CUFF REPAIR     TONSILLECTOMY      Current Medications: Current Meds  Medication Sig   amLODipine (NORVASC) 2.5 MG tablet Take 2.5 mg by mouth daily.   amoxicillin (AMOXIL) 500 MG capsule Take 4 capsules by mouth as needed (4 capsules prior to dental appointment).    Ascorbic Acid (VITAMIN C) 1000 MG tablet Take 1,000 mg by mouth daily.   atorvastatin (LIPITOR) 10 MG tablet Take 10 mg by mouth daily at 6 PM.    b complex vitamins capsule Take 1 capsule by mouth daily.   Biotin 5000 MCG TABS Take 1 tablet by mouth 3 (three) times a week.   calcium citrate-vitamin D (CITRACAL+D) 315-200 MG-UNIT tablet Take 2 tablets by mouth 2 (two) times daily.   Cholecalciferol (VITAMIN D) 2000 units CAPS Take 1 capsule by mouth daily.   Docosahexaenoic Acid-EPA (OMEGA-3) 180-270 MG CAPS Take 1 capsule by mouth daily.   docusate sodium (COLACE) 250 MG capsule Take 250 mg by mouth daily.   fluticasone (FLONASE) 50 MCG/ACT nasal spray Place 2 sprays into both nostrils daily.   furosemide (LASIX) 20 MG tablet Take 1 tablet (20 mg total) by mouth every 7 (seven) days. Take once a week per patient . No specific days   Glucosamine-MSM-Hyaluronic Acd 750-375-30 MG TABS Take 2  tablets by mouth 2 (two) times daily.   levothyroxine (SYNTHROID) 25 MCG tablet 1/2 tab taken each AM on empty stomach   Magnesium 400 MG CAPS Take 2 capsules by mouth daily.   pantoprazole (PROTONIX) 40 MG tablet Take 1 tablet (40 mg total) by mouth daily.   valsartan (DIOVAN) 160 MG tablet Take 1 tablet (160 mg total) by mouth daily.   warfarin (JANTOVEN) 5 MG tablet TAKE 1 TABLET ON MONDAY,   WEDNESDAY AND SATURDAY AND 1/2 TABLET ALL OTHER DAYS   zinc gluconate 50 MG tablet Take 50 mg by mouth daily.     Allergies:   Codeine and Sudafed [pseudoephedrine hcl]   Social History   Socioeconomic History   Marital status: Married    Spouse name: Not on  file   Number of children: Not on file   Years of education: Not on file   Highest education level: Not on file  Occupational History   Not on file  Tobacco Use   Smoking status: Never   Smokeless tobacco: Never  Substance and Sexual Activity   Alcohol use: No   Drug use: No   Sexual activity: Not on file  Other Topics Concern   Not on file  Social History Narrative   Not on file   Social Determinants of Health   Financial Resource Strain: Not on file  Food Insecurity: Not on file  Transportation Needs: Not on file  Physical Activity: Not on file  Stress: Not on file  Social Connections: Not on file     Family History: The patient's family history includes Heart failure in her sister.  ROS:   Please see the history of present illness.    (+) Lightheadedness (+) Stress All other systems reviewed and are negative.  EKGs/Labs/Other Studies Reviewed:    The following studies were reviewed today:  CT Chest 01/06/2021: FINDINGS: Cardiovascular: Atherosclerotic calcification of the aorta with an aortic valve replacement. Dense papillary muscle calcification in the left ventricle. Heart is enlarged. No pericardial effusion.   Mediastinum/Nodes: No pathologically enlarged mediastinal or axillary lymph nodes. Hilar regions are difficult to definitively evaluate without IV contrast. Esophagus is grossly unremarkable. Large hiatal hernia.   Lungs/Pleura: Calcified granulomas. Other pulmonary nodules measure 3 mm or less in size. Negative for subpleural reticulation, traction bronchiectasis/bronchiolectasis, ground-glass, architectural distortion or honeycombing. No pleural fluid. Airway is unremarkable. There is air trapping.   Upper Abdomen: Visualized portions of the liver, gallbladder, adrenal glands, kidneys, spleen, pancreas, stomach and bowel are unremarkable with the exception of a large hiatal hernia. There are 4 splenic artery aneurysms, measuring up to 1.5 cm.  No upper abdominal adenopathy.   Musculoskeletal: Degenerative changes in the spine. No worrisome lytic or sclerotic lesions.   IMPRESSION: 1. No evidence of interstitial lung disease or post COVID-19 inflammatory fibrosis. 2. Air trapping is indicative of small airways disease. 3. Dense papillary muscle calcification in the left ventricle. 4. Multiple splenic artery aneurysms. 5. Large hiatal hernia. 6.  Aortic atherosclerosis (ICD10-I70.0).  Echocardiogram 02/12/2020:  1. Left ventricular ejection fraction, by estimation, is 60 to 65%. The  left ventricle has normal function. The left ventricle has no regional  wall motion abnormalities. There is moderate left ventricular hypertrophy.  Left ventricular diastolic  parameters are consistent with Grade II diastolic dysfunction  (pseudonormalization).   2. Right ventricular systolic function is normal. The right ventricular  size is normal. The estimated right ventricular systolic pressure is 32.8  mmHg.   3. Left   atrial size was moderately dilated.   4. Right atrial size was mildly dilated.   5. The mitral valve is normal in structure. Moderate mitral valve  regurgitation. No evidence of mitral stenosis.   6. Tricuspid valve regurgitation is mild to moderate.   7. Mechanical aortic valve with mean gradient 13 mmHg, no significant  stenosis. Aortic valve regurgitation is trivial.   8. The inferior vena cava is normal in size with greater than 50%  respiratory variability, suggesting right atrial pressure of 3 mmHg.   EKG:  EKG is personally reviewed and interpreted. 08/09/2021: Atrial fibrillation. Rate 135 bpm. 02/09/2021: SR 64 with PAC  Recent Labs: No results found for requested labs within last 8760 hours.   Recent Lipid Panel    Component Value Date/Time   TRIG 105 04/01/2020 1431    Risk Assessment/Calculations:      Physical Exam:    VS:  BP (!) 160/82   Pulse (!) 135   Ht 5\' 1"  (1.549 m)   Wt 158 lb 4.8 oz  (71.8 kg)   BMI 29.91 kg/m     Wt Readings from Last 3 Encounters:  08/09/21 158 lb 4.8 oz (71.8 kg)  02/09/21 160 lb (72.6 kg)  01/24/21 159 lb 12.8 oz (72.5 kg)     GEN: Well nourished, well developed in no acute distress HEENT: Normal NECK: No JVD; No carotid bruits LYMPHATICS: No lymphadenopathy CARDIAC: Tachycardic, Irregular, Sharp S2 click RESPIRATORY:  Clear to auscultation without rales, wheezing or rhonchi  ABDOMEN: Soft, non-tender, non-distended MUSCULOSKELETAL:  No edema; No deformity  SKIN: Warm and dry NEUROLOGIC:  Alert and oriented x 3 PSYCHIATRIC:  Normal affect   ASSESSMENT:    1. Paroxysmal atrial fibrillation (HCC)   2. Chronic anticoagulation   3. Pure hypercholesterolemia   4. H/O mechanical aortic valve replacement   5. Diastolic dysfunction     PLAN:    In order of problems listed above: Paroxysmal atrial fibrillation (HCC) Atrial fibrillation newly discovered.  Thankfully she is on Coumadin.  Her INR in mid September was 1.9 close to 2.0.  We will go ahead and recheck it again.  If therapeutic, I am comfortable with her proceeding with cardioversion. Continue to try to avoid long atrial fibrillation with rapid ventricular response which could result in cardiomyopathy.  Chronic anticoagulation On Coumadin.  Goal INR 2-2.5 based upon her prior GI bleeding.  Hyperlipidemia Prior LDL 83.  Continue with atorvastatin 10 mg once a day.  H/O mechanical aortic valve replacement Replaced 1999.  On Coumadin.  Diastolic dysfunction Overall doing well.  Concerned about atrial fibrillation however.  Prior creatinine 0.8 hemoglobin 12.4 potassium 5.1 TSH 4.3 from outside labs May 04, 2021.  Follow-up: 1 month afib clinic.  Medication Adjustments/Labs and Tests Ordered: Current medicines are reviewed at length with the patient today.  Concerns regarding medicines are outlined above.   Orders Placed This Encounter  Procedures   EKG 12-Lead    No  orders of the defined types were placed in this encounter.  Patient Instructions  Medication Instructions:  The current medical regimen is effective;  continue present plan and medications.  *If you need a refill on your cardiac medications before your next appointment, please call your pharmacy*  Lab Work: Please have your INR repeated at the [facility] office. If you have labs (blood work) drawn today and your tests are completely normal, you will receive your results only by: MyChart Message (if you have MyChart) OR A paper  copy in the mail If you have any lab test that is abnormal or we need to change your treatment, we will call you to review the results.  Testing/Procedures: Your physician has requested that you have a Cardioversion.  Electrical Cardioversion uses a jolt of electricity to your heart either through paddles or wired patches attached to your chest. This is a controlled, usually prescheduled, procedure. This procedure is done at the hospital and you are not awake during the procedure. You usually go home the day of the procedure. Please see the instruction sheet given to you today for more information.  Follow-Up: At [facility], you and your health needs are our priority.  As part of our continuing mission to provide you with exceptional heart care, we have created designated Provider Care Teams.  These Care Teams include your primary Cardiologist (physician) and Advanced Practice Providers (APPs -  Physician Assistants and Nurse Practitioners) who all work together to provide you with the care you need, when you need it.  We recommend signing up for the patient portal called "MyChart".  Sign up information is provided on this After Visit Summary.  MyChart is used to connect with patients for Virtual Visits (Telemedicine).  Patients are able to view lab/test results, encounter notes, upcoming appointments, etc.  Non-urgent messages can be sent to your provider as well.    To learn more about what you can do with MyChart, go to [email or web address].    Your next appointment:   1 month(s)  The format for your next appointment:   In Person  Provider:   You will follow up in the Atrial Fibrillation Clinic located at [facility]. Your provider will be: Rudi Coco, NP or Clint R. Fenton, PA-C   Thank you for choosing [facility]!!   AFIB CLINIC INFORMATION: Your appointment is scheduled on:    at    . Please arrive 15 minutes early for check-in. The AFib Clinic is located in the Heart and Vascular Specialty Clinics at [facility] Parking instructions/directions: Government social research officer C ([address]). When you pull in to Entrance C, there is an underground parking garage to your right. The code to enter the garage is 3333. Take the elevators to the first floor. Follow the signs to the Heart and Vascular Specialty Clinics. You will see registration at the end of the hallway.  Phone number: [phone]    I,Mathew Stumpf,acting as a scribe for Donato Schultz, MD.,have documented all relevant documentation on the behalf of Donato Schultz, MD,as directed by  Donato Schultz, MD while in the presence of Donato Schultz, MD.  I, Donato Schultz, MD, have reviewed all documentation for this visit. The documentation on 08/09/21 for the exam, diagnosis, procedures, and orders are all accurate and complete.   Signed, Donato Schultz, MD  08/09/2021 4:07 PM    [facility]

## 2021-08-09 NOTE — Progress Notes (Signed)
Cardiology Office Note:    Date:  08/09/2021   ID:  Tracy Vargas, DOB [birth year], MRN [id number]  PCP:  Merri Brunette, MD   [facility]  Cardiologist:  Donato Schultz, MD  Advanced Practice Provider:  No care team member to display Electrophysiologist:  None       Referring MD: Merri Brunette, MD    History of Present Illness:    Tracy Vargas is a 87 y.o. female here for the follow-up of irregular pounding heart beats and fatigue.  Has a mechanical aortic valve.  Her husband had TAVR valve placed by Dr. Excell Seltzer.  At her last appointment she felt well.  She is still inviting guests over to her house, baking, staying active.  No chest pain fevers chills nausea vomiting syncope bleeding.  She did have Covid and a prolonged cough following with yellow sputum production.  At one point, one of her antihypertensives was stopped and this seemed to help the cough.  Today: She is accompanied by a family member. Overall she is feeling "okay but yucky" and does not feel like herself. Intermittently she is feeling her palpitations.  Lately she has been feeling a little woozy. She is being careful to hold on to things so she won't fall.   She has been under a lot of stress as she is the primary caretaker for her husband and has had financial issues.  She denies any chest pain, or shortness of breath. No headaches, syncope, orthopnea, or PND. Also has no lower extremity edema.  Since 1999 she has been on Coumadin.  Past Medical History:  Diagnosis Date   Acquired hyperlipoproteinemia    Chronic diastolic heart failure (HCC) 09/01/2013   Diastolic dysfunction    Dyspnea    HTN (hypertension)    Hyperlipidemia    Osteopenia     Past Surgical History:  Procedure Laterality Date   APPENDECTOMY     CARDIAC VALVE REPLACEMENT     CATARACT EXTRACTION     ESOPHAGOGASTRODUODENOSCOPY N/A 07/14/2016   Procedure: ESOPHAGOGASTRODUODENOSCOPY (EGD);  Surgeon: Dorena Cookey, MD;  Location: [facility] ENDOSCOPY;  Service: Endoscopy;  Laterality: N/A;   ROTATOR CUFF REPAIR     TONSILLECTOMY      Current Medications: Current Meds  Medication Sig   amLODipine (NORVASC) 2.5 MG tablet Take 2.5 mg by mouth daily.   amoxicillin (AMOXIL) 500 MG capsule Take 4 capsules by mouth as needed (4 capsules prior to dental appointment).    Ascorbic Acid (VITAMIN C) 1000 MG tablet Take 1,000 mg by mouth daily.   atorvastatin (LIPITOR) 10 MG tablet Take 10 mg by mouth daily at 6 PM.    b complex vitamins capsule Take 1 capsule by mouth daily.   Biotin 5000 MCG TABS Take 1 tablet by mouth 3 (three) times a week.   calcium citrate-vitamin D (CITRACAL+D) 315-200 MG-UNIT tablet Take 2 tablets by mouth 2 (two) times daily.   Cholecalciferol (VITAMIN D) 2000 units CAPS Take 1 capsule by mouth daily.   Docosahexaenoic Acid-EPA (OMEGA-3) 180-270 MG CAPS Take 1 capsule by mouth daily.   docusate sodium (COLACE) 250 MG capsule Take 250 mg by mouth daily.   fluticasone (FLONASE) 50 MCG/ACT nasal spray Place 2 sprays into both nostrils daily.   furosemide (LASIX) 20 MG tablet Take 1 tablet (20 mg total) by mouth every 7 (seven) days. Take once a week per patient . No specific days   Glucosamine-MSM-Hyaluronic Acd 750-375-30 MG TABS Take 2  tablets by mouth 2 (two) times daily.   levothyroxine (SYNTHROID) 25 MCG tablet 1/2 tab taken each AM on empty stomach   Magnesium 400 MG CAPS Take 2 capsules by mouth daily.   pantoprazole (PROTONIX) 40 MG tablet Take 1 tablet (40 mg total) by mouth daily.   valsartan (DIOVAN) 160 MG tablet Take 1 tablet (160 mg total) by mouth daily.   warfarin (JANTOVEN) 5 MG tablet TAKE 1 TABLET ON MONDAY,   WEDNESDAY AND SATURDAY AND 1/2 TABLET ALL OTHER DAYS   zinc gluconate 50 MG tablet Take 50 mg by mouth daily.     Allergies:   Codeine and Sudafed [pseudoephedrine hcl]   Social History   Socioeconomic History   Marital status: Married    Spouse name: Not on  file   Number of children: Not on file   Years of education: Not on file   Highest education level: Not on file  Occupational History   Not on file  Tobacco Use   Smoking status: Never   Smokeless tobacco: Never  Substance and Sexual Activity   Alcohol use: No   Drug use: No   Sexual activity: Not on file  Other Topics Concern   Not on file  Social History Narrative   Not on file   Social Determinants of Health   Financial Resource Strain: Not on file  Food Insecurity: Not on file  Transportation Needs: Not on file  Physical Activity: Not on file  Stress: Not on file  Social Connections: Not on file     Family History: The patient's family history includes Heart failure in her sister.  ROS:   Please see the history of present illness.    (+) Lightheadedness (+) Stress All other systems reviewed and are negative.  EKGs/Labs/Other Studies Reviewed:    The following studies were reviewed today:  CT Chest 01/06/2021: FINDINGS: Cardiovascular: Atherosclerotic calcification of the aorta with an aortic valve replacement. Dense papillary muscle calcification in the left ventricle. Heart is enlarged. No pericardial effusion.   Mediastinum/Nodes: No pathologically enlarged mediastinal or axillary lymph nodes. Hilar regions are difficult to definitively evaluate without IV contrast. Esophagus is grossly unremarkable. Large hiatal hernia.   Lungs/Pleura: Calcified granulomas. Other pulmonary nodules measure 3 mm or less in size. Negative for subpleural reticulation, traction bronchiectasis/bronchiolectasis, ground-glass, architectural distortion or honeycombing. No pleural fluid. Airway is unremarkable. There is air trapping.   Upper Abdomen: Visualized portions of the liver, gallbladder, adrenal glands, kidneys, spleen, pancreas, stomach and bowel are unremarkable with the exception of a large hiatal hernia. There are 4 splenic artery aneurysms, measuring up to 1.5 cm.  No upper abdominal adenopathy.   Musculoskeletal: Degenerative changes in the spine. No worrisome lytic or sclerotic lesions.   IMPRESSION: 1. No evidence of interstitial lung disease or post COVID-19 inflammatory fibrosis. 2. Air trapping is indicative of small airways disease. 3. Dense papillary muscle calcification in the left ventricle. 4. Multiple splenic artery aneurysms. 5. Large hiatal hernia. 6.  Aortic atherosclerosis (ICD10-I70.0).  Echocardiogram 02/12/2020:  1. Left ventricular ejection fraction, by estimation, is 60 to 65%. The  left ventricle has normal function. The left ventricle has no regional  wall motion abnormalities. There is moderate left ventricular hypertrophy.  Left ventricular diastolic  parameters are consistent with Grade II diastolic dysfunction  (pseudonormalization).   2. Right ventricular systolic function is normal. The right ventricular  size is normal. The estimated right ventricular systolic pressure is 32.8  mmHg.   3. Left  atrial size was moderately dilated.   4. Right atrial size was mildly dilated.   5. The mitral valve is normal in structure. Moderate mitral valve  regurgitation. No evidence of mitral stenosis.   6. Tricuspid valve regurgitation is mild to moderate.   7. Mechanical aortic valve with mean gradient 13 mmHg, no significant  stenosis. Aortic valve regurgitation is trivial.   8. The inferior vena cava is normal in size with greater than 50%  respiratory variability, suggesting right atrial pressure of 3 mmHg.   EKG:  EKG is personally reviewed and interpreted. 08/09/2021: Atrial fibrillation. Rate 135 bpm. 02/09/2021: SR 64 with PAC  Recent Labs: No results found for requested labs within last 8760 hours.   Recent Lipid Panel    Component Value Date/Time   TRIG 105 04/01/2020 1431    Risk Assessment/Calculations:      Physical Exam:    VS:  BP (!) 160/82   Pulse (!) 135   Ht 5\' 1"  (1.549 m)   Wt 158 lb 4.8 oz  (71.8 kg)   BMI 29.91 kg/m     Wt Readings from Last 3 Encounters:  08/09/21 158 lb 4.8 oz (71.8 kg)  02/09/21 160 lb (72.6 kg)  01/24/21 159 lb 12.8 oz (72.5 kg)     GEN: Well nourished, well developed in no acute distress HEENT: Normal NECK: No JVD; No carotid bruits LYMPHATICS: No lymphadenopathy CARDIAC: Tachycardic, Irregular, Sharp S2 click RESPIRATORY:  Clear to auscultation without rales, wheezing or rhonchi  ABDOMEN: Soft, non-tender, non-distended MUSCULOSKELETAL:  No edema; No deformity  SKIN: Warm and dry NEUROLOGIC:  Alert and oriented x 3 PSYCHIATRIC:  Normal affect   ASSESSMENT:    1. Paroxysmal atrial fibrillation (HCC)   2. Chronic anticoagulation   3. Pure hypercholesterolemia   4. H/O mechanical aortic valve replacement   5. Diastolic dysfunction     PLAN:    In order of problems listed above: Paroxysmal atrial fibrillation (HCC) Atrial fibrillation newly discovered.  Thankfully she is on Coumadin.  Her INR in mid September was 1.9 close to 2.0.  We will go ahead and recheck it again.  If therapeutic, I am comfortable with her proceeding with cardioversion. Continue to try to avoid long atrial fibrillation with rapid ventricular response which could result in cardiomyopathy.  Chronic anticoagulation On Coumadin.  Goal INR 2-2.5 based upon her prior GI bleeding.  Hyperlipidemia Prior LDL 83.  Continue with atorvastatin 10 mg once a day.  H/O mechanical aortic valve replacement Replaced 1999.  On Coumadin.  Diastolic dysfunction Overall doing well.  Concerned about atrial fibrillation however.  Prior creatinine 0.8 hemoglobin 12.4 potassium 5.1 TSH 4.3 from outside labs May 04, 2021.  Follow-up: 1 month afib clinic.  Medication Adjustments/Labs and Tests Ordered: Current medicines are reviewed at length with the patient today.  Concerns regarding medicines are outlined above.   Orders Placed This Encounter  Procedures   EKG 12-Lead    No  orders of the defined types were placed in this encounter.  Patient Instructions  Medication Instructions:  The current medical regimen is effective;  continue present plan and medications.  *If you need a refill on your cardiac medications before your next appointment, please call your pharmacy*  Lab Work: Please have your INR repeated at the [facility] office. If you have labs (blood work) drawn today and your tests are completely normal, you will receive your results only by: MyChart Message (if you have MyChart) OR A paper  copy in the mail If you have any lab test that is abnormal or we need to change your treatment, we will call you to review the results.  Testing/Procedures: Your physician has requested that you have a Cardioversion.  Electrical Cardioversion uses a jolt of electricity to your heart either through paddles or wired patches attached to your chest. This is a controlled, usually prescheduled, procedure. This procedure is done at the hospital and you are not awake during the procedure. You usually go home the day of the procedure. Please see the instruction sheet given to you today for more information.  Follow-Up: At [facility], you and your health needs are our priority.  As part of our continuing mission to provide you with exceptional heart care, we have created designated Provider Care Teams.  These Care Teams include your primary Cardiologist (physician) and Advanced Practice Providers (APPs -  Physician Assistants and Nurse Practitioners) who all work together to provide you with the care you need, when you need it.  We recommend signing up for the patient portal called "MyChart".  Sign up information is provided on this After Visit Summary.  MyChart is used to connect with patients for Virtual Visits (Telemedicine).  Patients are able to view lab/test results, encounter notes, upcoming appointments, etc.  Non-urgent messages can be sent to your provider as well.    To learn more about what you can do with MyChart, go to [email or web address].    Your next appointment:   1 month(s)  The format for your next appointment:   In Person  Provider:   You will follow up in the Atrial Fibrillation Clinic located at [facility] Your provider will be: Rudi Coco, NP or Clint R. Fenton, PA-C   Thank you for choosing [facility]!!   AFIB CLINIC INFORMATION: Your appointment is scheduled on:    at    . Please arrive 15 minutes early for check-in. The AFib Clinic is located in the Heart and Vascular Specialty Clinics at [facility] Parking instructions/directions: Government social research officer C ([address]). When you pull in to Entrance C, there is an underground parking garage to your right. The code to enter the garage is 3333. Take the elevators to the first floor. Follow the signs to the Heart and Vascular Specialty Clinics. You will see registration at the end of the hallway.  Phone number: [phone]    I,Mathew Stumpf,acting as a scribe for Donato Schultz, MD.,have documented all relevant documentation on the behalf of Donato Schultz, MD,as directed by  Donato Schultz, MD while in the presence of Donato Schultz, MD.  I, Donato Schultz, MD, have reviewed all documentation for this visit. The documentation on 08/09/21 for the exam, diagnosis, procedures, and orders are all accurate and complete.   Signed, Donato Schultz, MD  08/09/2021 4:07 PM    [facility]

## 2021-08-09 NOTE — Assessment & Plan Note (Signed)
On Coumadin.  Goal INR 2-2.5 based upon her prior GI bleeding.

## 2021-08-09 NOTE — Assessment & Plan Note (Signed)
Overall doing well.  Concerned about atrial fibrillation however.  Prior creatinine 0.8 hemoglobin 12.4 potassium 5.1 TSH 4.3 from outside labs May 04, 2021.

## 2021-08-09 NOTE — Assessment & Plan Note (Signed)
Prior LDL 83.  Continue with atorvastatin 10 mg once a day.

## 2021-08-09 NOTE — Assessment & Plan Note (Signed)
Replaced 1999.  On Coumadin.

## 2021-08-10 ENCOUNTER — Other Ambulatory Visit: Payer: Self-pay | Admitting: Cardiology

## 2021-08-10 DIAGNOSIS — I480 Paroxysmal atrial fibrillation: Secondary | ICD-10-CM

## 2021-08-15 ENCOUNTER — Ambulatory Visit: Payer: Medicare HMO

## 2021-08-15 ENCOUNTER — Other Ambulatory Visit: Payer: Self-pay

## 2021-08-15 DIAGNOSIS — Z5181 Encounter for therapeutic drug level monitoring: Secondary | ICD-10-CM

## 2021-08-15 DIAGNOSIS — Z952 Presence of prosthetic heart valve: Secondary | ICD-10-CM | POA: Diagnosis not present

## 2021-08-15 LAB — POCT INR: INR: 1.8 — AB (ref 2.0–3.0)

## 2021-08-15 NOTE — Patient Instructions (Signed)
Take another 0.5 tablet today and then Continue taking 1 tablet daily except 1/2 tablet on Tuesday and Thursday .  We would like to check INR 1 week post cardioversion.  Call Coumadin Clinic with any questions [phone].

## 2021-08-16 ENCOUNTER — Encounter (HOSPITAL_COMMUNITY): Payer: Self-pay | Admitting: Cardiology

## 2021-08-16 ENCOUNTER — Ambulatory Visit (HOSPITAL_COMMUNITY)
Admission: RE | Admit: 2021-08-16 | Discharge: 2021-08-16 | Disposition: A | Discharge status: Home / Self Care | Payer: Medicare HMO | Source: Ambulatory Visit | Attending: Cardiology | Admitting: Cardiology

## 2021-08-16 ENCOUNTER — Ambulatory Visit (HOSPITAL_COMMUNITY): Payer: Medicare HMO | Admitting: Anesthesiology

## 2021-08-16 ENCOUNTER — Other Ambulatory Visit: Payer: Self-pay

## 2021-08-16 ENCOUNTER — Encounter (HOSPITAL_COMMUNITY)
Admission: RE | Disposition: A | Discharge status: Home / Self Care | Payer: Self-pay | Source: Ambulatory Visit | Attending: Cardiology

## 2021-08-16 ENCOUNTER — Ambulatory Visit (INDEPENDENT_AMBULATORY_CARE_PROVIDER_SITE_OTHER): Payer: Medicare HMO | Admitting: Pharmacist

## 2021-08-16 DIAGNOSIS — R0609 Other forms of dyspnea: Secondary | ICD-10-CM | POA: Diagnosis not present

## 2021-08-16 DIAGNOSIS — Z5181 Encounter for therapeutic drug level monitoring: Secondary | ICD-10-CM

## 2021-08-16 DIAGNOSIS — Z7901 Long term (current) use of anticoagulants: Secondary | ICD-10-CM | POA: Insufficient documentation

## 2021-08-16 DIAGNOSIS — R002 Palpitations: Secondary | ICD-10-CM | POA: Insufficient documentation

## 2021-08-16 DIAGNOSIS — Z5309 Procedure and treatment not carried out because of other contraindication: Secondary | ICD-10-CM | POA: Insufficient documentation

## 2021-08-16 DIAGNOSIS — Z952 Presence of prosthetic heart valve: Secondary | ICD-10-CM | POA: Diagnosis not present

## 2021-08-16 DIAGNOSIS — I480 Paroxysmal atrial fibrillation: Secondary | ICD-10-CM

## 2021-08-16 LAB — POCT I-STAT, CHEM 8
BUN: 19 mg/dL (ref 8–23)
Calcium, Ion: 1.14 mmol/L — ABNORMAL LOW (ref 1.15–1.40)
Chloride: 108 mmol/L (ref 98–111)
Creatinine, Ser: 0.8 mg/dL (ref 0.44–1.00)
Glucose, Bld: 100 mg/dL — ABNORMAL HIGH (ref 70–99)
HCT: 37 % (ref 36.0–46.0)
Hemoglobin: 12.6 g/dL (ref 12.0–15.0)
Potassium: 4.3 mmol/L (ref 3.5–5.1)
Sodium: 140 mmol/L (ref 135–145)
TCO2: 22 mmol/L (ref 22–32)

## 2021-08-16 LAB — PROTIME-INR
INR: 1.7 — ABNORMAL HIGH (ref 0.8–1.2)
Prothrombin Time: 19.7 seconds — ABNORMAL HIGH (ref 11.4–15.2)

## 2021-08-16 SURGERY — CANCELLED PROCEDURE
Anesthesia: General

## 2021-08-16 NOTE — Anesthesia Preprocedure Evaluation (Deleted)
Anesthesia Evaluation  Patient identified by MRN, date of birth, ID band Patient awake    Reviewed: Allergy & Precautions, NPO status , Patient's Chart, lab work & pertinent test results  Airway        Dental   Pulmonary neg pulmonary ROS,           Cardiovascular hypertension, Pt. on medications +CHF  + dysrhythmias (on coumadin) Atrial Fibrillation + Valvular Problems/Murmurs (s/p AVR) MR   TTE 2021 1. Left ventricular ejection fraction, by estimation, is 60 to 65%. The  left ventricle has normal function. The left ventricle has no regional  wall motion abnormalities. There is moderate left ventricular hypertrophy.  Left ventricular diastolic  parameters are consistent with Grade II diastolic dysfunction  (pseudonormalization).  2. Right ventricular systolic function is normal. The right ventricular  size is normal. The estimated right ventricular systolic pressure is 32.8  mmHg.  3. Left atrial size was moderately dilated.  4. Right atrial size was mildly dilated.  5. The mitral valve is normal in structure. Moderate mitral valve  regurgitation. No evidence of mitral stenosis.  6. Tricuspid valve regurgitation is mild to moderate.  7. Mechanical aortic valve with mean gradient 13 mmHg, no significant  stenosis. Aortic valve regurgitation is trivial.  8. The inferior vena cava is normal in size with greater than 50%  respiratory variability, suggesting right atrial pressure of 3 mmHg.     Neuro/Psych negative neurological ROS  negative psych ROS   GI/Hepatic Neg liver ROS, GERD  Medicated and Controlled,  Endo/Other  Hypothyroidism   Renal/GU negative Renal ROS  negative genitourinary   Musculoskeletal  (+) Arthritis ,   Abdominal   Peds  Hematology negative hematology ROS (+)   Anesthesia Other Findings   Reproductive/Obstetrics                             Anesthesia  Physical Anesthesia Plan  ASA: 3  Anesthesia Plan: General   Post-op Pain Management:    Induction: Intravenous  PONV Risk Score and Plan: 3 and Propofol infusion  Airway Management Planned: Natural Airway  Additional Equipment:   Intra-op Plan:   Post-operative Plan: Extubation in OR  Informed Consent: I have reviewed the patients History and Physical, chart, labs and discussed the procedure including the risks, benefits and alternatives for the proposed anesthesia with the patient or authorized representative who has indicated his/her understanding and acceptance.     Dental advisory given  Plan Discussed with: CRNA  Anesthesia Plan Comments:         Anesthesia Quick Evaluation

## 2021-08-16 NOTE — Patient Instructions (Signed)
Description   Received call that patient's INR was subtherapeutic for cardioversion.  Instructed ptient to take extra half tablet tonight and 10mg  tomorrow.  Will recheck INR on Thursday #[phone].

## 2021-08-16 NOTE — H&P (Signed)
Pt presented for DCCV today; however INR subtherapeutic at 1.7.  Patient is symptomatic with palpitations and increased dyspnea on exertion.  I explained that we cannot proceed with either cardioversion or TEE guided cardioversion today as she is subtherapeutic.  I have arranged for the Coumadin clinic to contact her today for further adjustments of her Coumadin to achieve INR of 2 or greater as quickly as possible.  I also discussed the patient with Dr. Anne Fu.  When she is 2.0 or higher will arrange TEE guided cardioversion.  She did state that she is significantly dyspneic.  I discussed admission with her today but she declined stating she needed to help take care of her husband.  Olga Millers, MD

## 2021-08-16 NOTE — H&P (View-Only) (Signed)
Pt presented for DCCV today; however INR subtherapeutic at 1.7.  Patient is symptomatic with palpitations and increased dyspnea on exertion.  I explained that we cannot proceed with either cardioversion or TEE guided cardioversion today as she is subtherapeutic.  I have arranged for the Coumadin clinic to contact her today for further adjustments of her Coumadin to achieve INR of 2 or greater as quickly as possible.  I also discussed the patient with Dr. Skains.  When she is 2.0 or higher will arrange TEE guided cardioversion.  She did state that she is significantly dyspneic.  I discussed admission with her today but she declined stating she needed to help take care of her husband.  Adelle Zachar, MD  

## 2021-08-18 ENCOUNTER — Ambulatory Visit: Payer: Medicare HMO

## 2021-08-18 ENCOUNTER — Telehealth: Payer: Self-pay | Admitting: Cardiology

## 2021-08-18 ENCOUNTER — Other Ambulatory Visit: Payer: Self-pay

## 2021-08-18 DIAGNOSIS — Z952 Presence of prosthetic heart valve: Secondary | ICD-10-CM | POA: Diagnosis not present

## 2021-08-18 DIAGNOSIS — Z5181 Encounter for therapeutic drug level monitoring: Secondary | ICD-10-CM | POA: Diagnosis not present

## 2021-08-18 LAB — POCT INR: INR: 2.8 (ref 2.0–3.0)

## 2021-08-18 NOTE — Telephone Encounter (Signed)
Attempted to schedule TEE/cardioversion earlier today for tomorrow - no availability.  Next available date to Wednesday 10/19 - case # [id number] scheduled at 10:30 am with Dr Mayford Knife.  Called and spoke with pt.  Reviewed date and time to arrive at [facility].  Advised all instructions are just like previous instructions give.  She will report at 9:30 am, NPO except medications, (holding Furosemide) and have a driver.  All questions/concerns addressed at the time of the call.  She reports she is very short of breath but refuses to go to ED for treatment at this time.  She will c/b if any further needs/concerns.

## 2021-08-18 NOTE — Telephone Encounter (Signed)
Pt is calling back to let Dr. Anne Fu and [facility] RN know that she went into coumadin clinic today, as she was advised to, to have her levels rechecked to see if she is therapeutic with her INRs, for recent INR was 1.7.  Being recent INR was 1.7, pt was informed that she could not proceed at that time with either cardioversion or TEE guided cardioversion, as she was subtherapeutic at that time.   Pts recent DCCV was cancelled on 10/11, due to INR being subtherapeutic at 1.7.  She was advised at cancelled procedure to follow-up with Coumadin clinic today and she did, with her INR now being at 2.8.  Pt was to achieve INR of 2 or greater before she could proceed to have TEE guided DCCV.  Today pt is 2.8, and she wanted to make Dr. Anne Fu and [facility] RN aware of this, so she could get her TEE guided cardioversion rescheduled as soon as possible.   Informed the pt that I will route this message now to both Dr. Anne Fu and Elita Quick RN to further review and advise, and follow-up with the pt accordingly thereafter.  Pt verbalized understanding and agrees with this plan.

## 2021-08-18 NOTE — Telephone Encounter (Signed)
Patient                                                                                                                                                                                                  is asking the nurse to give her call back, she stated that the nurse was supposed to be taken care of something for her. Please advise

## 2021-08-18 NOTE — Patient Instructions (Signed)
Continue taking 1 tablet Daily, except 0.5 tablet Tuesday and Thursday.  INR 1 week

## 2021-08-19 NOTE — Telephone Encounter (Signed)
Thank you for update. During last attempt at cardioversion (INR too low) she was urged to be admitted to the hospital for further prompt stabilization and treatment of her AFIB with RVR. She was worried about taking care of her husband at home and did not wish to be admitted.  Concerned about her having further decompensation.  Donato Schultz, MD

## 2021-08-22 ENCOUNTER — Ambulatory Visit: Payer: Medicare HMO

## 2021-08-22 ENCOUNTER — Other Ambulatory Visit: Payer: Self-pay

## 2021-08-22 DIAGNOSIS — Z5181 Encounter for therapeutic drug level monitoring: Secondary | ICD-10-CM

## 2021-08-22 DIAGNOSIS — Z952 Presence of prosthetic heart valve: Secondary | ICD-10-CM

## 2021-08-22 LAB — POCT INR: INR: 3.1 — AB (ref 2.0–3.0)

## 2021-08-22 NOTE — Patient Instructions (Signed)
Continue taking 1 tablet Daily, except 0.5 tablet Tuesday and Thursday. Eat slaw tonight. INR 1 week Cardioversion 11/19

## 2021-08-24 ENCOUNTER — Encounter (HOSPITAL_COMMUNITY): Payer: Self-pay | Admitting: Cardiology

## 2021-08-24 ENCOUNTER — Other Ambulatory Visit: Payer: Self-pay

## 2021-08-24 ENCOUNTER — Ambulatory Visit (HOSPITAL_BASED_OUTPATIENT_CLINIC_OR_DEPARTMENT_OTHER)
Admission: RE | Admit: 2021-08-24 | Discharge: 2021-08-24 | Disposition: A | Discharge status: Home / Self Care | Payer: Medicare HMO | Source: Ambulatory Visit | Attending: Cardiology | Admitting: Cardiology

## 2021-08-24 ENCOUNTER — Encounter (HOSPITAL_COMMUNITY)
Admission: RE | Disposition: A | Discharge status: Home / Self Care | Payer: Self-pay | Source: Ambulatory Visit | Attending: Cardiology

## 2021-08-24 ENCOUNTER — Ambulatory Visit (HOSPITAL_COMMUNITY): Payer: Medicare HMO | Admitting: Certified Registered Nurse Anesthetist

## 2021-08-24 ENCOUNTER — Ambulatory Visit (HOSPITAL_COMMUNITY)
Admission: RE | Admit: 2021-08-24 | Discharge: 2021-08-24 | Disposition: A | Discharge status: Home / Self Care | Payer: Medicare HMO | Source: Ambulatory Visit | Attending: Cardiology | Admitting: Cardiology

## 2021-08-24 DIAGNOSIS — I088 Other rheumatic multiple valve diseases: Secondary | ICD-10-CM | POA: Diagnosis not present

## 2021-08-24 DIAGNOSIS — I4819 Other persistent atrial fibrillation: Secondary | ICD-10-CM | POA: Diagnosis not present

## 2021-08-24 DIAGNOSIS — I361 Nonrheumatic tricuspid (valve) insufficiency: Secondary | ICD-10-CM | POA: Diagnosis not present

## 2021-08-24 DIAGNOSIS — I340 Nonrheumatic mitral (valve) insufficiency: Secondary | ICD-10-CM

## 2021-08-24 DIAGNOSIS — Z885 Allergy status to narcotic agent status: Secondary | ICD-10-CM | POA: Insufficient documentation

## 2021-08-24 DIAGNOSIS — I700 Atherosclerosis of aorta: Secondary | ICD-10-CM | POA: Insufficient documentation

## 2021-08-24 DIAGNOSIS — Z79890 Hormone replacement therapy (postmenopausal): Secondary | ICD-10-CM | POA: Diagnosis not present

## 2021-08-24 DIAGNOSIS — Z8616 Personal history of COVID-19: Secondary | ICD-10-CM | POA: Diagnosis not present

## 2021-08-24 DIAGNOSIS — I110 Hypertensive heart disease with heart failure: Secondary | ICD-10-CM | POA: Diagnosis not present

## 2021-08-24 DIAGNOSIS — E7800 Pure hypercholesterolemia, unspecified: Secondary | ICD-10-CM | POA: Diagnosis not present

## 2021-08-24 DIAGNOSIS — I081 Rheumatic disorders of both mitral and tricuspid valves: Secondary | ICD-10-CM | POA: Diagnosis not present

## 2021-08-24 DIAGNOSIS — Z79899 Other long term (current) drug therapy: Secondary | ICD-10-CM | POA: Insufficient documentation

## 2021-08-24 DIAGNOSIS — Z952 Presence of prosthetic heart valve: Secondary | ICD-10-CM | POA: Diagnosis not present

## 2021-08-24 DIAGNOSIS — Z7901 Long term (current) use of anticoagulants: Secondary | ICD-10-CM | POA: Insufficient documentation

## 2021-08-24 DIAGNOSIS — Z888 Allergy status to other drugs, medicaments and biological substances status: Secondary | ICD-10-CM | POA: Diagnosis not present

## 2021-08-24 DIAGNOSIS — R0609 Other forms of dyspnea: Secondary | ICD-10-CM | POA: Diagnosis not present

## 2021-08-24 DIAGNOSIS — I5032 Chronic diastolic (congestive) heart failure: Secondary | ICD-10-CM | POA: Diagnosis not present

## 2021-08-24 HISTORY — PX: TEE WITHOUT CARDIOVERSION: SHX5443

## 2021-08-24 HISTORY — PX: CARDIOVERSION: SHX1299

## 2021-08-24 LAB — PROTIME-INR
INR: 2.2 — ABNORMAL HIGH (ref 0.8–1.2)
Prothrombin Time: 24.6 seconds — ABNORMAL HIGH (ref 11.4–15.2)

## 2021-08-24 LAB — ECHO TEE
Area-P 1/2: 2.42 cm2
MV M vel: 5.21 m/s
MV Peak grad: 108.6 mmHg

## 2021-08-24 SURGERY — ECHOCARDIOGRAM, TRANSESOPHAGEAL
Anesthesia: General

## 2021-08-24 MED ORDER — PROPOFOL 10 MG/ML IV BOLUS
INTRAVENOUS | Status: DC | PRN
  Administered 2021-08-24 (×2): 10 mg via INTRAVENOUS

## 2021-08-24 MED ORDER — LIDOCAINE 2% (20 MG/ML) 5 ML SYRINGE
INTRAMUSCULAR | Status: DC | PRN
  Administered 2021-08-24: 70 mg via INTRAVENOUS

## 2021-08-24 MED ORDER — HYDROCORTISONE 1 % EX CREA
1.0000 "application " | TOPICAL_CREAM | Freq: Three times a day (TID) | CUTANEOUS | Status: DC | PRN
  Filled 2021-08-24: qty 28

## 2021-08-24 MED ORDER — FUROSEMIDE 20 MG PO TABS: 20.0000 mg | ORAL_TABLET | Freq: Every day | ORAL | Status: DC | PRN

## 2021-08-24 MED ORDER — WARFARIN SODIUM 5 MG PO TABS: 2.5000 mg | ORAL_TABLET | ORAL | Status: DC

## 2021-08-24 MED ORDER — SODIUM CHLORIDE 0.9 % IV SOLN: INTRAVENOUS | Status: DC

## 2021-08-24 MED ORDER — PHENYLEPHRINE 40 MCG/ML (10ML) SYRINGE FOR IV PUSH (FOR BLOOD PRESSURE SUPPORT)
PREFILLED_SYRINGE | INTRAVENOUS | Status: DC | PRN
  Administered 2021-08-24: 160 ug via INTRAVENOUS
  Administered 2021-08-24: 120 ug via INTRAVENOUS
  Administered 2021-08-24: 80 ug via INTRAVENOUS

## 2021-08-24 MED ORDER — SODIUM CHLORIDE 0.9 % IV SOLN: INTRAVENOUS | Status: DC | PRN

## 2021-08-24 MED ORDER — PROPOFOL 500 MG/50ML IV EMUL
INTRAVENOUS | Status: DC | PRN
  Administered 2021-08-24: 75 ug/kg/min via INTRAVENOUS

## 2021-08-24 MED ORDER — PANTOPRAZOLE SODIUM 40 MG PO TBEC: 40.0000 mg | DELAYED_RELEASE_TABLET | Freq: Every day | ORAL | Status: DC | PRN

## 2021-08-24 NOTE — Anesthesia Procedure Notes (Signed)
Procedure Name: MAC Date/Time: 08/24/2021 11:15 AM Performed by: Dorthea Cove, CRNA Pre-anesthesia Checklist: Patient identified, Emergency Drugs available, Suction available, Patient being monitored and Timeout performed Patient Re-evaluated:Patient Re-evaluated prior to induction Oxygen Delivery Method: Nasal cannula Preoxygenation: Pre-oxygenation with 100% oxygen Induction Type: IV induction Placement Confirmation: positive ETCO2 and CO2 detector Dental Injury: Teeth and Oropharynx as per pre-operative assessment

## 2021-08-24 NOTE — Interval H&P Note (Signed)
History and Physical Interval Note:  08/24/2021 10:25 AM  Tracy Vargas  has presented today for surgery, with the diagnosis of A-FIB.  The various methods of treatment have been discussed with the patient and family. After consideration of risks, benefits and other options for treatment, the patient has consented to  Procedure(s): TRANSESOPHAGEAL ECHOCARDIOGRAM (TEE) (N/A) CARDIOVERSION (N/A) as a surgical intervention.  The patient's history has been reviewed, patient examined, no change in status, stable for surgery.  I have reviewed the patient's chart and labs.  Questions were answered to the patient's satisfaction.     Armanda Magic

## 2021-08-24 NOTE — Progress Notes (Signed)
Dr. Mayford Knife in to see patient post procedure. Dr. Mayford Knife listened to patient as patient felt a "squeaking" noise while breathing but denies any difficulty breathing. Dr. Mayford Knife did not hear anything out of the ordinary and is good with discharge.

## 2021-08-24 NOTE — Discharge Instructions (Signed)

## 2021-08-24 NOTE — CV Procedure (Signed)
    PROCEDURE NOTE:  Procedure:  Transesophageal echocardiogram Operator:  Armanda Magic, MD Indications:  persistent atrial fibrillation Complications: None  During this procedure the patient is administered a total of Lidocaine 70mg  and Propofol 178 mg to achieve and maintain moderate conscious sedation.  The patient's heart rate, blood pressure, and oxygen saturation are monitored continuously during the procedure by anesthesia.   Results: Normal LV size and function  Normal RV size and function Mildly dilated RA Moderately dilated LA.  There is no LA or LAA thrombus. No spontaneous echo contrast.  Emptying velocity < 40 Normal TV with moderate TR Normal PV with trivial PR Moderate mitral annular calcification with mildly thickened and calcified mitral valve leaflets with moderate MR and 2 regurgitant jets Mechanical aortic valve well seated with no rocking motion and normal function.  No perivavular AI. Hypermobile interatrial septum with no evidence of shunt by colorflow dopper  Mild atherosclerosis of the thoracic and ascending aorta.  Procedure: Electrical Cardioversion Indications:  Atrial Fibrillation  Time Out: Verified patient identification, verified procedure,medications/allergies/relevent history reviewed, required imaging and test results available.  Performed  Procedure Details  The patient was NPO after midnight. Cardioversion was done with synchronized biphasic defibrillation with AP pads with 150watts.  The patient converted to normal sinus rhythm. The patient tolerated the procedure well   IMPRESSION:  Successful cardioversion of atrial fibrillation    Jozie Wulf 08/24/2021, 10:26 AM    Signed: 08/26/2021, MD [facility]

## 2021-08-24 NOTE — Transfer of Care (Signed)
Immediate Anesthesia Transfer of Care Note  Patient: Tracy Vargas  Procedure(s) Performed: TRANSESOPHAGEAL ECHOCARDIOGRAM (TEE) CARDIOVERSION  Patient Location: Endoscopy Unit  Anesthesia Type:General  Level of Consciousness: awake, alert  and oriented  Airway & Oxygen Therapy: Patient Spontanous Breathing and Patient connected to nasal cannula oxygen  Post-op Assessment: Report given to RN and Post -op Vital signs reviewed and stable  Post vital signs: Reviewed and stable  Last Vitals:  Vitals Value Taken Time  BP    Temp    Pulse 66 08/24/21 1153  Resp 22 08/24/21 1153  SpO2 94 % 08/24/21 1153    Last Pain:  Vitals:   08/24/21 0919  TempSrc: Temporal  PainSc: 0-No pain         Complications: No notable events documented.

## 2021-08-24 NOTE — Anesthesia Postprocedure Evaluation (Signed)
Anesthesia Post Note  Patient: Tracy Vargas  Procedure(s) Performed: TRANSESOPHAGEAL ECHOCARDIOGRAM (TEE) CARDIOVERSION     Patient location during evaluation: PACU Anesthesia Type: General Level of consciousness: awake and alert Pain management: pain level controlled Vital Signs Assessment: post-procedure vital signs reviewed and stable Respiratory status: spontaneous breathing, nonlabored ventilation, respiratory function stable and patient connected to nasal cannula oxygen Cardiovascular status: blood pressure returned to baseline and stable Postop Assessment: no apparent nausea or vomiting Anesthetic complications: no   No notable events documented.  Last Vitals:  Vitals:   08/24/21 1210 08/24/21 1215  BP:  (!) 106/52  Pulse: 62 61  Resp: 17 18  Temp:    SpO2: 93% 93%    Last Pain:  Vitals:   08/24/21 1215  TempSrc:   PainSc: 0-No pain                 Shelton Silvas

## 2021-08-24 NOTE — Anesthesia Preprocedure Evaluation (Signed)
Anesthesia Evaluation  Patient identified by MRN, date of birth, ID band Patient awake    Reviewed: Allergy & Precautions, NPO status , Patient's Chart, lab work & pertinent test results  Airway Mallampati: II  TM Distance: >3 FB Neck ROM: Full    Dental  (+) Teeth Intact, Dental Advisory Given   Pulmonary    breath sounds clear to auscultation       Cardiovascular hypertension, Pt. on medications  Rhythm:Irregular Rate:Abnormal     Neuro/Psych negative neurological ROS  negative psych ROS   GI/Hepatic Neg liver ROS, GERD  Medicated,  Endo/Other  negative endocrine ROS  Renal/GU negative Renal ROS     Musculoskeletal negative musculoskeletal ROS (+)   Abdominal Normal abdominal exam  (+)   Peds  Hematology negative hematology ROS (+)   Anesthesia Other Findings   Reproductive/Obstetrics                             Anesthesia Physical Anesthesia Plan  ASA: 3  Anesthesia Plan: General   Post-op Pain Management:    Induction: Intravenous  PONV Risk Score and Plan: 0 and Propofol infusion  Airway Management Planned: Natural Airway and Simple Face Mask  Additional Equipment: None  Intra-op Plan:   Post-operative Plan:   Informed Consent: I have reviewed the patients History and Physical, chart, labs and discussed the procedure including the risks, benefits and alternatives for the proposed anesthesia with the patient or authorized representative who has indicated his/her understanding and acceptance.       Plan Discussed with: CRNA  Anesthesia Plan Comments:         Anesthesia Quick Evaluation

## 2021-08-24 NOTE — Interval H&P Note (Signed)
History and Physical Interval Note:  08/24/2021 10:25 AM  Tracy Vargas  has presented today for surgery, with the diagnosis of A-FIB.  The various methods of treatment have been discussed with the patient and family. After consideration of risks, benefits and other options for treatment, the patient has consented to  Procedure(s): TRANSESOPHAGEAL ECHOCARDIOGRAM (TEE) (N/A) CARDIOVERSION (N/A) as a surgical intervention.  The patient's history has been reviewed, patient examined, no change in status, stable for surgery.  I have reviewed the patient's chart and labs.  Questions were answered to the patient's satisfaction.     Kasey Ewings   

## 2021-08-25 ENCOUNTER — Ambulatory Visit: Payer: Medicare HMO | Admitting: Cardiology

## 2021-08-25 ENCOUNTER — Encounter: Payer: Self-pay | Admitting: Cardiology

## 2021-08-25 ENCOUNTER — Telehealth: Payer: Self-pay | Admitting: Student

## 2021-08-25 VITALS — BP 140/80 | HR 81 | Ht 61.0 in | Wt 161.0 lb

## 2021-08-25 DIAGNOSIS — I340 Nonrheumatic mitral (valve) insufficiency: Secondary | ICD-10-CM | POA: Diagnosis not present

## 2021-08-25 DIAGNOSIS — R0602 Shortness of breath: Secondary | ICD-10-CM | POA: Diagnosis not present

## 2021-08-25 DIAGNOSIS — I480 Paroxysmal atrial fibrillation: Secondary | ICD-10-CM

## 2021-08-25 DIAGNOSIS — I10 Essential (primary) hypertension: Secondary | ICD-10-CM | POA: Diagnosis not present

## 2021-08-25 MED ORDER — FUROSEMIDE 40 MG PO TABS: 40.0000 mg | ORAL_TABLET | Freq: Two times a day (BID) | ORAL | 1 refills | Status: DC

## 2021-08-25 NOTE — Telephone Encounter (Signed)
   Patient called Answering Service with reports of shortness of breath after TEE/DCCV for atrial fibrillation yesterday. She describes orthopnea overnight and states she had to sleep on multiple pillows. Also notes significant dyspnea on exertion when walking in her house. She denies any chest pain. She does feel like her heart rate is elevated. I had her check her BP/HR while on the phone with BP. BP was in the 170s/90s and heart rate was in the 80s. No syncope. I advised patient to take her Lasix this morning as well as her Coumadin and BP medications. Will try to keep her out of the ED but emphasized that if her breathing does improve with Lasix, she should go to the ED for further evaluation. Patient voiced understanding and agreed.  I tried to arrange a follow-up appointment for patient but was having trouble with this. Will route note to scheduling pool to arrange follow-up with Dr. Anne Fu or an APP as soon as possible.   Corrin Parker, PA-C 08/25/2021 8:05 AM

## 2021-08-25 NOTE — Assessment & Plan Note (Signed)
Successful cardioversion yesterday.  She felt short of breath.  Thankfully, she is still in normal rhythm with her EKG today showing heart rate 81 bpm in sinus rhythm.  With her shortness of breath, she did have crackles in her lungs at the bases.  She is also up a few pounds.  Her baseline weight has been in the 150s.  We will give her Lasix 40 mg twice a day 7 days and then cut her back to 20 mg daily after that.  We will see her back in 7 days to make sure she is doing well. A bedside echo was performed that showed normal pump function.  There was no stunning of the myocardium.

## 2021-08-25 NOTE — Progress Notes (Signed)
Cardiology Office Note:    Date:  08/25/2021   ID:  Tracy Vargas, DOB 01-26-1934, MRN [id number]  PCP:  Merri Brunette, MD   [facility] Providers Cardiologist:  Donato Schultz, MD     Referring MD: Merri Brunette, MD     History of Present Illness:    Tracy Vargas is a 87 y.o. female here for evaluation of moderate shortness of breath post cardioversion yesterday.  Moderate mitral regurgitation noted.  Her weight is also up about 10 pounds.  Crackles noted on exam.  No associated chest pain  She is not in extreme distress finding means.  We are going to give her more Lasix.  See below.  Past Medical History:  Diagnosis Date   Acquired hyperlipoproteinemia    Chronic diastolic heart failure (HCC) 09/01/2013   Diastolic dysfunction    Dyspnea    HTN (hypertension)    Hyperlipidemia    Osteopenia     Past Surgical History:  Procedure Laterality Date   APPENDECTOMY     CARDIAC VALVE REPLACEMENT     CARDIOVERSION N/A 08/24/2021   Procedure: CARDIOVERSION;  Surgeon: Quintella Reichert, MD;  Location: [facility] ENDOSCOPY;  Service: Cardiovascular;  Laterality: N/A;   CATARACT EXTRACTION     ESOPHAGOGASTRODUODENOSCOPY N/A 07/14/2016   Procedure: ESOPHAGOGASTRODUODENOSCOPY (EGD);  Surgeon: Dorena Cookey, MD;  Location: [facility] ENDOSCOPY;  Service: Endoscopy;  Laterality: N/A;   ROTATOR CUFF REPAIR     TEE WITHOUT CARDIOVERSION N/A 08/24/2021   Procedure: TRANSESOPHAGEAL ECHOCARDIOGRAM (TEE);  Surgeon: Quintella Reichert, MD;  Location: [facility] ENDOSCOPY;  Service: Cardiovascular;  Laterality: N/A;   TONSILLECTOMY      Current Medications: Current Meds  Medication Sig   Acetylcarnitine HCl (ACETYL L-CARNITINE) 500 MG CAPS Take 500 mg by mouth daily.   Acetylcysteine (NAC 600) 600 MG CAPS Take 600 mg by mouth daily.   alum & mag hydroxide-simeth (MYLANTA MAXIMUM STRENGTH) 400-400-40 MG/5ML suspension Take 15 mLs by mouth every 6 (six) hours as needed for indigestion.   amLODipine (NORVASC) 2.5 MG  tablet Take 2.5 mg by mouth daily.   amoxicillin (AMOXIL) 500 MG capsule Take 4 capsules by mouth as needed (4 capsules prior to dental appointment).    Ascorbic Acid (VITAMIN C) 1000 MG tablet Take 1,000 mg by mouth daily.   atorvastatin (LIPITOR) 10 MG tablet Take 10 mg by mouth daily at 6 PM.    b complex vitamins capsule Take 1 capsule by mouth daily.   Biotin 5000 MCG TABS Take 5,000 mcg by mouth once a week.   calcium citrate-vitamin D (CITRACAL+D) 315-200 MG-UNIT tablet Take 1 tablet by mouth at bedtime.   Cholecalciferol (VITAMIN D) 50 MCG (2000 UT) CAPS Take 2,000 Units by mouth daily.   docusate sodium (COLACE) 250 MG capsule Take 250 mg by mouth daily as needed for constipation.   ferrous sulfate 325 (65 FE) MG tablet Take 325 mg by mouth once a week.   fluticasone (FLONASE) 50 MCG/ACT nasal spray Place 2 sprays into both nostrils daily.   furosemide (LASIX) 20 MG tablet Take 1 tablet (20 mg total) by mouth daily as needed for edema (Takes when not leaving the house).   furosemide (LASIX) 40 MG tablet Take 1 tablet (40 mg total) by mouth 2 (two) times daily. Take 1 tablet twice a day for 7 days then resume 20 mg daily   levothyroxine (SYNTHROID) 25 MCG tablet Take 25 mcg by mouth daily before breakfast.   Magnesium 400 MG CAPS  Take 400 mg by mouth 2 (two) times daily. Triple complex   omega-3 acid ethyl esters (LOVAZA) 1 g capsule Take 1 g by mouth daily.   OVER THE COUNTER MEDICATION Take 1 capsule by mouth daily. uric acid cleanse   OVER THE COUNTER MEDICATION Take 1 capsule by mouth 2 (two) times daily. leg vein essentials   OVER THE COUNTER MEDICATION Place 1 drop into both eyes daily as needed (dry eye).   OVER THE COUNTER MEDICATION Take 1 capsule by mouth 3 (three) times daily with meals. LipoSan Ultra Chitosan   pantoprazole (PROTONIX) 40 MG tablet Take 1 tablet (40 mg total) by mouth daily as needed (acid reflux).   Polyethyl Glycol-Propyl Glycol (SYSTANE) 0.4-0.3 % SOLN  Place 1 drop into both eyes daily as needed (Dry eye).   polyethylene glycol (MIRALAX / GLYCOLAX) 17 g packet Take 17 g by mouth daily.   TART CHERRY PO Take 3,000 mg by mouth daily.   valsartan (DIOVAN) 160 MG tablet Take 1 tablet (160 mg total) by mouth daily.   Varenicline Tartrate (TYRVAYA) 0.03 MG/ACT SOLN Place 1 spray into both nostrils daily as needed (Allergies).   warfarin (JANTOVEN) 5 MG tablet Take 0.5-1 tablets (2.5-5 mg total) by mouth See admin instructions. Take 2.5 mg on Tuesday and Thursday. Take 5 mg all the other days   zinc gluconate 50 MG tablet Take 50 mg by mouth daily.     Allergies:   Codeine and Sudafed [pseudoephedrine hcl]   Social History   Socioeconomic History   Marital status: Married    Spouse name: Not on file   Number of children: Not on file   Years of education: Not on file   Highest education level: Not on file  Occupational History   Not on file  Tobacco Use   Smoking status: Never   Smokeless tobacco: Never  Substance and Sexual Activity   Alcohol use: No   Drug use: No   Sexual activity: Not on file  Other Topics Concern   Not on file  Social History Narrative   Not on file   Social Determinants of Health   Financial Resource Strain: Not on file  Food Insecurity: Not on file  Transportation Needs: Not on file  Physical Activity: Not on file  Stress: Not on file  Social Connections: Not on file     Family History: The patient's family history includes Heart failure in her sister.  ROS:   Please see the history of present illness.     All other systems reviewed and are negative.  EKGs/Labs/Other Studies Reviewed:    The following studies were reviewed today:    1. Left ventricular ejection fraction, by estimation, is 60 to 65%. The  left ventricle has normal function. The left ventricle has no regional  wall motion abnormalities.   2. Right ventricular systolic function is normal. The right ventricular  size is normal.    3. Left atrial size was moderately dilated. No left atrial/left atrial  appendage thrombus was detected. The LAA emptying velocity was 20 cm/s.   4. Right atrial size was mildly dilated.   5. The mitral valve is normal in structure. Moderate mitral valve  regurgitation with 2 prominent jets. No evidence of mitral stenosis.  Moderate mitral annular calcification.   6. Tricuspid valve regurgitation is moderate.   7. The aortic valve has been repaired/replaced. Aortic valve  regurgitation is not visualized. No aortic stenosis is present. There is a  mechanical valve present in the aortic position.   8. There is mild (Grade II) layered plaque involving the ascending aorta,  aortic root and descending aorta.   9. The inferior vena cava is normal in size with greater than 50%  respiratory variability, suggesting right atrial pressure of 3 mmHg.   Conclusion(s)/Recommendation(s): Normal biventricular function moderate  mitral regurgitation with 2 prominent jets. No LA or LAA thrombus. No  LA/LAA thrombus identified. Successful cardioversion performed with  restoration of normal sinus rhythm.   EKG:  EKG is  ordered today.  The ekg ordered today demonstrates NSR 81  Recent Labs: 08/16/2021: BUN 19; Creatinine, Ser 0.80; Hemoglobin 12.6; Potassium 4.3; Sodium 140  Recent Lipid Panel    Component Value Date/Time   TRIG 105 04/01/2020 1431     Risk Assessment/Calculations:          Physical Exam:    VS:  BP 140/80 (BP Location: Left Arm, Patient Position: Sitting, Cuff Size: Normal)   Pulse 81   Ht 5\' 1"  (1.549 m)   Wt 161 lb (73 kg)   SpO2 94%   BMI 30.42 kg/m     Wt Readings from Last 3 Encounters:  08/25/21 161 lb (73 kg)  08/24/21 154 lb 15.7 oz (70.3 kg)  08/16/21 155 lb (70.3 kg)     GEN:  Well nourished, well developed in no acute distress HEENT: Normal NECK: No JVD; No carotid bruits LYMPHATICS: No lymphadenopathy CARDIAC: RRR, holosyst murmurs, rubs,  gallops RESPIRATORY:  Clear to auscultation without rales, wheezing or rhonchi  ABDOMEN: Soft, non-tender, non-distended MUSCULOSKELETAL:  No edema; No deformity  SKIN: Warm and dry NEUROLOGIC:  Alert and oriented x 3 PSYCHIATRIC:  Normal affect   ASSESSMENT:    1. Shortness of breath   2. Essential hypertension   3. Paroxysmal atrial fibrillation (HCC)   4. Moderate mitral regurgitation    PLAN:    In order of problems listed above:  Paroxysmal atrial fibrillation (HCC) Successful cardioversion yesterday.  She felt short of breath.  Thankfully, she is still in normal rhythm with her EKG today showing heart rate 81 bpm in sinus rhythm.  With her shortness of breath, she did have crackles in her lungs at the bases.  She is also up a few pounds.  Her baseline weight has been in the 150s.  We will give her Lasix 40 mg twice a day 7 days and then cut her back to 20 mg daily after that.  We will see her back in 7 days to make sure she is doing well. A bedside echo was performed that showed normal pump function.  There was no stunning of the myocardium.   Moderate mitral regurgitation Holosystolic murmur heard on exam.  TEE demonstrated moderate mitral regurgitation.  We will continue to monitor this clinically.  We will repeat echocardiogram in 1 year.        Medication Adjustments/Labs and Tests Ordered: Current medicines are reviewed at length with the patient today.  Concerns regarding medicines are outlined above.  Orders Placed This Encounter  Procedures   EKG 12-Lead   Meds ordered this encounter  Medications   furosemide (LASIX) 40 MG tablet    Sig: Take 1 tablet (40 mg total) by mouth 2 (two) times daily. Take 1 tablet twice a day for 7 days then resume 20 mg daily    Dispense:  14 tablet    Refill:  1    Patient Instructions  Medication Instructions:  Please  take Furosemide 40 mg (1) tablet twice a day for the next 7 days.  Then resume 20 mg every day. Continue all  other medications as listed.  *If you need a refill on your cardiac medications before your next appointment, please call your pharmacy*  Follow-Up: At [facility], you and your health needs are our priority.  As part of our continuing mission to provide you with exceptional heart care, we have created designated Provider Care Teams.  These Care Teams include your primary Cardiologist (physician) and Advanced Practice Providers (APPs -  Physician Assistants and Nurse Practitioners) who all work together to provide you with the care you need, when you need it.  We recommend signing up for the patient portal called "MyChart".  Sign up information is provided on this After Visit Summary.  MyChart is used to connect with patients for Virtual Visits (Telemedicine).  Patients are able to view lab/test results, encounter notes, upcoming appointments, etc.  Non-urgent messages can be sent to your provider as well.   To learn more about what you can do with MyChart, go to [email or web address].    Your next appointment:   1 week(s)  The format for your next appointment:   In Person  Provider:   Donato Schultz, MD   Thank you for choosing [facility]!!     Signed, Donato Schultz, MD  08/25/2021 5:12 PM    [facility]

## 2021-08-25 NOTE — Patient Instructions (Signed)
Medication Instructions:  Please take Furosemide 40 mg (1) tablet twice a day for the next 7 days.  Then resume 20 mg every day. Continue all other medications as listed.  *If you need a refill on your cardiac medications before your next appointment, please call your pharmacy*  Follow-Up: At [facility], you and your health needs are our priority.  As part of our continuing mission to provide you with exceptional heart care, we have created designated Provider Care Teams.  These Care Teams include your primary Cardiologist (physician) and Advanced Practice Providers (APPs -  Physician Assistants and Nurse Practitioners) who all work together to provide you with the care you need, when you need it.  We recommend signing up for the patient portal called "MyChart".  Sign up information is provided on this After Visit Summary.  MyChart is used to connect with patients for Virtual Visits (Telemedicine).  Patients are able to view lab/test results, encounter notes, upcoming appointments, etc.  Non-urgent messages can be sent to your provider as well.   To learn more about what you can do with MyChart, go to [email or web address].    Your next appointment:   1 week(s)  The format for your next appointment:   In Person  Provider:   Donato Schultz, MD   Thank you for choosing [facility]!!

## 2021-08-25 NOTE — Assessment & Plan Note (Signed)
Holosystolic murmur heard on exam.  TEE demonstrated moderate mitral regurgitation.  We will continue to monitor this clinically.  We will repeat echocardiogram in 1 year.

## 2021-08-26 ENCOUNTER — Telehealth: Payer: Self-pay | Admitting: Cardiology

## 2021-08-26 NOTE — Telephone Encounter (Signed)
Pt calling in concerning her SOB.   States she is using the bathroom every few minutes and worried about this being normal or if it is too much as she usually takes 20 mg Lasix. She is anxious/worried on the phone. She is asking if she should be taking this BID cause of what is already producing. Pt advised that yes she should continue taking it BID as instructed, advised not to take her afternoon dose later than 3 pm. She calms down while on phone with her, she then states that she has had a lot of friends/family calling this  morning to check on her and been talking more so that and frequent bathroom trips could be contributory to this. She understands that the medication needs time to work, she only increased Lasix to BID yesterday evening. She understands to call after hours/over weekend if this worsens or doesn't start to improve. Pt feels more comfortable after speaking with me and is agreeable to plan.

## 2021-08-26 NOTE — Telephone Encounter (Signed)
Pt c/o Shortness Of Breath: STAT if SOB developed within the last 24 hours or pt is noticeably SOB on the phone  1. Are you currently SOB (can you hear that pt is SOB on the phone)? yes  2. How long have you been experiencing SOB? started Wednesday and has gotten worse today  3. Are you SOB when sitting or when up moving around? both  4. Are you currently experiencing any other symptoms? no   Patient states her SOB has gotten worse and she become very concerned and emotional during call as I attempted to clarify when the symptoms started. Patient states she did have symptoms Wednesday and yesterday, but they have gotten worse. She states this morning she noticed a little SOB when she got up to go to the bathroom, but it has gotten worse and states now it is very bad. She states she is not having any other symptoms.

## 2021-09-01 ENCOUNTER — Ambulatory Visit: Payer: Medicare HMO | Admitting: Cardiology

## 2021-09-01 ENCOUNTER — Other Ambulatory Visit: Payer: Self-pay

## 2021-09-01 ENCOUNTER — Telehealth: Payer: Self-pay | Admitting: Cardiology

## 2021-09-01 ENCOUNTER — Ambulatory Visit: Payer: Medicare HMO

## 2021-09-01 ENCOUNTER — Encounter: Payer: Self-pay | Admitting: Cardiology

## 2021-09-01 VITALS — BP 120/70 | HR 80 | Ht 61.0 in | Wt 150.0 lb

## 2021-09-01 DIAGNOSIS — I5032 Chronic diastolic (congestive) heart failure: Secondary | ICD-10-CM

## 2021-09-01 DIAGNOSIS — I10 Essential (primary) hypertension: Secondary | ICD-10-CM | POA: Diagnosis not present

## 2021-09-01 DIAGNOSIS — Z5181 Encounter for therapeutic drug level monitoring: Secondary | ICD-10-CM | POA: Diagnosis not present

## 2021-09-01 DIAGNOSIS — I480 Paroxysmal atrial fibrillation: Secondary | ICD-10-CM

## 2021-09-01 DIAGNOSIS — I340 Nonrheumatic mitral (valve) insufficiency: Secondary | ICD-10-CM

## 2021-09-01 DIAGNOSIS — Z952 Presence of prosthetic heart valve: Secondary | ICD-10-CM

## 2021-09-01 LAB — TSH: TSH: 3.52 u[IU]/mL (ref 0.450–4.500)

## 2021-09-01 LAB — COMPREHENSIVE METABOLIC PANEL
ALT: 32 IU/L (ref 0–32)
AST: 34 IU/L (ref 0–40)
Albumin/Globulin Ratio: 2 (ref 1.2–2.2)
Albumin: 4.3 g/dL (ref 3.6–4.6)
Alkaline Phosphatase: 152 IU/L — ABNORMAL HIGH (ref 44–121)
BUN/Creatinine Ratio: 21 (ref 12–28)
BUN: 21 mg/dL (ref 8–27)
Bilirubin Total: 0.6 mg/dL (ref 0.0–1.2)
CO2: 27 mmol/L (ref 20–29)
Calcium: 9.4 mg/dL (ref 8.7–10.3)
Chloride: 101 mmol/L (ref 96–106)
Creatinine, Ser: 0.99 mg/dL (ref 0.57–1.00)
Globulin, Total: 2.1 g/dL (ref 1.5–4.5)
Glucose: 92 mg/dL (ref 70–99)
Potassium: 4.4 mmol/L (ref 3.5–5.2)
Sodium: 141 mmol/L (ref 134–144)
Total Protein: 6.4 g/dL (ref 6.0–8.5)
eGFR: 56 mL/min/{1.73_m2} — ABNORMAL LOW (ref >59)

## 2021-09-01 LAB — T4, FREE: Free T4: 1.29 ng/dL (ref 0.82–1.77)

## 2021-09-01 LAB — POCT INR: INR: 2.2 (ref 2.0–3.0)

## 2021-09-01 MED ORDER — AMIODARONE HCL 200 MG PO TABS: 200.0000 mg | ORAL_TABLET | Freq: Two times a day (BID) | ORAL | 3 refills | Status: DC

## 2021-09-01 NOTE — Assessment & Plan Note (Signed)
Atrial fibrillation has once again returned.  This is likely contributing to her symptoms of gasping, shortness of breath, diastolic heart failure.  Because of this, and recent failure of cardioversion, we will go ahead and place her on amiodarone 200 mg twice a day gentle load.  She does not wish to go to the hospital because her husband is debilitated and needs her assistance at home.  We will do this carefully.  At 1 month, we will see her back and hopefully this will chemically cardiovert.  If not, we will go ahead and set her up again for cardioversion.

## 2021-09-01 NOTE — Assessment & Plan Note (Signed)
Noted once again on TEE.  Playing a role also in her symptomatology.  This is not severe however.

## 2021-09-01 NOTE — Patient Instructions (Signed)
Continue taking 1 tablet Daily, except 0.5 tablet Tuesday and Thursday. Amiodarone started 10/27.  INR 2 weeks

## 2021-09-01 NOTE — Progress Notes (Signed)
Cardiology Office Note:    Date:  09/01/2021   ID:  CHAUNDA Vargas, DOB [birth year], MRN [id number]  PCP:  Merri Brunette, MD   [facility] Providers Cardiologist:  Donato Schultz, MD     Referring MD: Merri Brunette, MD     History of Present Illness:    Tracy Vargas is a 87 y.o. female here for follow-up of shortness of breath following cardioversion approximately a week and a half ago.    From prior visit: Moderate mitral regurgitation noted on transesophageal echocardiogram.  Her weight is also up about 10 pounds.  Crackles noted on exam.  No associated chest pain  Unfortunately, she is still feeling winded at times especially when doing any sort of activity.  She states I know I am's 87 years old but I want to have more energy than this.  I have to stop and sometimes I feel gasping.  Today I appreciated irregularly irregular rhythm once again tachycardic.  Has mechanical aortic valve-1999 Chronic Coumadin use INR 2-3 Mild carotid plaque bilaterally   Past Medical History:  Diagnosis Date   Acquired hyperlipoproteinemia    Chronic diastolic heart failure (HCC) 09/01/2013   Diastolic dysfunction    Dyspnea    HTN (hypertension)    Hyperlipidemia    Osteopenia     Past Surgical History:  Procedure Laterality Date   APPENDECTOMY     CARDIAC VALVE REPLACEMENT     CARDIOVERSION N/A 08/24/2021   Procedure: CARDIOVERSION;  Surgeon: Quintella Reichert, MD;  Location: [facility] ENDOSCOPY;  Service: Cardiovascular;  Laterality: N/A;   CATARACT EXTRACTION     ESOPHAGOGASTRODUODENOSCOPY N/A 07/14/2016   Procedure: ESOPHAGOGASTRODUODENOSCOPY (EGD);  Surgeon: Dorena Cookey, MD;  Location: [facility] ENDOSCOPY;  Service: Endoscopy;  Laterality: N/A;   ROTATOR CUFF REPAIR     TEE WITHOUT CARDIOVERSION N/A 08/24/2021   Procedure: TRANSESOPHAGEAL ECHOCARDIOGRAM (TEE);  Surgeon: Quintella Reichert, MD;  Location: [facility] ENDOSCOPY;  Service: Cardiovascular;  Laterality: N/A;   TONSILLECTOMY      Current  Medications: Current Meds  Medication Sig   Acetylcarnitine HCl (ACETYL L-CARNITINE) 500 MG CAPS Take 500 mg by mouth daily.   Acetylcysteine (NAC 600) 600 MG CAPS Take 600 mg by mouth daily.   alum & mag hydroxide-simeth (MYLANTA MAXIMUM STRENGTH) 400-400-40 MG/5ML suspension Take 15 mLs by mouth every 6 (six) hours as needed for indigestion.   amiodarone (PACERONE) 200 MG tablet Take 1 tablet (200 mg total) by mouth 2 (two) times daily.   amLODipine (NORVASC) 2.5 MG tablet Take 2.5 mg by mouth daily.   amoxicillin (AMOXIL) 500 MG capsule Take 4 capsules by mouth as needed (4 capsules prior to dental appointment).    Ascorbic Acid (VITAMIN C) 1000 MG tablet Take 1,000 mg by mouth daily.   atorvastatin (LIPITOR) 10 MG tablet Take 10 mg by mouth daily at 6 PM.    b complex vitamins capsule Take 1 capsule by mouth daily.   Biotin 5000 MCG TABS Take 5,000 mcg by mouth once a week.   calcium citrate-vitamin D (CITRACAL+D) 315-200 MG-UNIT tablet Take 1 tablet by mouth at bedtime.   Cholecalciferol (VITAMIN D) 50 MCG (2000 UT) CAPS Take 2,000 Units by mouth daily.   docusate sodium (COLACE) 250 MG capsule Take 250 mg by mouth daily as needed for constipation.   ferrous sulfate 325 (65 FE) MG tablet Take 325 mg by mouth once a week.   furosemide (LASIX) 20 MG tablet Take 1 tablet (20 mg total) by  mouth daily as needed for edema (Takes when not leaving the house).   furosemide (LASIX) 40 MG tablet Take 1 tablet (40 mg total) by mouth 2 (two) times daily. Take 1 tablet twice a day for 7 days then resume 20 mg daily   levothyroxine (SYNTHROID) 25 MCG tablet Take 25 mcg by mouth daily before breakfast.   Magnesium 400 MG CAPS Take 400 mg by mouth 2 (two) times daily. Triple complex   omega-3 acid ethyl esters (LOVAZA) 1 g capsule Take 1 g by mouth daily.   OVER THE COUNTER MEDICATION Take 1 capsule by mouth daily. uric acid cleanse   OVER THE COUNTER MEDICATION Take 1 capsule by mouth 2 (two) times  daily. leg vein essentials   OVER THE COUNTER MEDICATION Place 1 drop into both eyes daily as needed (dry eye).   OVER THE COUNTER MEDICATION Take 1 capsule by mouth 3 (three) times daily with meals. LipoSan Ultra Chitosan   Polyethyl Glycol-Propyl Glycol (SYSTANE) 0.4-0.3 % SOLN Place 1 drop into both eyes daily as needed (Dry eye).   polyethylene glycol (MIRALAX / GLYCOLAX) 17 g packet Take 17 g by mouth daily.   TART CHERRY PO Take 3,000 mg by mouth daily.   valsartan (DIOVAN) 160 MG tablet Take 1 tablet (160 mg total) by mouth daily.   Varenicline Tartrate (TYRVAYA) 0.03 MG/ACT SOLN Place 1 spray into both nostrils daily as needed (Allergies).   warfarin (JANTOVEN) 5 MG tablet Take 0.5-1 tablets (2.5-5 mg total) by mouth See admin instructions. Take 2.5 mg on Tuesday and Thursday. Take 5 mg all the other days   zinc gluconate 50 MG tablet Take 50 mg by mouth daily.     Allergies:   Codeine and Sudafed [pseudoephedrine hcl]   Social History   Socioeconomic History   Marital status: Married    Spouse name: Not on file   Number of children: Not on file   Years of education: Not on file   Highest education level: Not on file  Occupational History   Not on file  Tobacco Use   Smoking status: Never   Smokeless tobacco: Never  Substance and Sexual Activity   Alcohol use: No   Drug use: No   Sexual activity: Not on file  Other Topics Concern   Not on file  Social History Narrative   Not on file   Social Determinants of Health   Financial Resource Strain: Not on file  Food Insecurity: Not on file  Transportation Needs: Not on file  Physical Activity: Not on file  Stress: Not on file  Social Connections: Not on file     Family History: The patient's family history includes Heart failure in her sister.  ROS:   Please see the history of present illness.     All other systems reviewed and are negative.  EKGs/Labs/Other Studies Reviewed:    The following studies were  reviewed today:    1. Left ventricular ejection fraction, by estimation, is 60 to 65%. The  left ventricle has normal function. The left ventricle has no regional  wall motion abnormalities.   2. Right ventricular systolic function is normal. The right ventricular  size is normal.   3. Left atrial size was moderately dilated. No left atrial/left atrial  appendage thrombus was detected. The LAA emptying velocity was 20 cm/s.   4. Right atrial size was mildly dilated.   5. The mitral valve is normal in structure. Moderate mitral valve  regurgitation with  2 prominent jets. No evidence of mitral stenosis.  Moderate mitral annular calcification.   6. Tricuspid valve regurgitation is moderate.   7. The aortic valve has been repaired/replaced. Aortic valve  regurgitation is not visualized. No aortic stenosis is present. There is a  mechanical valve present in the aortic position.   8. There is mild (Grade II) layered plaque involving the ascending aorta,  aortic root and descending aorta.   9. The inferior vena cava is normal in size with greater than 50%  respiratory variability, suggesting right atrial pressure of 3 mmHg.   Conclusion(s)/Recommendation(s): Normal biventricular function moderate  mitral regurgitation with 2 prominent jets. No LA or LAA thrombus. No  LA/LAA thrombus identified. Successful cardioversion performed with  restoration of normal sinus rhythm.   EKG:  EKG is  ordered today.  The ekg ordered today demonstrates atrial fibrillation 121 bpm narrow complex QRS QTC 434 prior EKG from last week NSR 81  Recent Labs: 08/16/2021: BUN 19; Creatinine, Ser 0.80; Hemoglobin 12.6; Potassium 4.3; Sodium 140  Recent Lipid Panel    Component Value Date/Time   TRIG 105 04/01/2020 1431     Risk Assessment/Calculations:          Physical Exam:    VS:  BP 120/70 (BP Location: Left Arm, Patient Position: Sitting, Cuff Size: Normal)   Pulse 80   Ht 5\' 1"  (1.549 m)   Wt  150 lb (68 kg)   SpO2 97%   BMI 28.34 kg/m     Wt Readings from Last 3 Encounters:  09/01/21 150 lb (68 kg)  08/25/21 161 lb (73 kg)  08/24/21 154 lb 15.7 oz (70.3 kg)     GEN:  Well nourished, well developed in no acute distress HEENT: Normal NECK: No JVD; No carotid bruits LYMPHATICS: No lymphadenopathy CARDIAC: Irregularly irregular tachycardic, 2/6 holosystolic murmur, no rubs, gallops RESPIRATORY:  Clear to auscultation without rales, wheezing or rhonchi  ABDOMEN: Soft, non-tender, non-distended MUSCULOSKELETAL:  No edema; No deformity  SKIN: Warm and dry NEUROLOGIC:  Alert and oriented x 3 PSYCHIATRIC:  Normal affect   ASSESSMENT:    1. Paroxysmal atrial fibrillation (HCC)   2. H/O mechanical aortic valve replacement   3. Chronic diastolic heart failure (HCC)   4. Moderate mitral regurgitation   5. Essential hypertension   6. Encounter for therapeutic drug monitoring     PLAN:    In order of problems listed above:  H/O mechanical aortic valve replacement Mechanical valve replaced 1999, on chronic Coumadin use.  Dental prophylaxis.  Paroxysmal atrial fibrillation (HCC) Atrial fibrillation has once again returned.  This is likely contributing to her symptoms of gasping, shortness of breath, diastolic heart failure.  Because of this, and recent failure of cardioversion, we will go ahead and place her on amiodarone 200 mg twice a day gentle load.  She does not wish to go to the hospital because her husband is debilitated and needs her assistance at home.  We will do this carefully.  At 1 month, we will see her back and hopefully this will chemically cardiovert.  If not, we will go ahead and set her up again for cardioversion.  Chronic diastolic heart failure (HCC) Lets continue with furosemide.  Restoration of sinus rhythm should help.  Moderate mitral regurgitation Noted once again on TEE.  Playing a role also in her symptomatology.  This is not severe however.    We will check lab work today.  She knows that if symptoms worsen or  become worrisome, we may need to hospitalize.     Medication Adjustments/Labs and Tests Ordered: Current medicines are reviewed at length with the patient today.  Concerns regarding medicines are outlined above.  Orders Placed This Encounter  Procedures   Comprehensive metabolic panel   T4, free   TSH   EKG 12-Lead    Meds ordered this encounter  Medications   amiodarone (PACERONE) 200 MG tablet    Sig: Take 1 tablet (200 mg total) by mouth 2 (two) times daily.    Dispense:  180 tablet    Refill:  3     Patient Instructions  Medication Instructions:  Please start Amiodarone 200 mg twice a day. Continue all other medications as listed.  *If you need a refill on your cardiac medications before your next appointment, please call your pharmacy*  Lab Work: Please have blood work today (CMP, TSH and Free T4)  If you have labs (blood work) drawn today and your tests are completely normal, you will receive your results only by: MyChart Message (if you have MyChart) OR A paper copy in the mail If you have any lab test that is abnormal or we need to change your treatment, we will call you to review the results.  Follow-Up: At [facility], you and your health needs are our priority.  As part of our continuing mission to provide you with exceptional heart care, we have created designated Provider Care Teams.  These Care Teams include your primary Cardiologist (physician) and Advanced Practice Providers (APPs -  Physician Assistants and Nurse Practitioners) who all work together to provide you with the care you need, when you need it.  We recommend signing up for the patient portal called "MyChart".  Sign up information is provided on this After Visit Summary.  MyChart is used to connect with patients for Virtual Visits (Telemedicine).  Patients are able to view lab/test results, encounter notes, upcoming  appointments, etc.  Non-urgent messages can be sent to your provider as well.   To learn more about what you can do with MyChart, go to [email or web address].    Your next appointment:   1 month(s)  The format for your next appointment:   In Person  Provider:   Donato Schultz, MD   Thank you for choosing [facility]!!     Signed, Donato Schultz, MD  09/01/2021 10:12 AM    [facility]

## 2021-09-01 NOTE — Assessment & Plan Note (Signed)
Lets continue with furosemide.  Restoration of sinus rhythm should help.

## 2021-09-01 NOTE — Telephone Encounter (Signed)
Amiodarone -- this would be the best medicine for you to take to try to maintain normal rhythm.  I think it is a reasonable medicine for you.  In fact, I did discuss with one of our electrophysiologists who agrees.  We will monitor for any signs of side effects.  We have many patients on this medication.  Donato Schultz, MD

## 2021-09-01 NOTE — Assessment & Plan Note (Signed)
Mechanical valve replaced 1999, on chronic Coumadin use.  Dental prophylaxis.

## 2021-09-01 NOTE — Telephone Encounter (Signed)
Spoke with patient and reviewed her concerns after reading all possible site effects and Dr Anne Fu comments and recommendations.  She is concerned about the possibility of losing her eye site because she already has poor eye site.  She is concerned the medication may cause problems with her thyroid.  Advised pt Dr Anne Fu will be monitoring her closely.  She is going to discuss the medication with her eye doctor.  She states she will take the medication for 1 week and see how she does.

## 2021-09-01 NOTE — Telephone Encounter (Signed)
Pt c/o medication issue:  1. Name of Medication: amiodarone (PACERONE) 200 MG tablet  2. How are you currently taking this medication (dosage and times per day)? Has only taken 1 tablet  3. Are you having a reaction (difficulty breathing--STAT)? no  4. What is your medication issue? Patient is concerned after reading the side effects of her amiodarone. She says she took the first dos about an hour ago and has no new symptoms since taking it, but is scared to take it again. She says it has an 8 page list of side effects and diagnosis that should avoid the medication. She says it says to avoid if you have thyroid problems and she does. She says it says if you have eye problems you could lose your eye sight, and she does have eye problems. She says it also says it could cause SOB and she already gets SOB. She states she does not want to take it unless Dr. Anne Fu is sure she should.

## 2021-09-01 NOTE — Telephone Encounter (Signed)
Pt has read the side effects of amiodarone and does not want to take it unless Dr Anne Fu is sure she should.

## 2021-09-01 NOTE — Patient Instructions (Signed)
Medication Instructions:  Please start Amiodarone 200 mg twice a day. Continue all other medications as listed.  *If you need a refill on your cardiac medications before your next appointment, please call your pharmacy*  Lab Work: Please have blood work today (CMP, TSH and Free T4)  If you have labs (blood work) drawn today and your tests are completely normal, you will receive your results only by: MyChart Message (if you have MyChart) OR A paper copy in the mail If you have any lab test that is abnormal or we need to change your treatment, we will call you to review the results.  Follow-Up: At [facility], you and your health needs are our priority.  As part of our continuing mission to provide you with exceptional heart care, we have created designated Provider Care Teams.  These Care Teams include your primary Cardiologist (physician) and Advanced Practice Providers (APPs -  Physician Assistants and Nurse Practitioners) who all work together to provide you with the care you need, when you need it.  We recommend signing up for the patient portal called "MyChart".  Sign up information is provided on this After Visit Summary.  MyChart is used to connect with patients for Virtual Visits (Telemedicine).  Patients are able to view lab/test results, encounter notes, upcoming appointments, etc.  Non-urgent messages can be sent to your provider as well.   To learn more about what you can do with MyChart, go to [email or web address].    Your next appointment:   1 month(s)  The format for your next appointment:   In Person  Provider:   Donato Schultz, MD   Thank you for choosing [facility]!!

## 2021-09-02 NOTE — Telephone Encounter (Signed)
Pt c/o medication issue:  1. Name of Medication:   amiodarone (PACERONE) 200 MG tablet  2. How are you currently taking this medication (dosage and times per day)?   3. Are you having a reaction (difficulty breathing--STAT)?   4. What is your medication issue?   Patient is following up, requesting to speak with Dr. Anne Fu' nurse. She states she has still been reading up on the side effects of Amiodarone and she is terrified to take it. She states, again, she would like to discuss alternatives.

## 2021-09-02 NOTE — Telephone Encounter (Signed)
Called patient back and discussed that this message had already been forwarded to Dr. Anne Fu from our conversation early when given lab results.  Verbalized understanding.   Message sent with lab results: Patient stated she was not starting this medication until she talks to Dr. Anne Fu again about the medication because she is scared to take it based on her research.

## 2021-09-05 ENCOUNTER — Telehealth: Payer: Self-pay | Admitting: Cardiology

## 2021-09-05 DIAGNOSIS — I4891 Unspecified atrial fibrillation: Secondary | ICD-10-CM | POA: Diagnosis not present

## 2021-09-05 DIAGNOSIS — R000 Tachycardia, unspecified: Secondary | ICD-10-CM | POA: Diagnosis not present

## 2021-09-05 DIAGNOSIS — R058 Other specified cough: Secondary | ICD-10-CM | POA: Diagnosis not present

## 2021-09-05 NOTE — Telephone Encounter (Signed)
Spoke with pt and advised of Dr Anne Fu orders to be referred to EP.  Advised she will be contacted to be scheduled.  She states understanding.

## 2021-09-05 NOTE — Telephone Encounter (Signed)
Reviewed information with Dr Anne Fu who give verbal order for pt to be referred to EP to discuss other possible options. Order placed.

## 2021-09-05 NOTE — Addendum Note (Signed)
Addended by: Sharin Grave on: 09/05/2021 08:49 AM   Modules accepted: Orders

## 2021-09-05 NOTE — Telephone Encounter (Signed)
Follow Up:     Patient says she is so scared to take the Amiodarone, because of the  terrible side effects it is supposed to have.. She  said she wants to know why Dr Anne Fu put her on this medicine?   Pt c/o medication issue:  1. Name of Medication: Amiodarone  2. How are you currently taking this medication (dosage and times per day)? 2 times  a day  3. Are you having a reaction (difficulty breathing--STAT)? no  4. What is your medication issue? Just afraid of sides she could have

## 2021-09-06 ENCOUNTER — Telehealth: Payer: Self-pay | Admitting: Cardiology

## 2021-09-06 NOTE — Telephone Encounter (Signed)
Patient is calling in wanting to speak to the nurse in regards the info she received from her PCP on yesterday. She wants to know let the dr know the meds she is on and what was said. Patient ask that she gets a call back after 2. Please advise

## 2021-09-07 NOTE — Telephone Encounter (Signed)
Called pt back after 2 pm as requested. Pt wanted to report she had seen her PCP who started her on cefuroxime axatil x 7days for her cold/allergy like s/s.  During that office visit the PCP advised her her HR was 133 but "not in At Fib"  Advised pt that At Fib can not always be determined by just listening and she would need an EKG.  Advised at last office visit here she did receive an EKG which demonstrated At Fib with a HR of 121.  Pt states she has spoken with her eye doctor requiring her vision concerns.  Eye doctor said she will monitor her vision closely.  Pt has been taking Amiodarone twice a day now for several days.  Pt reports when she picked the medication up she received an 8 page long printout of all the things to be scared of and then looked on the computer and found 18 more pages.  She would like to know why she wasn't start on a milder medication 1st instead of something so harsh.  She states she is terrified of having to take in because she read it was used only in life threatening emergencies.  She also expressed displeasure that the medication and it's possible side effects where not discuss with her by her doctor before she was started on it.  She would have been able to discuss and have her concerns addressed at the time.  Reassurance given to pt that while this is a specialized medication that does have many possible side effects she will be monitored closely and she has been referred to EP to discuss other options where it maybe possible for her not to have to take this medication at all.  Advised pt I will forward this information to Dr Anne Fu for his knowledge.  Pt is scheduled to see Dr Lalla Brothers 11/22 and is on a cancellation list for an earlier appointment should one become available.  Pt expressed gratitude for the call back and information.

## 2021-09-07 NOTE — Telephone Encounter (Signed)
Thank you for the update. I called Ms. Kington and discussed the amiodarone with her, all potential side effects and monitoring.  I fully understand that this medication can be a worry to take.  She appreciated the call.  We will continue with amiodarone 200 BID for now.  Remember in normal rhythm her heart rate was in the 50's-60's. I explained the potential for pacemaker in the future as well.   She is looking forward to seeing Dr. Lalla Brothers as well.   Donato Schultz, MD

## 2021-09-14 ENCOUNTER — Telehealth: Payer: Self-pay | Admitting: Cardiology

## 2021-09-14 MED ORDER — DILTIAZEM HCL ER COATED BEADS 120 MG PO CP24: 120.0000 mg | ORAL_CAPSULE | Freq: Every day | ORAL | 3 refills | Status: DC

## 2021-09-14 NOTE — Telephone Encounter (Signed)
This information has been reviewed with Dr Anne Fu who states diarrhea,nausea and mouth dryness is more than likely r/t the antibiotic pt was recently taking.  He would prefer she continue taking the Amiodarone for At Fib and start Carizem CD 120 mg daily to assist in rate control.

## 2021-09-14 NOTE — Telephone Encounter (Signed)
Spoke with pt who states she "will no longer take that A M  I O D...medication because it is too strong her me.  Taking 400 mg a day is too much and I am very sensitive to medications"  Pt reports having diarrhea since Saturday and that she has stopped both the Amiodarone and Cefuroxine that Dr Katrinka Blazing put her on.  Advised pt the Cefuroxine is an antibiotic and can cause diarrhea if it disrupts her normal gut bacteria.   Advised of Dr Anne Fu orders to continue Amiodarone as ordered and start Cardizem CD 120 mg daily for rate control.  Pt reports HR has been 108-116 while taking Amiodarone but is now ranging 122-132 bpm.  Advised pt some increased HR could be occurring d/t dehydration r/t recent diarrhea.  She states she will try the Cardizem CD.  She is asking if she can take Kaopectate.  Advised pt she should not and would be better for her to take probiotics to help restore her gut.  Also advised she should always ask her pharmacist about any medications interactions with OTC medications.

## 2021-09-14 NOTE — Telephone Encounter (Signed)
Agree 

## 2021-09-14 NOTE — Telephone Encounter (Signed)
Pt c/o medication issue:  1. Name of Medication: Cefuroxime and  Amiodarone  2. How are you currently taking this medication (dosage and times per day)? Amiodarone 2 times a day - stopped taking this Sunday(09-12-21)   no longer take the Cefuroxime stop taking that on Saturday(09-10-21)rf  3. Are you having a reaction (difficulty breathing--STAT)?  A little short of breath, having some right now  4. What is your medication issue?  Heart rate been high- today it is 132, also diarrhea, nausea, mouth so  dry

## 2021-09-15 ENCOUNTER — Other Ambulatory Visit: Payer: Self-pay

## 2021-09-15 ENCOUNTER — Ambulatory Visit: Payer: Medicare HMO

## 2021-09-15 DIAGNOSIS — Z5181 Encounter for therapeutic drug level monitoring: Secondary | ICD-10-CM

## 2021-09-15 DIAGNOSIS — Z952 Presence of prosthetic heart valve: Secondary | ICD-10-CM

## 2021-09-15 LAB — POCT INR: INR: 3.9 — AB (ref 2.0–3.0)

## 2021-09-15 NOTE — Patient Instructions (Signed)
Hold today and Friday and then Continue taking 1 tablet Daily, except 0.5 tablet Tuesday and Thursday.  INR 2 weeks

## 2021-09-20 DIAGNOSIS — N3281 Overactive bladder: Secondary | ICD-10-CM | POA: Diagnosis not present

## 2021-09-20 DIAGNOSIS — I5032 Chronic diastolic (congestive) heart failure: Secondary | ICD-10-CM | POA: Diagnosis not present

## 2021-09-20 DIAGNOSIS — Z1331 Encounter for screening for depression: Secondary | ICD-10-CM | POA: Diagnosis not present

## 2021-09-20 DIAGNOSIS — D6869 Other thrombophilia: Secondary | ICD-10-CM | POA: Diagnosis not present

## 2021-09-20 DIAGNOSIS — E7800 Pure hypercholesterolemia, unspecified: Secondary | ICD-10-CM | POA: Diagnosis not present

## 2021-09-20 DIAGNOSIS — E039 Hypothyroidism, unspecified: Secondary | ICD-10-CM | POA: Diagnosis not present

## 2021-09-20 DIAGNOSIS — Z952 Presence of prosthetic heart valve: Secondary | ICD-10-CM | POA: Diagnosis not present

## 2021-09-20 DIAGNOSIS — Z1389 Encounter for screening for other disorder: Secondary | ICD-10-CM | POA: Diagnosis not present

## 2021-09-20 DIAGNOSIS — Z0000 Encounter for general adult medical examination without abnormal findings: Secondary | ICD-10-CM | POA: Diagnosis not present

## 2021-09-20 DIAGNOSIS — I4891 Unspecified atrial fibrillation: Secondary | ICD-10-CM | POA: Diagnosis not present

## 2021-09-20 DIAGNOSIS — K5900 Constipation, unspecified: Secondary | ICD-10-CM | POA: Diagnosis not present

## 2021-09-20 DIAGNOSIS — I10 Essential (primary) hypertension: Secondary | ICD-10-CM | POA: Diagnosis not present

## 2021-09-20 DIAGNOSIS — Z7901 Long term (current) use of anticoagulants: Secondary | ICD-10-CM | POA: Diagnosis not present

## 2021-09-23 DIAGNOSIS — H52223 Regular astigmatism, bilateral: Secondary | ICD-10-CM | POA: Diagnosis not present

## 2021-09-27 ENCOUNTER — Ambulatory Visit: Payer: Medicare HMO | Admitting: Cardiology

## 2021-09-27 ENCOUNTER — Telehealth: Payer: Self-pay | Admitting: Pharmacist

## 2021-09-27 ENCOUNTER — Other Ambulatory Visit: Payer: Self-pay

## 2021-09-27 ENCOUNTER — Encounter: Payer: Self-pay | Admitting: Cardiology

## 2021-09-27 ENCOUNTER — Ambulatory Visit: Payer: Medicare HMO | Admitting: *Deleted

## 2021-09-27 VITALS — BP 140/82 | HR 111 | Ht 61.0 in | Wt 154.0 lb

## 2021-09-27 DIAGNOSIS — K921 Melena: Secondary | ICD-10-CM

## 2021-09-27 DIAGNOSIS — I340 Nonrheumatic mitral (valve) insufficiency: Secondary | ICD-10-CM | POA: Diagnosis not present

## 2021-09-27 DIAGNOSIS — Z952 Presence of prosthetic heart valve: Secondary | ICD-10-CM | POA: Diagnosis not present

## 2021-09-27 DIAGNOSIS — I5032 Chronic diastolic (congestive) heart failure: Secondary | ICD-10-CM

## 2021-09-27 DIAGNOSIS — I4819 Other persistent atrial fibrillation: Secondary | ICD-10-CM

## 2021-09-27 DIAGNOSIS — Z5181 Encounter for therapeutic drug level monitoring: Secondary | ICD-10-CM | POA: Diagnosis not present

## 2021-09-27 DIAGNOSIS — I10 Essential (primary) hypertension: Secondary | ICD-10-CM

## 2021-09-27 LAB — POCT INR: INR: 4.5 — AB (ref 2.0–3.0)

## 2021-09-27 MED ORDER — DILTIAZEM HCL ER COATED BEADS 120 MG PO CP24: 120.0000 mg | ORAL_CAPSULE | Freq: Two times a day (BID) | ORAL | 3 refills | Status: DC

## 2021-09-27 NOTE — Progress Notes (Signed)
Electrophysiology Office Note:    Date:  09/27/2021   ID:  Tracy Vargas, DOB 1934-09-02, MRN [id number]  PCP:  Carol Ada, MD  [facility] Cardiologist:  Candee Furbish, MD  [facility] Electrophysiologist:  Vickie Epley, MD   Referring MD: Jerline Pain, MD   Chief Complaint: Atrial fibrillation  History of Present Illness:    Tracy Vargas is a 87 y.o. female who presents for an evaluation of atrial fibrillation at the request of Dr. Marlou Porch. Their medical history includes mechanical aortic valve replacement in 1999 with chronic Coumadin use, chronic diastolic heart failure, hypertension, hyperlipidemia.  The patient last saw Dr. Marlou Porch in clinic September 01, 2021.  This was after a recent cardioversion on August 24, 2021.  At that appointment she was back in atrial fibrillation and symptomatic with signs of decompensated heart failure.  She was prescribed amiodarone 200 mg twice a day but never started the medication because of concerns of potential for off target effects.  She is referred to discuss possible alternative therapies for atrial fibrillation. In total she took 10 days of amiodarone but stopped taking it because she did research online that showed potential for off target effects.  She is most concerned about her history of thyroid trouble and visual problems.    Past Medical History:  Diagnosis Date   Acquired hyperlipoproteinemia    Chronic diastolic heart failure ([place]) [id number]   Diastolic dysfunction    Dyspnea    HTN (hypertension)    Hyperlipidemia    Osteopenia     Past Surgical History:  Procedure Laterality Date   APPENDECTOMY     CARDIAC VALVE REPLACEMENT     CARDIOVERSION N/A 08/24/2021   Procedure: CARDIOVERSION;  Surgeon: Sueanne Margarita, MD;  Location: [facility];  Service: Cardiovascular;  Laterality: N/A;   CATARACT EXTRACTION     ESOPHAGOGASTRODUODENOSCOPY N/A 07/14/2016   Procedure: ESOPHAGOGASTRODUODENOSCOPY (EGD);  Surgeon: Teena Irani, MD;  Location: [facility] ENDOSCOPY;  Service: Endoscopy;  Laterality: N/A;   ROTATOR CUFF REPAIR     TEE WITHOUT CARDIOVERSION N/A 08/24/2021   Procedure: TRANSESOPHAGEAL ECHOCARDIOGRAM (TEE);  Surgeon: Sueanne Margarita, MD;  Location: [facility] ENDOSCOPY;  Service: Cardiovascular;  Laterality: N/A;   TONSILLECTOMY      Current Medications: Current Meds  Medication Sig   Acetylcarnitine HCl (ACETYL L-CARNITINE) 500 MG CAPS Take 500 mg by mouth daily.   Acetylcysteine (NAC 600) 600 MG CAPS Take 600 mg by mouth daily.   alum & mag hydroxide-simeth (MYLANTA MAXIMUM STRENGTH) 400-400-40 MG/5ML suspension Take 15 mLs by mouth every 6 (six) hours as needed for indigestion.   amLODipine (NORVASC) 2.5 MG tablet Take 2.5 mg by mouth daily.   amoxicillin (AMOXIL) 500 MG capsule Take 4 capsules by mouth as needed (4 capsules prior to dental appointment).    Ascorbic Acid (VITAMIN C) 1000 MG tablet Take 1,000 mg by mouth daily.   atorvastatin (LIPITOR) 10 MG tablet Take 10 mg by mouth daily at 6 PM.    b complex vitamins capsule Take 1 capsule by mouth daily.   Biotin 5000 MCG TABS Take 5,000 mcg by mouth once a week.   calcium citrate-vitamin D (CITRACAL+D) 315-200 MG-UNIT tablet Take 1 tablet by mouth at bedtime.   Cholecalciferol (VITAMIN D) 50 MCG (2000 UT) CAPS Take 2,000 Units by mouth daily.   docusate sodium (COLACE) 250 MG capsule Take 250 mg by mouth daily as needed for constipation.   ferrous sulfate 325 (65 FE) MG  tablet Take 325 mg by mouth once a week.   furosemide (LASIX) 20 MG tablet Take 1 tablet (20 mg total) by mouth daily as needed for edema (Takes when not leaving the house).   levothyroxine (SYNTHROID) 25 MCG tablet Take 25 mcg by mouth daily before breakfast.   Magnesium 400 MG CAPS Take 400 mg by mouth 2 (two) times daily. Triple complex   omega-3 acid ethyl esters (LOVAZA) 1 g capsule Take 1 g by mouth daily.   OVER THE COUNTER MEDICATION Take 1 capsule by mouth daily. uric acid  cleanse   OVER THE COUNTER MEDICATION Take 1 capsule by mouth 2 (two) times daily. leg vein essentials   OVER THE COUNTER MEDICATION Place 1 drop into both eyes daily as needed (dry eye).   OVER THE COUNTER MEDICATION Take 1 capsule by mouth 3 (three) times daily with meals. LipoSan Ultra Chitosan   Polyethyl Glycol-Propyl Glycol (SYSTANE) 0.4-0.3 % SOLN Place 1 drop into both eyes daily as needed (Dry eye).   polyethylene glycol (MIRALAX / GLYCOLAX) 17 g packet Take 17 g by mouth daily.   TART CHERRY PO Take 3,000 mg by mouth daily.   valsartan (DIOVAN) 160 MG tablet Take 1 tablet (160 mg total) by mouth daily.   Varenicline Tartrate (TYRVAYA) 0.03 MG/ACT SOLN Place 1 spray into both nostrils daily as needed (Allergies).   warfarin (JANTOVEN) 5 MG tablet Take 0.5-1 tablets (2.5-5 mg total) by mouth See admin instructions. Take 2.5 mg on Tuesday and Thursday. Take 5 mg all the other days   zinc gluconate 50 MG tablet Take 50 mg by mouth daily.   [DISCONTINUED] amiodarone (PACERONE) 200 MG tablet Take 1 tablet (200 mg total) by mouth 2 (two) times daily.   [DISCONTINUED] diltiazem (CARDIZEM CD) 120 MG 24 hr capsule Take 1 capsule (120 mg total) by mouth daily.     Allergies:   Codeine and Sudafed [pseudoephedrine hcl]   Social History   Socioeconomic History   Marital status: Married    Spouse name: Not on file   Number of children: Not on file   Years of education: Not on file   Highest education level: Not on file  Occupational History   Not on file  Tobacco Use   Smoking status: Never   Smokeless tobacco: Never  Substance and Sexual Activity   Alcohol use: No   Drug use: No   Sexual activity: Not on file  Other Topics Concern   Not on file  Social History Narrative   Not on file   Social Determinants of Health   Financial Resource Strain: Not on file  Food Insecurity: Not on file  Transportation Needs: Not on file  Physical Activity: Not on file  Stress: Not on file   Social Connections: Not on file     Family History: The patient's family history includes Heart failure in her sister.  ROS:   Please see the history of present illness.    All other systems reviewed and are negative.  EKGs/Labs/Other Studies Reviewed:    The following studies were reviewed today:  August 24, 2021 transesophageal echo Left ventricular function normal, 60% Right ventricular function normal Moderately dilated left atrium Mildly dilated right atrium Moderate MR Moderate TR Mechanical aortic valve  EKG:  The ekg ordered today demonstrates atrial fibrillation with a rapid ventricular rate of 111 bpm.  Narrow QRS.  QTC is 460 ms.   Recent Labs: 08/16/2021: Hemoglobin 12.6 09/01/2021: ALT 32; BUN 21;  Creatinine, Ser 0.99; Potassium 4.4; Sodium 141; TSH 3.520  Recent Lipid Panel    Component Value Date/Time   TRIG 105 04/01/2020 1431    Physical Exam:    VS:  BP 140/82   Pulse (!) 111   Ht 5\' 1"  (1.549 m)   Wt 154 lb (69.9 kg)   SpO2 97%   BMI 29.10 kg/m     Wt Readings from Last 3 Encounters:  09/27/21 154 lb (69.9 kg)  09/01/21 150 lb (68 kg)  08/25/21 161 lb (73 kg)     GEN:  Well nourished, well developed in no acute distress HEENT: Normal NECK: No JVD; No carotid bruits LYMPHATICS: No lymphadenopathy CARDIAC: Tachycardic, irregularly irregular, no murmurs, rubs, gallops RESPIRATORY:  Clear to auscultation without rales, wheezing or rhonchi  ABDOMEN: Soft, non-tender, non-distended MUSCULOSKELETAL:  No edema; No deformity  SKIN: Warm and dry NEUROLOGIC:  Alert and oriented x 3 PSYCHIATRIC:  Normal affect       ASSESSMENT:    1. Persistent atrial fibrillation (HCC)   2. Primary hypertension   3. Chronic diastolic heart failure (HCC)   4. Moderate mitral regurgitation   5. Gastrointestinal hemorrhage with melena    PLAN:    In order of problems listed above:  #Persistent atrial fibrillation Rapid ventricular rates.  Poorly  rate controlled.  On Coumadin for stroke prophylaxis.  She prefers a rhythm control strategy given fatigue and rapid heart rates.  I discussed using alternative antiarrhythmic drugs such as Tikosyn to help maintain normal rhythm.  I do not think a cardioversion without antiarrhythmic drug is the best option at this point.  This was discussed with the patient.  After our discussion, the patient would like to pursue Tikosyn initiation in the new year.  We will continue with diltiazem for now but increase the dose to 120 mg by mouth twice daily.  #Hypertension Controlled.  Continue to monitor 1-2 times per week with the increased diltiazem dose  #Moderate MR Relatively euvolemic today.  #Chronic diastolic heart failure NYHA class II today.  Rhythm control is indicated as above.  Continue current medical therapy.   Medication Adjustments/Labs and Tests Ordered: Current medicines are reviewed at length with the patient today.  Concerns regarding medicines are outlined above.  Orders Placed This Encounter  Procedures   EKG 12-Lead   Meds ordered this encounter  Medications   diltiazem (CARDIZEM CD) 120 MG 24 hr capsule    Sig: Take 1 capsule (120 mg total) by mouth in the morning and at bedtime.    Dispense:  180 capsule    Refill:  3    Pt wants to be called when RX is ready     Signed, 08/27/21 T. Sheria Lang, MD, [facility], [facility] 09/27/2021 11:03 AM    Electrophysiology [facility]

## 2021-09-27 NOTE — Patient Instructions (Addendum)
Medication Instructions:  Your physician has recommended you make the following change in your medication:    STOP amiodarone  2.   Increase your diltiazem 120 mg-  Take one capsule by mouth twice a day  Lab Work: None ordered. If you have labs (blood work) drawn today and your tests are completely normal, you will receive your results only by: MyChart Message (if you have MyChart) OR A paper copy in the mail If you have any lab test that is abnormal or we need to change your treatment, we will call you to review the results.  Testing/Procedures: None ordered.  Follow-Up: At [facility], you and your health needs are our priority.  As part of our continuing mission to provide you with exceptional heart care, we have created designated Provider Care Teams.  These Care Teams include your primary Cardiologist (physician) and Advanced Practice Providers (APPs -  Physician Assistants and Nurse Practitioners) who all work together to provide you with the care you need, when you need it.  Your next appointment:    You will be scheduled for a dofetilide admission next year!  Tikosyn (Dofetilide) Hospital Admission  Prior to day of admission: Check with drug insurance company for cost of drug to ensure affordability --- Dofetilide 500 mcg twice a day.  GoodRx is an option if insurance copay is unaffordable.  All patients are tested for COVID-19 prior to admission.  No Benadryl is allowed 3 days prior to admission.  Please ensure no missed doses of your anticoagulation (blood thinner) for 3 weeks prior to admission. If a dose is missed please notify our office immediately.  A pharmacist will review all your medications for potential interactions with Tikosyn. If any medication changes are needed prior to admission we will be in touch with you.  If any new medications are started AFTER your admission date is set with Radio producer. Please notify our office immediately so your medication list  can be updated and reviewed by our pharmacist again. On day of admission: Tikosyn initiation requires a 3 night/4 day hospital stay with constant telemetry monitoring. You will have an EKG after each dose of Tikosyn as well as daily lab draws.  If the drug does not convert you to normal rhythm a cardioversion after the 4th dose of Tikosyn.  Afib Clinic office visit on the morning of admission is needed for preliminary labs/ekg.  Time of admission is dependent on bed availability in the hospital. In some instances, you will be sent home until bed is available. Rarely admission can be delayed to the following day if hospital census prevents available beds.  You may bring personal belongings/clothing with you to the hospital. Please leave your suitcase in the car until you arrive in admissions.  Questions please call our office at [phone]

## 2021-09-27 NOTE — Patient Instructions (Addendum)
Description   Do not take any Warfarin today and No Warfarin tomorrow then start taking 1/2 tablet daily except 1 tablet on Monday, Wednesday, and Friday. Recheck INR in 1 week.

## 2021-09-27 NOTE — Telephone Encounter (Signed)
Medication list reviewed in anticipation of upcoming Tikosyn initiation. Patient is not taking any contraindicated or QTc prolonging medications. She does take a lot of supplements and safety/drug interaction information is difficult to find for all of the ingredients in her leg vein supplement and uric acid cleanse supplement though. She also took amiodarone but only for about 10 days. May be worth checking an amiodarone level in about a month just to ensure med has washed out.  Patient is anticoagulated on warfarin and will require 4 weekly therapeutic INRs prior to Tikosyn start. EP note today mentions that pt will go for Tikosyn in the new year. Will need to confirm her Tikosyn admission date to determine when she will need to start her weekly INR checks. She is followed at [facility] Coumadin clinic who will need to be made aware of Tikosyn admit date to coordinate her weekly INR checks.  Patient will need to be counseled to avoid use of Benadryl while on Tikosyn and in the 2-3 days prior to Tikosyn initiation.

## 2021-09-28 ENCOUNTER — Telehealth: Payer: Self-pay | Admitting: Cardiology

## 2021-09-28 NOTE — Telephone Encounter (Addendum)
Since pt states she does not think the appt is necessary with Dr. Anne Fu and the appt may be canceled with Dr. Anne Fu on 12/1; we will see her in the Anticoagulation Clinic on 11/28 before her husband's in office appt. This date is earlier than needed since pt is holding warfarin doses, but since she will already be here we will see. Called pt and she is aware we will see her in the Anticoagulation on 10/03/2021.

## 2021-09-28 NOTE — Telephone Encounter (Signed)
Spoke with the patient who wants to know if she needs to keep her appointment with Dr. Anne Fu on 12/01. Patient saw Dr. Lalla Brothers yesterday in regards to her A Fib and they are going to start her on Tikosyn at the start of next year. Advised patient that she most likely did not need that appointment with Dr. Anne Fu but I will forward her message to his nurse to make sure.  Patient states that she also has an appointment with coumadin clinic that day but she would like to reschedule it to 11/28 because her husband has an appointment that day as well. Advised that I would sent message to coumadin clinic to advise on.

## 2021-09-28 NOTE — Telephone Encounter (Signed)
Pt states that she doesn't see a reason for the appt on 10/06/21 and would like to know if it is ok to cancel... please advise

## 2021-10-03 ENCOUNTER — Telehealth: Payer: Self-pay | Admitting: Cardiology

## 2021-10-03 ENCOUNTER — Other Ambulatory Visit: Payer: Self-pay

## 2021-10-03 ENCOUNTER — Ambulatory Visit: Payer: Medicare HMO | Admitting: *Deleted

## 2021-10-03 DIAGNOSIS — Z5181 Encounter for therapeutic drug level monitoring: Secondary | ICD-10-CM

## 2021-10-03 DIAGNOSIS — Z952 Presence of prosthetic heart valve: Secondary | ICD-10-CM

## 2021-10-03 LAB — POCT INR: INR: 1.7 — AB (ref 2.0–3.0)

## 2021-10-03 NOTE — Telephone Encounter (Signed)
Pt states that her heart is making a squeaky noise on and off.. pt states that she received medical advise from one of her retired healthcare friends who advised the pt that the sound is likely coming from her lungs.... pt would like to know if you would want her to have an xray done on her lungs prior to upcoming appt... please advise

## 2021-10-03 NOTE — Patient Instructions (Signed)
Description   Take 1.5 tablets of warfarin today and then continue taking 1/2 tablet daily except 1 tablet on Monday, Wednesday, and Friday. Recheck INR in 1 week- pending Tikosyn admission. Coumadin Clinic [phone]

## 2021-10-03 NOTE — Telephone Encounter (Signed)
Reviewed with Dr Anne Fu.  Appt scheduled with him on 12/1 is no longer needed and will be cancelled.

## 2021-10-03 NOTE — Telephone Encounter (Signed)
Spoke with pt and advised per Dr Anne Fu she should f/u with PCP regarding possible need for chest X-ray.  She states understanding.

## 2021-10-05 ENCOUNTER — Telehealth: Payer: Self-pay | Admitting: Cardiology

## 2021-10-05 DIAGNOSIS — I4819 Other persistent atrial fibrillation: Secondary | ICD-10-CM

## 2021-10-05 NOTE — Telephone Encounter (Signed)
Patient called about a procedure Dr. Lalla Brothers wants her to have in January.  She states she needs a procedure code, she wants to check with her insurance to make sure it's covered.

## 2021-10-06 ENCOUNTER — Ambulatory Visit: Payer: Medicare HMO | Admitting: Cardiology

## 2021-10-06 ENCOUNTER — Other Ambulatory Visit: Payer: Self-pay

## 2021-10-06 ENCOUNTER — Ambulatory Visit (INDEPENDENT_AMBULATORY_CARE_PROVIDER_SITE_OTHER): Payer: Medicare HMO

## 2021-10-06 ENCOUNTER — Ambulatory Visit
Admission: EM | Admit: 2021-10-06 | Discharge: 2021-10-06 | Disposition: A | Discharge status: Home / Self Care | Payer: Medicare HMO | Attending: Physician Assistant | Admitting: Physician Assistant

## 2021-10-06 DIAGNOSIS — R059 Cough, unspecified: Secondary | ICD-10-CM

## 2021-10-06 DIAGNOSIS — J189 Pneumonia, unspecified organism: Secondary | ICD-10-CM

## 2021-10-06 DIAGNOSIS — K449 Diaphragmatic hernia without obstruction or gangrene: Secondary | ICD-10-CM | POA: Diagnosis not present

## 2021-10-06 DIAGNOSIS — R0602 Shortness of breath: Secondary | ICD-10-CM

## 2021-10-06 DIAGNOSIS — J90 Pleural effusion, not elsewhere classified: Secondary | ICD-10-CM | POA: Diagnosis not present

## 2021-10-06 MED ORDER — AMOXICILLIN-POT CLAVULANATE 875-125 MG PO TABS: 1.0000 | ORAL_TABLET | Freq: Two times a day (BID) | ORAL | 0 refills | Status: DC

## 2021-10-06 MED ORDER — AZITHROMYCIN 250 MG PO TABS: 250.0000 mg | ORAL_TABLET | Freq: Every day | ORAL | 0 refills | Status: DC

## 2021-10-06 NOTE — Telephone Encounter (Signed)
Left message

## 2021-10-06 NOTE — Telephone Encounter (Signed)
Pt given code for inpatient hospital stay. Also per Pharm-D recommendations ordered amiodarone level to be drawn 1 month post amiodarone stop. Pt will go next week when she goes for INR check. Orders placed.

## 2021-10-06 NOTE — ED Provider Notes (Signed)
[facility]    CSN: [id number] Arrival date & time: 10/06/21  0845      History   Chief Complaint Chief Complaint  Patient presents with   Cough    HPI Tracy Vargas is a 87 y.o. female.   Patient is here today for evaluation of cough that has seemed to progress over the last 4 weeks. She reports she has had worsening symptoms over the last week with increased shortness of breath. She has seen her cardiologist who recommended chest xray.   The history is provided by the patient.  Cough Associated symptoms: shortness of breath   Associated symptoms: no chest pain, no chills, no eye discharge and no fever    Past Medical History:  Diagnosis Date   Acquired hyperlipoproteinemia    Chronic diastolic heart failure (HCC) 09/01/2013   Diastolic dysfunction    Dyspnea    HTN (hypertension)    Hyperlipidemia    Osteopenia     Patient Active Problem List   Diagnosis Date Noted   Moderate mitral regurgitation 08/25/2021   Paroxysmal atrial fibrillation (HCC) 08/09/2021   Chronic anticoagulation 08/09/2021   Chronic cough 09/21/2020   Pneumonia due to COVID-19 virus 04/01/2020   Acute respiratory failure with hypoxia (HCC) 04/01/2020   Encounter for therapeutic drug monitoring 07/19/2016   Acute blood loss anemia 07/13/2016   GI bleed 07/12/2016   Chronic diastolic heart failure (HCC) 09/01/2013   H/O mechanical aortic valve replacement 09/01/2013   HTN (hypertension)    Hyperlipidemia    Dyspnea    Acquired hyperlipoproteinemia    Osteopenia     Past Surgical History:  Procedure Laterality Date   APPENDECTOMY     CARDIAC VALVE REPLACEMENT     CARDIOVERSION N/A 08/24/2021   Procedure: CARDIOVERSION;  Surgeon: Quintella Reichert, MD;  Location: [facility] ENDOSCOPY;  Service: Cardiovascular;  Laterality: N/A;   CATARACT EXTRACTION     ESOPHAGOGASTRODUODENOSCOPY N/A 07/14/2016   Procedure: ESOPHAGOGASTRODUODENOSCOPY (EGD);  Surgeon: Dorena Cookey, MD;  Location: [facility]  ENDOSCOPY;  Service: Endoscopy;  Laterality: N/A;   ROTATOR CUFF REPAIR     TEE WITHOUT CARDIOVERSION N/A 08/24/2021   Procedure: TRANSESOPHAGEAL ECHOCARDIOGRAM (TEE);  Surgeon: Quintella Reichert, MD;  Location: [facility] ENDOSCOPY;  Service: Cardiovascular;  Laterality: N/A;   TONSILLECTOMY      OB History   No obstetric history on file.      Home Medications    Prior to Admission medications   Medication Sig Start Date End Date Taking? Authorizing Provider  amoxicillin-clavulanate (AUGMENTIN) 875-125 MG tablet Take 1 tablet by mouth every 12 (twelve) hours. 10/06/21  Yes Tomi Bamberger, PA-C  azithromycin (ZITHROMAX) 250 MG tablet Take 1 tablet (250 mg total) by mouth daily. Take first 2 tablets together, then 1 every day until finished. 10/06/21  Yes Tomi Bamberger, PA-C  Acetylcarnitine HCl (ACETYL L-CARNITINE) 500 MG CAPS Take 500 mg by mouth daily.    [provider]  Acetylcysteine (NAC 600) 600 MG CAPS Take 600 mg by mouth daily.    [provider]  alum & mag hydroxide-simeth (MYLANTA MAXIMUM STRENGTH) 400-400-40 MG/5ML suspension Take 15 mLs by mouth every 6 (six) hours as needed for indigestion.    [provider]  amLODipine (NORVASC) 2.5 MG tablet Take 2.5 mg by mouth daily.    [provider]  amoxicillin (AMOXIL) 500 MG capsule Take 4 capsules by mouth as needed (4 capsules prior to dental appointment).  07/07/19   [provider]  Ascorbic Acid (VITAMIN C) 1000 MG tablet Take 1,000 mg by mouth daily.    [provider]  atorvastatin (LIPITOR) 10 MG tablet Take 10 mg by mouth daily at 6 PM.  09/11/14   [provider]  b complex vitamins capsule Take 1 capsule by mouth daily.    [provider]  Biotin 5000 MCG TABS Take 5,000 mcg by mouth once a week.    [provider]  calcium citrate-vitamin D (CITRACAL+D) 315-200 MG-UNIT tablet Take 1 tablet by mouth at bedtime.    [provider]   Cholecalciferol (VITAMIN D) 50 MCG (2000 UT) CAPS Take 2,000 Units by mouth daily.    [provider]  diltiazem (CARDIZEM CD) 120 MG 24 hr capsule Take 1 capsule (120 mg total) by mouth in the morning and at bedtime. 09/27/21   Vickie Epley, MD  docusate sodium (COLACE) 250 MG capsule Take 250 mg by mouth daily as needed for constipation.    [provider]  ferrous sulfate 325 (65 FE) MG tablet Take 325 mg by mouth once a week.    [provider]  furosemide (LASIX) 20 MG tablet Take 1 tablet (20 mg total) by mouth daily as needed for edema (Takes when not leaving the house). 08/24/21   Sueanne Margarita, MD  levothyroxine (SYNTHROID) 25 MCG tablet Take 25 mcg by mouth daily before breakfast. 12/08/20   [provider]  Magnesium 400 MG CAPS Take 400 mg by mouth 2 (two) times daily. Triple complex    [provider]  omega-3 acid ethyl esters (LOVAZA) 1 g capsule Take 1 g by mouth daily.    [provider]  OVER THE COUNTER MEDICATION Take 1 capsule by mouth daily. uric acid cleanse    [provider]  OVER THE COUNTER MEDICATION Take 1 capsule by mouth 2 (two) times daily. leg vein essentials    [provider]  OVER THE COUNTER MEDICATION Place 1 drop into both eyes daily as needed (dry eye).    [provider]  OVER THE COUNTER MEDICATION Take 1 capsule by mouth 3 (three) times daily with meals. LipoSan Ultra Chitosan    [provider]  Polyethyl Glycol-Propyl Glycol (SYSTANE) 0.4-0.3 % SOLN Place 1 drop into both eyes daily as needed (Dry eye).    [provider]  polyethylene glycol (MIRALAX / GLYCOLAX) 17 g packet Take 17 g by mouth daily.    [provider]  TART CHERRY PO Take 3,000 mg by mouth daily.    [provider]  valsartan (DIOVAN) 160 MG tablet Take 1 tablet (160 mg total) by mouth daily. 12/22/20   Collene Gobble, MD  Varenicline Tartrate (TYRVAYA) 0.03 MG/ACT  SOLN Place 1 spray into both nostrils daily as needed (Allergies).    [provider]  warfarin (JANTOVEN) 5 MG tablet Take 0.5-1 tablets (2.5-5 mg total) by mouth See admin instructions. Take 2.5 mg on Tuesday and Thursday. Take 5 mg all the other days 08/24/21   Sueanne Margarita, MD  zinc gluconate 50 MG tablet Take 50 mg by mouth daily.    [provider]    Family History Family History  Problem Relation Age of Onset   Heart failure Sister     Social History Social History   Tobacco Use   Smoking status: Never   Smokeless tobacco: Never  Substance Use Topics   Alcohol use: No   Drug use: No  Allergies   Codeine and Sudafed [pseudoephedrine hcl]   Review of Systems Review of Systems  Constitutional:  Negative for chills and fever.  Eyes:  Negative for discharge and redness.  Respiratory:  Positive for cough and shortness of breath.   Cardiovascular:  Negative for chest pain.    Physical Exam Triage Vital Signs ED Triage Vitals [10/06/21 0909]  Enc Vitals Group     BP (!) 173/68     Pulse Rate (!) 117     Resp 18     Temp 98 F (36.7 C)     Temp Source Oral     SpO2 96 %     Weight      Height      Head Circumference      Peak Flow      Pain Score 0     Pain Loc      Pain Edu?      Excl. in [place]?    No data found.  Updated Vital Signs BP (!) 173/68 (BP Location: Left Arm)   Pulse (!) 117   Temp 98 F (36.7 C) (Oral)   Resp 18   SpO2 96%   Physical Exam Vitals and nursing note reviewed.  Constitutional:      General: She is not in acute distress.    Appearance: Normal appearance. She is not ill-appearing.  HENT:     Head: Normocephalic and atraumatic.     Nose: Congestion present.  Eyes:     Conjunctiva/sclera: Conjunctivae normal.  Cardiovascular:     Rate and Rhythm: Tachycardia present. Rhythm irregular.  Pulmonary:     Effort: Pulmonary effort is normal. No respiratory distress.     Breath sounds: Rhonchi (minimal  at right lower lung field) present.  Skin:    General: Skin is warm and dry.  Neurological:     Mental Status: She is alert.  Psychiatric:        Mood and Affect: Mood normal.        Thought Content: Thought content normal.     UC Treatments / Results  Labs (all labs ordered are listed, but only abnormal results are displayed) Labs Reviewed - No data to display  EKG   Radiology DG Chest 2 View  Result Date: 10/06/2021 CLINICAL DATA:  And 87 year old female presents for evaluation of cough and shortness of breath. EXAM: CHEST - 2 VIEW COMPARISON:  Comparison with September 21, 2020. FINDINGS: Post median sternotomy and aortic valve replacement. Abundant calcifications project over the heart associated with the mitral annulus and LEFT ventricle on previous CT. Cardiomediastinal contours are stable. Hilar structures also stable with respect to visualized portions. RIGHT infrahilar region partially obscured by airspace disease and effusion silhouetting the RIGHT hemidiaphragm in the RIGHT lower chest. No additional signs of airspace process. Increased density behind the heart compatible with hiatal hernia. Signs of aortic atherosclerosis. No acute bony abnormality on limited assessment. IMPRESSION: Findings suspicious for RIGHT lower lobe pneumonia potentially associated with small effusion. Suggest follow-up in 6-8 weeks following therapy to ensure clearing. Signs of hiatal hernia. Signs of aortic valve replacement and mitral annular calcifications. Calcifications in the upper abdomen compatible with peripherally calcified splenic artery aneurysm seen on previous CT as well. Electronically Signed   By: Zetta Bills M.D.   On: 10/06/2021 09:33    Procedures Procedures (including critical care time)  Medications Ordered in UC Medications - No data to display  Initial Impression / Assessment and Plan / UC  Course  I have reviewed the triage vital signs and the nursing notes.  Pertinent  labs & imaging results that were available during my care of the patient were reviewed by me and considered in my medical decision making (see chart for details).    Augmentin and Zpak prescribed for CAP. Recommended repeat CXR in 4-6 weeks or sooner follow up if symptoms do not improve with treatment. Discussed follow up with cardiology regarding INR--  may need to have it checked sooner given rx of azithromycin. Patient expresses understanding.   Final Clinical Impressions(s) / UC Diagnoses   Final diagnoses:  Community acquired pneumonia of right lower lobe of lung   Discharge Instructions   None    ED Prescriptions     Medication Sig Dispense Auth. Provider   amoxicillin-clavulanate (AUGMENTIN) 875-125 MG tablet Take 1 tablet by mouth every 12 (twelve) hours. 14 tablet Ewell Poe F, PA-C   azithromycin (ZITHROMAX) 250 MG tablet Take 1 tablet (250 mg total) by mouth daily. Take first 2 tablets together, then 1 every day until finished. 6 tablet Francene Finders, PA-C      PDMP not reviewed this encounter.   Francene Finders, PA-C 10/06/21 1028

## 2021-10-06 NOTE — ED Triage Notes (Signed)
Pt c/o productive cough with yellow sputum for over a month and having increase SOB in the past week. No distress noted.

## 2021-10-10 NOTE — Telephone Encounter (Signed)
Patient called with update - after talking with her insurance on costs for hospitalization for tikosyn loading she has decided she cannot afford the copay for each day in the hospital and does not want to proceed with Tikosyn. Her HR is in the 80-90s and she is currently finishing out her prescription for pneumonia treatment. Instructed patient I will inform Dr. Lalla Brothers and his RN regarding her decision and their office will contact her for next steps. Patient in agreement.

## 2021-10-12 ENCOUNTER — Other Ambulatory Visit: Payer: Self-pay

## 2021-10-12 ENCOUNTER — Ambulatory Visit: Payer: Medicare HMO

## 2021-10-12 DIAGNOSIS — Z5181 Encounter for therapeutic drug level monitoring: Secondary | ICD-10-CM

## 2021-10-12 DIAGNOSIS — Z952 Presence of prosthetic heart valve: Secondary | ICD-10-CM | POA: Diagnosis not present

## 2021-10-12 LAB — POCT INR: INR: 2.3 (ref 2.0–3.0)

## 2021-10-12 NOTE — Patient Instructions (Signed)
continue taking 1/2 tablet daily except 1 tablet on Monday, Wednesday, and Friday. Recheck INR in 4 weeks. [facility] [phone]

## 2021-10-17 NOTE — Telephone Encounter (Signed)
Called and spoke with patient, she is agreeable to appt 10/26/21 with Rudi Coco, NP.

## 2021-10-21 ENCOUNTER — Ambulatory Visit: Payer: Medicare HMO

## 2021-10-21 ENCOUNTER — Other Ambulatory Visit: Payer: Self-pay

## 2021-10-21 ENCOUNTER — Ambulatory Visit
Admission: RE | Admit: 2021-10-21 | Discharge: 2021-10-21 | Disposition: A | Discharge status: Home / Self Care | Payer: Medicare HMO | Source: Ambulatory Visit | Attending: Internal Medicine | Admitting: Internal Medicine

## 2021-10-21 ENCOUNTER — Ambulatory Visit (INDEPENDENT_AMBULATORY_CARE_PROVIDER_SITE_OTHER): Payer: Medicare HMO

## 2021-10-21 VITALS — BP 148/81 | HR 103 | Temp 97.6°F | Resp 18

## 2021-10-21 DIAGNOSIS — R0981 Nasal congestion: Secondary | ICD-10-CM | POA: Diagnosis not present

## 2021-10-21 DIAGNOSIS — J90 Pleural effusion, not elsewhere classified: Secondary | ICD-10-CM | POA: Diagnosis not present

## 2021-10-21 DIAGNOSIS — J189 Pneumonia, unspecified organism: Secondary | ICD-10-CM

## 2021-10-21 MED ORDER — FLUTICASONE PROPIONATE 50 MCG/ACT NA SUSP: 1.0000 | Freq: Every day | NASAL | 0 refills | Status: DC

## 2021-10-21 NOTE — ED Triage Notes (Signed)
Pt states she was recently here with PNA and given z-pak. States she was instructed to return here post tx for a repeat xray to verify cure. Continues to experience some symptoms like nasal and sinus congestion.

## 2021-10-21 NOTE — ED Provider Notes (Signed)
[facility]    CSN: [id number] Arrival date & time: 10/21/21  [id number]      History   Chief Complaint Chief Complaint  Patient presents with   Cough    HPI Tracy Vargas is a 87 y.o. female.   Patient presents today for follow-up after being seen on 10/06/2021.  Patient was seen on 10/06/2021 and had a chest x-ray that showed right lower lobe community-acquired pneumonia.  She was prescribed azithromycin and Augmentin.  She reports that nasal congestion and sinus pressure are still persistent.  Denies any fevers, chest pain, shortness of breath, sore throat, ear pain, nausea, vomiting, diarrhea, abdominal pain.  She reports that she took the medications as prescribed.  She was told to follow-up for repeat chest x-ray in a few weeks or sooner if symptoms did not resolve.   Cough  Past Medical History:  Diagnosis Date   Acquired hyperlipoproteinemia    Chronic diastolic heart failure ([place]) [id number]   Diastolic dysfunction    Dyspnea    HTN (hypertension)    Hyperlipidemia    Osteopenia     Patient Active Problem List   Diagnosis Date Noted   Moderate mitral regurgitation 08/25/2021   Paroxysmal atrial fibrillation ([place]) 08/09/2021   Chronic anticoagulation 08/09/2021   Chronic cough 09/21/2020   Pneumonia due to COVID-19 virus 04/01/2020   Acute respiratory failure with hypoxia ([place]) 04/01/2020   Encounter for therapeutic drug monitoring 07/19/2016   Acute blood loss anemia 07/13/2016   GI bleed 07/12/2016   Chronic diastolic heart failure ([place]) 09/01/2013   H/O mechanical aortic valve replacement 09/01/2013   HTN (hypertension)    Hyperlipidemia    Dyspnea    Acquired hyperlipoproteinemia    Osteopenia     Past Surgical History:  Procedure Laterality Date   APPENDECTOMY     CARDIAC VALVE REPLACEMENT     CARDIOVERSION N/A 08/24/2021   Procedure: CARDIOVERSION;  Surgeon: Sueanne Margarita, MD;  Location: [place];  Service: Cardiovascular;   Laterality: N/A;   CATARACT EXTRACTION     ESOPHAGOGASTRODUODENOSCOPY N/A 07/14/2016   Procedure: ESOPHAGOGASTRODUODENOSCOPY (EGD);  Surgeon: Teena Irani, MD;  Location: [facility] ENDOSCOPY;  Service: Endoscopy;  Laterality: N/A;   ROTATOR CUFF REPAIR     TEE WITHOUT CARDIOVERSION N/A 08/24/2021   Procedure: TRANSESOPHAGEAL ECHOCARDIOGRAM (TEE);  Surgeon: Sueanne Margarita, MD;  Location: [facility] ENDOSCOPY;  Service: Cardiovascular;  Laterality: N/A;   TONSILLECTOMY      OB History   No obstetric history on file.      Home Medications    Prior to Admission medications   Medication Sig Start Date End Date Taking? Authorizing Provider  fluticasone (FLONASE) 50 MCG/ACT nasal spray Place 1 spray into both nostrils daily for 3 days. 10/21/21 10/24/21 Yes , Michele Rockers, FNP  Acetylcarnitine HCl (ACETYL L-CARNITINE) 500 MG CAPS Take 500 mg by mouth daily.    [provider]  Acetylcysteine (NAC 600) 600 MG CAPS Take 600 mg by mouth daily.    [provider]  alum & mag hydroxide-simeth (MYLANTA MAXIMUM STRENGTH) 400-400-40 MG/5ML suspension Take 15 mLs by mouth every 6 (six) hours as needed for indigestion.    [provider]  amLODipine (NORVASC) 2.5 MG tablet Take 2.5 mg by mouth daily.    [provider]  amoxicillin (AMOXIL) 500 MG capsule Take 4 capsules by mouth as needed (4 capsules prior to dental appointment).  07/07/19   [provider]  amoxicillin-clavulanate (AUGMENTIN) 875-125 MG tablet Take  1 tablet by mouth every 12 (twelve) hours. 10/06/21   Tomi Bamberger, PA-C  Ascorbic Acid (VITAMIN C) 1000 MG tablet Take 1,000 mg by mouth daily.    [provider]  atorvastatin (LIPITOR) 10 MG tablet Take 10 mg by mouth daily at 6 PM.  09/11/14   [provider]  azithromycin (ZITHROMAX) 250 MG tablet Take 1 tablet (250 mg total) by mouth daily. Take first 2 tablets together, then 1 every day until finished. 10/06/21   Tomi Bamberger, PA-C  b  complex vitamins capsule Take 1 capsule by mouth daily.    [provider]  Biotin 5000 MCG TABS Take 5,000 mcg by mouth once a week.    [provider]  calcium citrate-vitamin D (CITRACAL+D) 315-200 MG-UNIT tablet Take 1 tablet by mouth at bedtime.    [provider]  Cholecalciferol (VITAMIN D) 50 MCG (2000 UT) CAPS Take 2,000 Units by mouth daily.    [provider]  diltiazem (CARDIZEM CD) 120 MG 24 hr capsule Take 1 capsule (120 mg total) by mouth in the morning and at bedtime. 09/27/21   Lanier Prude, MD  docusate sodium (COLACE) 250 MG capsule Take 250 mg by mouth daily as needed for constipation.    [provider]  ferrous sulfate 325 (65 FE) MG tablet Take 325 mg by mouth once a week.    [provider]  furosemide (LASIX) 20 MG tablet Take 1 tablet (20 mg total) by mouth daily as needed for edema (Takes when not leaving the house). 08/24/21   Quintella Reichert, MD  levothyroxine (SYNTHROID) 25 MCG tablet Take 25 mcg by mouth daily before breakfast. 12/08/20   [provider]  Magnesium 400 MG CAPS Take 400 mg by mouth 2 (two) times daily. Triple complex    [provider]  omega-3 acid ethyl esters (LOVAZA) 1 g capsule Take 1 g by mouth daily.    [provider]  OVER THE COUNTER MEDICATION Take 1 capsule by mouth daily. uric acid cleanse    [provider]  OVER THE COUNTER MEDICATION Take 1 capsule by mouth 2 (two) times daily. leg vein essentials    [provider]  OVER THE COUNTER MEDICATION Place 1 drop into both eyes daily as needed (dry eye).    [provider]  OVER THE COUNTER MEDICATION Take 1 capsule by mouth 3 (three) times daily with meals. LipoSan Ultra Chitosan    [provider]  Polyethyl Glycol-Propyl Glycol (SYSTANE) 0.4-0.3 % SOLN Place 1 drop into both eyes daily as needed (Dry eye).    [provider]  polyethylene glycol (MIRALAX /  GLYCOLAX) 17 g packet Take 17 g by mouth daily.    [provider]  TART CHERRY PO Take 3,000 mg by mouth daily.    [provider]  valsartan (DIOVAN) 160 MG tablet Take 1 tablet (160 mg total) by mouth daily. 12/22/20   Leslye Peer, MD  Varenicline Tartrate (TYRVAYA) 0.03 MG/ACT SOLN Place 1 spray into both nostrils daily as needed (Allergies).    [provider]  warfarin (JANTOVEN) 5 MG tablet Take 0.5-1 tablets (2.5-5 mg total) by mouth See admin instructions. Take 2.5 mg on Tuesday and Thursday. Take 5 mg all the other days 08/24/21   Quintella Reichert, MD  zinc gluconate 50 MG tablet Take 50 mg by mouth daily.    [provider]    Family History Family History  Problem Relation Age of Onset   Heart failure Sister     Social History Social History   Tobacco Use   Smoking status: Never   Smokeless tobacco: Never  Substance Use Topics   Alcohol use: No   Drug use: No     Allergies   Codeine and Sudafed [pseudoephedrine hcl]   Review of Systems Review of Systems Per HPI  Physical Exam Triage Vital Signs ED Triage Vitals  Enc Vitals Group     BP 10/21/21 0849 (!) 148/81     Pulse Rate 10/21/21 0849 (!) 103     Resp 10/21/21 0849 18     Temp 10/21/21 0849 97.6 F (36.4 C)     Temp Source 10/21/21 0849 Oral     SpO2 10/21/21 0849 97 %     Weight --      Height --      Head Circumference --      Peak Flow --      Pain Score 10/21/21 0850 0     Pain Loc --      Pain Edu? --      Excl. in [place]? --    No data found.  Updated Vital Signs BP (!) 148/81 (BP Location: Right Arm)    Pulse (!) 103    Temp 97.6 F (36.4 C) (Oral)    Resp 18    SpO2 97%   Visual Acuity Right Eye Distance:   Left Eye Distance:   Bilateral Distance:    Right Eye Near:   Left Eye Near:    Bilateral Near:     Physical Exam Constitutional:      General: She is not in acute distress.    Appearance: Normal appearance. She is not toxic-appearing  or diaphoretic.  HENT:     Head: Normocephalic and atraumatic.     Right Ear: Tympanic membrane and ear canal normal.     Left Ear: Tympanic membrane and ear canal normal.     Nose: Congestion present.     Mouth/Throat:     Mouth: Mucous membranes are moist.     Pharynx: No posterior oropharyngeal erythema.  Eyes:     Extraocular Movements: Extraocular movements intact.     Conjunctiva/sclera: Conjunctivae normal.     Pupils: Pupils are equal, round, and reactive to light.  Cardiovascular:     Rate and Rhythm: Normal rate and regular rhythm.     Pulses: Normal pulses.     Heart sounds: Normal heart sounds.  Pulmonary:     Effort: Pulmonary effort is normal. No respiratory distress.     Breath sounds: Normal breath sounds. No stridor. No wheezing, rhonchi or rales.  Abdominal:     General: Abdomen is flat. Bowel sounds are normal.     Palpations: Abdomen is soft.  Musculoskeletal:        General: Normal range of motion.     Cervical back: Normal range of motion.  Skin:    General: Skin is warm and dry.  Neurological:     General: No focal deficit present.     Mental Status: She is alert and oriented to person, place, and time. Mental status is at baseline.  Psychiatric:        Mood and Affect: Mood normal.        Behavior: Behavior normal.     UC Treatments / Results  Labs (all labs ordered are listed, but only abnormal results are displayed) Labs Reviewed - No data  to display  EKG   Radiology DG Chest 2 View  Result Date: 10/21/2021 CLINICAL DATA:  Pneumonia. EXAM: CHEST - 2 VIEW COMPARISON:  October 06, 2021. FINDINGS: Stable cardiomediastinal silhouette. Status post aortic valve repair. Mitral valve calcifications are noted. Large hiatal hernia is noted. Mild right pleural effusion is noted with associated right basilar atelectasis or infiltrate. Bony thorax is unremarkable. IMPRESSION: Large hiatal hernia. Mild right pleural effusion is noted with associated right  basilar atelectasis or infiltrate. Aortic Atherosclerosis (ICD10-I70.0). Electronically Signed   By: Marijo Conception M.D.   On: 10/21/2021 09:23    Procedures Procedures (including critical care time)  Medications Ordered in UC Medications - No data to display  Initial Impression / Assessment and Plan / UC Course  I have reviewed the triage vital signs and the nursing notes.  Pertinent labs & imaging results that were available during my care of the patient were reviewed by me and considered in my medical decision making (see chart for details).     Repeat chest x-ray appears to be improving.  Do not think that additional antibiotic treatment or medication is necessary given chest x-ray.  Nasal symptoms may be related to change in weather.  Discussed supportive care with patient.  Flonase prescribed to help alleviate symptoms.  Do not think the patient is in need of immediate medical attention at the hospital at this time as there are no adventitious lung sounds on exam.  Oxygen is also normal.  Discussed strict return precautions.  Patient verbalized understanding and was agreeable with plan. Final Clinical Impressions(s) / UC Diagnoses   Final diagnoses:  Nasal congestion     Discharge Instructions      Your chest x-ray looks better.  You have been prescribed a nasal spray.      ED Prescriptions     Medication Sig Dispense Auth. Provider   fluticasone (FLONASE) 50 MCG/ACT nasal spray Place 1 spray into both nostrils daily for 3 days. 16 g Teodora Medici, [place]      PDMP not reviewed this encounter.   Teodora Medici, [place] 10/21/21 [phone]

## 2021-10-21 NOTE — Discharge Instructions (Signed)
Your chest x-ray looks better.  You have been prescribed a nasal spray.

## 2021-10-26 ENCOUNTER — Other Ambulatory Visit: Payer: Self-pay

## 2021-10-26 ENCOUNTER — Encounter (HOSPITAL_COMMUNITY): Payer: Self-pay | Admitting: Nurse Practitioner

## 2021-10-26 ENCOUNTER — Ambulatory Visit (HOSPITAL_COMMUNITY)
Admission: RE | Admit: 2021-10-26 | Discharge: 2021-10-26 | Disposition: A | Discharge status: Home / Self Care | Payer: Medicare HMO | Source: Ambulatory Visit

## 2021-10-26 ENCOUNTER — Other Ambulatory Visit (HOSPITAL_COMMUNITY): Payer: Self-pay | Admitting: Nurse Practitioner

## 2021-10-26 ENCOUNTER — Ambulatory Visit (HOSPITAL_COMMUNITY)
Admission: RE | Admit: 2021-10-26 | Discharge: 2021-10-26 | Disposition: A | Discharge status: Home / Self Care | Payer: Medicare HMO | Source: Ambulatory Visit | Attending: Nurse Practitioner | Admitting: Nurse Practitioner

## 2021-10-26 VITALS — BP 118/60 | HR 90 | Ht 61.0 in | Wt 152.0 lb

## 2021-10-26 DIAGNOSIS — I110 Hypertensive heart disease with heart failure: Secondary | ICD-10-CM | POA: Insufficient documentation

## 2021-10-26 DIAGNOSIS — Z952 Presence of prosthetic heart valve: Secondary | ICD-10-CM | POA: Insufficient documentation

## 2021-10-26 DIAGNOSIS — I5032 Chronic diastolic (congestive) heart failure: Secondary | ICD-10-CM | POA: Insufficient documentation

## 2021-10-26 DIAGNOSIS — D6869 Other thrombophilia: Secondary | ICD-10-CM

## 2021-10-26 DIAGNOSIS — Z7901 Long term (current) use of anticoagulants: Secondary | ICD-10-CM | POA: Diagnosis not present

## 2021-10-26 DIAGNOSIS — I4891 Unspecified atrial fibrillation: Secondary | ICD-10-CM

## 2021-10-26 DIAGNOSIS — I4819 Other persistent atrial fibrillation: Secondary | ICD-10-CM | POA: Diagnosis not present

## 2021-10-26 MED ORDER — DILTIAZEM HCL ER 120 MG PO CP12: 120.0000 mg | ORAL_CAPSULE | Freq: Two times a day (BID) | ORAL | 3 refills | Status: DC

## 2021-10-26 NOTE — Patient Instructions (Signed)
Refill for your Diltiazem 120 mg Twice daily has been sent into [facility] for you  Your provider has recommended that  you wear a Zio Patch for 7 days.  This monitor will record your heart rhythm for our review.  IF you have any symptoms while wearing the monitor please press the button.  If you have any issues with the patch or you notice a red or orange light on it please call the company at [phone].  Once you remove the patch please mail it back to the company as soon as possible so we can get the results.  We will you with the results of the monitor

## 2021-10-26 NOTE — Progress Notes (Signed)
Primary Care Physician: Merri Brunette, MD Referring Physician:Dr. Lalla Brothers Cardiologist: Dr. Shan Levans is a 87 y.o. female with a h/o recently persistent afib, HTN, chronic diastolic HF, mechanical aortic valve replacement, on wafarin, that is in the afib cliic to discuss optios to restore SR. He took amiodarone ordered by Dr. Wilmon Arms, but then reade the side effects and stopped drug after 10 days. She then saw Dr. Lalla Brothers to discuss options and he wanted her to come in for tikosyn. But then she checked with Medicare and found out it would cost her $300 dollars a day  while in the hospital so she did not want to do that. She also has concerns that her husband would have to stay alone and would require someone to check in on him. She reports that since  the dose of diltiazem was doubled, she has felt much better.  She has recently finished antibiotics for pneumonia.   Today, she denies symptoms of palpitations, chest pain, shortness of breath, orthopnea, PND, lower extremity edema, dizziness, presyncope, syncope, or neurologic sequela. The patient is tolerating medications without difficulties and is otherwise without complaint today.   Past Medical History:  Diagnosis Date   Acquired hyperlipoproteinemia    Chronic diastolic heart failure (HCC) 09/01/2013   Diastolic dysfunction    Dyspnea    HTN (hypertension)    Hyperlipidemia    Osteopenia    Past Surgical History:  Procedure Laterality Date   APPENDECTOMY     CARDIAC VALVE REPLACEMENT     CARDIOVERSION N/A 08/24/2021   Procedure: CARDIOVERSION;  Surgeon: Quintella Reichert, MD;  Location: [facility] ENDOSCOPY;  Service: Cardiovascular;  Laterality: N/A;   CATARACT EXTRACTION     ESOPHAGOGASTRODUODENOSCOPY N/A 07/14/2016   Procedure: ESOPHAGOGASTRODUODENOSCOPY (EGD);  Surgeon: Dorena Cookey, MD;  Location: [facility] ENDOSCOPY;  Service: Endoscopy;  Laterality: N/A;   ROTATOR CUFF REPAIR     TEE WITHOUT CARDIOVERSION N/A 08/24/2021    Procedure: TRANSESOPHAGEAL ECHOCARDIOGRAM (TEE);  Surgeon: Quintella Reichert, MD;  Location: [facility] ENDOSCOPY;  Service: Cardiovascular;  Laterality: N/A;   TONSILLECTOMY      Current Outpatient Medications  Medication Sig Dispense Refill   Acetylcarnitine HCl (ACETYL L-CARNITINE) 500 MG CAPS Take 500 mg by mouth daily.     Acetylcysteine (NAC 600) 600 MG CAPS Take 600 mg by mouth daily.     alum & mag hydroxide-simeth (MYLANTA MAXIMUM STRENGTH) 400-400-40 MG/5ML suspension Take 15 mLs by mouth every 6 (six) hours as needed for indigestion.     amLODipine (NORVASC) 2.5 MG tablet Take 2.5 mg by mouth daily.     Ascorbic Acid (VITAMIN C) 1000 MG tablet Take 1,000 mg by mouth daily.     atorvastatin (LIPITOR) 10 MG tablet Take 10 mg by mouth daily at 6 PM.      b complex vitamins capsule Take 1 capsule by mouth daily.     Biotin 5000 MCG TABS Take 5,000 mcg by mouth once a week.     calcium citrate-vitamin D (CITRACAL+D) 315-200 MG-UNIT tablet Take 1 tablet by mouth at bedtime.     Cholecalciferol (VITAMIN D) 50 MCG (2000 UT) CAPS Take 2,000 Units by mouth daily.     diltiazem (CARDIZEM CD) 120 MG 24 hr capsule Take 1 capsule (120 mg total) by mouth in the morning and at bedtime. 180 capsule 3   docusate sodium (COLACE) 250 MG capsule Take 250 mg by mouth daily as needed for constipation.     ferrous  sulfate 325 (65 FE) MG tablet Take 325 mg by mouth once a week.     fluticasone (FLONASE) 50 MCG/ACT nasal spray Place 1 spray into both nostrils daily for 3 days. 16 g 0   furosemide (LASIX) 20 MG tablet Take 1 tablet (20 mg total) by mouth daily as needed for edema (Takes when not leaving the house).     levothyroxine (SYNTHROID) 25 MCG tablet Take 25 mcg by mouth daily before breakfast.     Magnesium 400 MG CAPS Take 400 mg by mouth 2 (two) times daily. Triple complex     omega-3 acid ethyl esters (LOVAZA) 1 g capsule Take 1 g by mouth daily.     OVER THE COUNTER MEDICATION Take 1 capsule by mouth  daily. uric acid cleanse     OVER THE COUNTER MEDICATION Take 1 capsule by mouth 2 (two) times daily. leg vein essentials     OVER THE COUNTER MEDICATION Place 1 drop into both eyes daily as needed (dry eye).     OVER THE COUNTER MEDICATION Take 1 capsule by mouth 3 (three) times daily with meals. LipoSan Ultra Chitosan     Passion Flower-Valerian 500-500 MG CAPS See admin instructions.     Polyethyl Glycol-Propyl Glycol (SYSTANE) 0.4-0.3 % SOLN Place 1 drop into both eyes daily as needed (Dry eye).     polyethylene glycol (MIRALAX / GLYCOLAX) 17 g packet Take 17 g by mouth daily.     TART CHERRY PO Take 3,000 mg by mouth daily.     valsartan (DIOVAN) 160 MG tablet Take 1 tablet (160 mg total) by mouth daily. 30 tablet 1   Varenicline Tartrate (TYRVAYA) 0.03 MG/ACT SOLN Place 1 spray into both nostrils daily as needed (Allergies).     warfarin (JANTOVEN) 5 MG tablet Take 0.5-1 tablets (2.5-5 mg total) by mouth See admin instructions. Take 2.5 mg on Tuesday and Thursday. Take 5 mg all the other days     zinc gluconate 50 MG tablet Take 50 mg by mouth daily.     No current facility-administered medications for this encounter.    Allergies  Allergen Reactions   Codeine     Halluciations   Sudafed [Pseudoephedrine Hcl]     Halluications    Social History   Socioeconomic History   Marital status: Married    Spouse name: Not on file   Number of children: Not on file   Years of education: Not on file   Highest education level: Not on file  Occupational History   Not on file  Tobacco Use   Smoking status: Never   Smokeless tobacco: Never  Substance and Sexual Activity   Alcohol use: No   Drug use: No   Sexual activity: Not on file  Other Topics Concern   Not on file  Social History Narrative   Not on file   Social Determinants of Health   Financial Resource Strain: Not on file  Food Insecurity: Not on file  Transportation Needs: Not on file  Physical Activity: Not on file   Stress: Not on file  Social Connections: Not on file  Intimate Partner Violence: Not on file    Family History  Problem Relation Age of Onset   Heart failure Sister     ROS- All systems are reviewed and negative except as per the HPI above  Physical Exam: Vitals:   10/26/21 1347  BP: 118/60  Pulse: 90  SpO2: 95%  Weight: 68.9 kg  Height: 5'  1" (1.549 m)   Wt Readings from Last 3 Encounters:  10/26/21 68.9 kg  09/27/21 69.9 kg  09/01/21 68 kg    Labs: Lab Results  Component Value Date   NA 141 09/01/2021   K 4.4 09/01/2021   CL 101 09/01/2021   CO2 27 09/01/2021   GLUCOSE 92 09/01/2021   BUN 21 09/01/2021   CREATININE 0.99 09/01/2021   CALCIUM 9.4 09/01/2021   MG 2.2 04/05/2020   Lab Results  Component Value Date   INR 2.3 10/12/2021   Lab Results  Component Value Date   TRIG 105 04/01/2020     GEN- The patient is well appearing, alert and oriented x 3 today.   Head- normocephalic, atraumatic Eyes-  Sclera clear, conjunctiva pink Ears- hearing intact Oropharynx- clear Neck- supple, no JVP Lymph- no cervical lymphadenopathy Lungs- Clear to ausculation bilaterally, normal work of breathing Heart- irregular rate and rhythm, no murmurs, rubs or gallops, PMI not laterally displaced GI- soft, NT, ND, + BS Extremities- no clubbing, cyanosis, or edema MS- no significant deformity or atrophy Skin- no rash or lesion Psych- euthymic mood, full affect Neuro- strength and sensation are intact  EKG-afib at 90 bpm, qrs int 82 ms, qtc 435 ms   Epic records reviewed   Assessment and Plan:  1. Persistent afib Pt stopped amiodarone for potential side effects and would not prefer to come in for Tikosyn at this time Multaq contraindicated in HF nor is flecainide an optimal choice  She is also considering if rate control will be choice of therapy  She is willing to reconsider Tikosyn, but now until early spring. I told her to consider the money that she would pay  out of pocket as an investment in her health and well being to be able to stay in SR. She has felt much better since the dilt dose was increased to 180 mg bid and we discussed a one week ZIO patch in the interim to make sure she is well rate controlled to minimize issues with decompensated HF  Continue diltiazem 180 mg bid. Rx sent in.Rate controlled  in the office today  2. CHA2DS2VASc  score of at least 5.  She is  on warfarin  If she is admitted for Tikosyn, she would require 4 weekly INR therapeutic readings   3, HTN Stable   4. Chronic diastolic HF Normovolemic today   I will call back after monitor results are in and she will try to have a answer for Tikosyn at that point   Lupita Leash C. Matthew Folks [facility] [address] [place], [place] [address] [phone]

## 2021-11-10 DIAGNOSIS — I4891 Unspecified atrial fibrillation: Secondary | ICD-10-CM | POA: Diagnosis not present

## 2021-11-14 ENCOUNTER — Ambulatory Visit: Payer: Medicare HMO

## 2021-11-14 ENCOUNTER — Other Ambulatory Visit: Payer: Self-pay

## 2021-11-14 DIAGNOSIS — Z5181 Encounter for therapeutic drug level monitoring: Secondary | ICD-10-CM

## 2021-11-14 DIAGNOSIS — Z952 Presence of prosthetic heart valve: Secondary | ICD-10-CM

## 2021-11-14 LAB — POCT INR: INR: 1.8 — AB (ref 2.0–3.0)

## 2021-11-14 NOTE — Patient Instructions (Signed)
TAKE 1.5 TABLETS TONIGHT ONLY and then continue taking 1/2 tablet daily except 1 tablet on Monday, Wednesday, and Friday. Recheck INR in 4 weeks. [facility] [phone]

## 2021-11-22 ENCOUNTER — Telehealth (HOSPITAL_COMMUNITY): Payer: Self-pay | Admitting: *Deleted

## 2021-11-22 NOTE — Telephone Encounter (Signed)
-----   Message from Newman Nip, NP sent at 11/22/2021  8:57 AM EST ----- Please let pt know that she is in afib 100% of the time, reasonable rate controlled. This is unchanged. Her options are rate control, amio or Tikosyn.

## 2021-11-22 NOTE — Telephone Encounter (Signed)
Pt has decided to proceed with tikosyn.

## 2021-11-23 ENCOUNTER — Telehealth: Payer: Self-pay | Admitting: Pharmacist

## 2021-11-23 DIAGNOSIS — E039 Hypothyroidism, unspecified: Secondary | ICD-10-CM | POA: Diagnosis not present

## 2021-11-23 NOTE — Telephone Encounter (Signed)
Medication list reviewed in anticipation of upcoming Tikosyn initiation.   Patient is taking many OTC medications: Recommend she stop her passion flower-valerian pill (valerian interacts with Tikosyn metabolism). Recommend stopping her uric acid cleanse and leg vein supplements - there's at least 7 different herbal products in each and minimal data to rule out drug interactions with Tikosyn (the uric acid supplement contains a few meds that also can thin her blood which aren't ideal with her already on warfarin, along with milk thistle that interacts with her warfarin too) Her tart cherry supplement also interacts with her warfarin and would recommend stopping. Would clarify if pt is taking both amlodipine and diltiazem, should not be on 2 calcium channel blockers.  Patient is anticoagulated on warfarin. She will require 4 weekly INR checks before starting Tikosyn. Scheduled for INR check on 1/30 and will require weekly checks thereafter.  Patient will need to be counseled to avoid use of Benadryl while on Tikosyn and in the 2-3 days prior to Tikosyn initiation.

## 2021-11-25 ENCOUNTER — Encounter (HOSPITAL_COMMUNITY): Payer: Self-pay

## 2021-12-02 NOTE — Telephone Encounter (Signed)
Patient notified she will need to stop the recommended supplements for tikosyn start. Pt verbalized understanding she will go ahead and stop them.

## 2021-12-05 ENCOUNTER — Ambulatory Visit: Payer: Medicare HMO

## 2021-12-05 ENCOUNTER — Other Ambulatory Visit: Payer: Self-pay

## 2021-12-05 DIAGNOSIS — Z5181 Encounter for therapeutic drug level monitoring: Secondary | ICD-10-CM

## 2021-12-05 DIAGNOSIS — Z952 Presence of prosthetic heart valve: Secondary | ICD-10-CM | POA: Diagnosis not present

## 2021-12-05 LAB — POCT INR: INR: 2.6 (ref 2.0–3.0)

## 2021-12-05 NOTE — Patient Instructions (Signed)
continue taking 1/2 tablet daily except 1 tablet on Monday, Wednesday, and Friday. Recheck INR in 1 week.  Pre Tikosyn start 2/20;  weekly INRs. [facility] [phone] ° °

## 2021-12-12 ENCOUNTER — Other Ambulatory Visit: Payer: Self-pay

## 2021-12-12 ENCOUNTER — Ambulatory Visit: Payer: Medicare HMO

## 2021-12-12 DIAGNOSIS — Z5181 Encounter for therapeutic drug level monitoring: Secondary | ICD-10-CM | POA: Diagnosis not present

## 2021-12-12 DIAGNOSIS — Z952 Presence of prosthetic heart valve: Secondary | ICD-10-CM | POA: Diagnosis not present

## 2021-12-12 LAB — POCT INR: INR: 2.1 (ref 2.0–3.0)

## 2021-12-12 NOTE — Patient Instructions (Signed)
TAKE 1.5 TABLETS TONIGHT and then continue taking 1/2 tablet daily except 1 tablet on Monday, Wednesday, and Friday. Recheck INR in 1 week.  Pre Tikosyn start 2/20;  weekly INRs. [facility] [phone]

## 2021-12-19 ENCOUNTER — Other Ambulatory Visit: Payer: Self-pay

## 2021-12-19 ENCOUNTER — Ambulatory Visit: Payer: Medicare HMO

## 2021-12-19 DIAGNOSIS — Z5181 Encounter for therapeutic drug level monitoring: Secondary | ICD-10-CM

## 2021-12-19 DIAGNOSIS — Z952 Presence of prosthetic heart valve: Secondary | ICD-10-CM

## 2021-12-19 LAB — POCT INR: INR: 2.6 (ref 2.0–3.0)

## 2021-12-19 NOTE — Patient Instructions (Signed)
continue taking 1/2 tablet daily except 1 tablet on Monday, Wednesday, and Friday. Recheck INR in 1 week.  Pre Tikosyn start 2/20;  weekly INRs. [facility] Clinic [phone]

## 2021-12-23 ENCOUNTER — Other Ambulatory Visit: Payer: Self-pay | Admitting: Cardiology

## 2021-12-23 LAB — SARS CORONAVIRUS 2 (TAT 6-24 HRS): SARS Coronavirus 2: NEGATIVE

## 2021-12-26 ENCOUNTER — Ambulatory Visit (HOSPITAL_COMMUNITY)
Admission: RE | Admit: 2021-12-26 | Discharge: 2021-12-26 | Disposition: A | Discharge status: Home / Self Care | Payer: Medicare HMO | Source: Ambulatory Visit | Attending: Physician Assistant | Admitting: Physician Assistant

## 2021-12-26 ENCOUNTER — Other Ambulatory Visit: Payer: Self-pay

## 2021-12-26 ENCOUNTER — Inpatient Hospital Stay (HOSPITAL_COMMUNITY)
Admission: AD | Admit: 2021-12-26 | Discharge: 2021-12-29 | DRG: 309 | Disposition: A | Discharge status: Home / Self Care | Payer: Medicare HMO | Source: Ambulatory Visit | Attending: Internal Medicine | Admitting: Internal Medicine

## 2021-12-26 ENCOUNTER — Ambulatory Visit: Payer: Medicare HMO

## 2021-12-26 ENCOUNTER — Ambulatory Visit (HOSPITAL_COMMUNITY): Payer: Medicare HMO | Admitting: Physician Assistant

## 2021-12-26 ENCOUNTER — Other Ambulatory Visit (HOSPITAL_COMMUNITY): Payer: Self-pay

## 2021-12-26 ENCOUNTER — Encounter (HOSPITAL_COMMUNITY): Payer: Self-pay | Admitting: Nurse Practitioner

## 2021-12-26 DIAGNOSIS — Z79890 Hormone replacement therapy (postmenopausal): Secondary | ICD-10-CM | POA: Diagnosis not present

## 2021-12-26 DIAGNOSIS — Z79899 Other long term (current) drug therapy: Secondary | ICD-10-CM

## 2021-12-26 DIAGNOSIS — I4819 Other persistent atrial fibrillation: Secondary | ICD-10-CM | POA: Diagnosis not present

## 2021-12-26 DIAGNOSIS — I110 Hypertensive heart disease with heart failure: Secondary | ICD-10-CM | POA: Diagnosis not present

## 2021-12-26 DIAGNOSIS — Z5181 Encounter for therapeutic drug level monitoring: Secondary | ICD-10-CM | POA: Diagnosis not present

## 2021-12-26 DIAGNOSIS — Z952 Presence of prosthetic heart valve: Secondary | ICD-10-CM | POA: Diagnosis not present

## 2021-12-26 DIAGNOSIS — M8580 Other specified disorders of bone density and structure, unspecified site: Secondary | ICD-10-CM | POA: Diagnosis present

## 2021-12-26 DIAGNOSIS — Z888 Allergy status to other drugs, medicaments and biological substances status: Secondary | ICD-10-CM | POA: Diagnosis not present

## 2021-12-26 DIAGNOSIS — I4891 Unspecified atrial fibrillation: Secondary | ICD-10-CM | POA: Diagnosis present

## 2021-12-26 DIAGNOSIS — E785 Hyperlipidemia, unspecified: Secondary | ICD-10-CM | POA: Diagnosis not present

## 2021-12-26 DIAGNOSIS — Z885 Allergy status to narcotic agent status: Secondary | ICD-10-CM

## 2021-12-26 DIAGNOSIS — Z7901 Long term (current) use of anticoagulants: Secondary | ICD-10-CM | POA: Diagnosis not present

## 2021-12-26 DIAGNOSIS — I5032 Chronic diastolic (congestive) heart failure: Secondary | ICD-10-CM | POA: Diagnosis not present

## 2021-12-26 DIAGNOSIS — Z20822 Contact with and (suspected) exposure to covid-19: Secondary | ICD-10-CM | POA: Diagnosis not present

## 2021-12-26 DIAGNOSIS — Z8249 Family history of ischemic heart disease and other diseases of the circulatory system: Secondary | ICD-10-CM

## 2021-12-26 DIAGNOSIS — D6869 Other thrombophilia: Secondary | ICD-10-CM

## 2021-12-26 LAB — BASIC METABOLIC PANEL
Anion gap: 8 (ref 5–15)
BUN: 16 mg/dL (ref 8–23)
CO2: 26 mmol/L (ref 22–32)
Calcium: 9.4 mg/dL (ref 8.9–10.3)
Chloride: 104 mmol/L (ref 98–111)
Creatinine, Ser: 0.81 mg/dL (ref 0.44–1.00)
GFR, Estimated: >60 mL/min (ref >60)
Glucose, Bld: 107 mg/dL — ABNORMAL HIGH (ref 70–99)
Potassium: 4.2 mmol/L (ref 3.5–5.1)
Sodium: 138 mmol/L (ref 135–145)

## 2021-12-26 LAB — POCT INR: INR: 2.2 (ref 2.0–3.0)

## 2021-12-26 LAB — MAGNESIUM: Magnesium: 2.4 mg/dL (ref 1.7–2.4)

## 2021-12-26 MED ORDER — IRBESARTAN 150 MG PO TABS
150.0000 mg | ORAL_TABLET | Freq: Every day | ORAL | Status: DC
  Administered 2021-12-27 – 2021-12-29 (×3): 150 mg via ORAL
  Filled 2021-12-26 (×3): qty 1

## 2021-12-26 MED ORDER — AMLODIPINE BESYLATE 2.5 MG PO TABS
2.5000 mg | ORAL_TABLET | Freq: Every day | ORAL | Status: DC
  Administered 2021-12-27 – 2021-12-28 (×2): 2.5 mg via ORAL
  Filled 2021-12-26 (×3): qty 1

## 2021-12-26 MED ORDER — FUROSEMIDE 20 MG PO TABS: 20.0000 mg | ORAL_TABLET | Freq: Every day | ORAL | Status: DC | PRN

## 2021-12-26 MED ORDER — DILTIAZEM HCL ER 60 MG PO CP12
120.0000 mg | ORAL_CAPSULE | Freq: Two times a day (BID) | ORAL | Status: DC
  Administered 2021-12-26 – 2021-12-29 (×6): 120 mg via ORAL
  Filled 2021-12-26 (×7): qty 2

## 2021-12-26 MED ORDER — ATORVASTATIN CALCIUM 10 MG PO TABS
10.0000 mg | ORAL_TABLET | Freq: Every day | ORAL | Status: DC
Start: 2021-12-26 — End: 2021-12-29
  Administered 2021-12-26 – 2021-12-28 (×3): 10 mg via ORAL
  Filled 2021-12-26 (×3): qty 1

## 2021-12-26 MED ORDER — SODIUM CHLORIDE 0.9% FLUSH: 3.0000 mL | INTRAVENOUS | Status: DC | PRN

## 2021-12-26 MED ORDER — LEVOTHYROXINE SODIUM 25 MCG PO TABS
25.0000 ug | ORAL_TABLET | Freq: Every day | ORAL | Status: DC
  Administered 2021-12-27 – 2021-12-29 (×3): 25 ug via ORAL
  Filled 2021-12-26 (×3): qty 1

## 2021-12-26 MED ORDER — OMEGA-3-ACID ETHYL ESTERS 1 G PO CAPS
1.0000 g | ORAL_CAPSULE | Freq: Every day | ORAL | Status: DC
  Administered 2021-12-27 – 2021-12-29 (×3): 1 g via ORAL
  Filled 2021-12-26 (×3): qty 1

## 2021-12-26 MED ORDER — MAGNESIUM OXIDE -MG SUPPLEMENT 400 (240 MG) MG PO TABS
400.0000 mg | ORAL_TABLET | Freq: Every day | ORAL | Status: DC
  Administered 2021-12-27 – 2021-12-29 (×3): 400 mg via ORAL
  Filled 2021-12-26 (×3): qty 1

## 2021-12-26 MED ORDER — WARFARIN - PHARMACIST DOSING INPATIENT: Freq: Every day | Status: DC

## 2021-12-26 MED ORDER — WARFARIN SODIUM 2.5 MG PO TABS: 2.5000 mg | ORAL_TABLET | ORAL | Status: DC

## 2021-12-26 MED ORDER — DOFETILIDE 250 MCG PO CAPS
250.0000 ug | ORAL_CAPSULE | Freq: Two times a day (BID) | ORAL | Status: DC
  Administered 2021-12-26 – 2021-12-29 (×6): 250 ug via ORAL
  Filled 2021-12-26 (×6): qty 1

## 2021-12-26 MED ORDER — SODIUM CHLORIDE 0.9% FLUSH
3.0000 mL | Freq: Two times a day (BID) | INTRAVENOUS | Status: DC
  Administered 2021-12-26 – 2021-12-28 (×3): 3 mL via INTRAVENOUS

## 2021-12-26 MED ORDER — ALUM & MAG HYDROXIDE-SIMETH 200-200-20 MG/5ML PO SUSP: 15.0000 mL | Freq: Four times a day (QID) | ORAL | Status: DC | PRN

## 2021-12-26 MED ORDER — SODIUM CHLORIDE 0.9 % IV SOLN: 250.0000 mL | INTRAVENOUS | Status: DC | PRN

## 2021-12-26 MED ORDER — MAGNESIUM 400 MG PO CAPS: 400.0000 mg | ORAL_CAPSULE | Freq: Every day | ORAL | Status: DC

## 2021-12-26 NOTE — Plan of Care (Signed)
°  Problem: Education: Goal: Knowledge of disease or condition will improve Outcome: Progressing   Problem: Cardiac: Goal: Ability to achieve and maintain adequate cardiopulmonary perfusion will improve Outcome: Progressing   Problem: Health Behavior/Discharge Planning: Goal: Ability to safely manage health-related needs after discharge will improve Outcome: Progressing   Problem: Clinical Measurements: Goal: Cardiovascular complication will be avoided Outcome: Progressing   Problem: Safety: Goal: Ability to remain free from injury will improve Outcome: Progressing

## 2021-12-26 NOTE — Patient Instructions (Signed)
continue taking 1/2 tablet daily except 1 tablet on Monday, Wednesday, and Friday.  INR in 6 weeks.  Pre Tikosyn start 2/20;  weekly INRs. Coumadin Clinic [phone]

## 2021-12-26 NOTE — H&P (Addendum)
Electrophysiology H&P  Note   Primary Care Physician: Merri Brunette, MD Referring Physician:Dr. Lalla Brothers Cardiologist: Dr. Shan Levans is a 88 y.o. female with a h/o recently persistent afib, HTN, chronic diastolic HF, mechanical aortic valve replacement, on wafarin, that is in the afib cliic to discuss optios to restore SR. She took amiodarone ordered by Dr. Anne Fu , but then read the side effects and stopped drug after 10 days. She then saw Dr. Lalla Brothers to discuss options and he wanted her to come in for tikosyn. But then she checked with Medicare and found out it would cost her $300 dollars a day  while in the hospital so she did not want to do that. She also has concerns that her husband would have to stay alone and would require someone to check in on him. She reports that since  the dose of diltiazem was doubled, she has felt much better.  She has recently finished antibiotics for pneumonia.   Pt is now back in the afib clinic after she rethought her options and wanted to go ahead with Tikosyn admission. She has had 4 therapeutic warfain levels this am at 2.2. Covid negative.  She has stopped passion flower -valerian pill and ureic acid cleanse and leg vein supplements, and  tart cherry supplement. She remains in afib at 129 bpm. Qtc 489 ms. Last qt in SR was 469 ms. Her husband is currently short term  in a SNF so she does not have to worry about her home care  at the current time. Her crcl cal is 51.76 indicating that 250 mcg bid will be her appropriate dose of tikosyn.   Today, she denies symptoms of palpitations, chest pain, shortness of breath, orthopnea, PND, lower extremity edema, dizziness, presyncope, syncope, or neurologic sequela. The patient is tolerating medications without difficulties and is otherwise without complaint today.   Past Medical History:  Diagnosis Date   Acquired hyperlipoproteinemia    Chronic diastolic heart failure (HCC) 09/01/2013   Diastolic  dysfunction    Dyspnea    HTN (hypertension)    Hyperlipidemia    Osteopenia    Past Surgical History:  Procedure Laterality Date   APPENDECTOMY     CARDIAC VALVE REPLACEMENT     CARDIOVERSION N/A 08/24/2021   Procedure: CARDIOVERSION;  Surgeon: Quintella Reichert, MD;  Location: [facility] ENDOSCOPY;  Service: Cardiovascular;  Laterality: N/A;   CATARACT EXTRACTION     ESOPHAGOGASTRODUODENOSCOPY N/A 07/14/2016   Procedure: ESOPHAGOGASTRODUODENOSCOPY (EGD);  Surgeon: Dorena Cookey, MD;  Location: [facility] ENDOSCOPY;  Service: Endoscopy;  Laterality: N/A;   ROTATOR CUFF REPAIR     TEE WITHOUT CARDIOVERSION N/A 08/24/2021   Procedure: TRANSESOPHAGEAL ECHOCARDIOGRAM (TEE);  Surgeon: Quintella Reichert, MD;  Location: [facility] ENDOSCOPY;  Service: Cardiovascular;  Laterality: N/A;   TONSILLECTOMY      No current facility-administered medications for this encounter.    Allergies  Allergen Reactions   Codeine     Halluciations   Sudafed [Pseudoephedrine Hcl]     Halluications    Social History   Socioeconomic History   Marital status: Married    Spouse name: Not on file   Number of children: Not on file   Years of education: Not on file   Highest education level: Not on file  Occupational History   Not on file  Tobacco Use   Smoking status: Never   Smokeless tobacco: Never  Substance and Sexual Activity   Alcohol use: No   Drug  use: No   Sexual activity: Not on file  Other Topics Concern   Not on file  Social History Narrative   Not on file   Social Determinants of Health   Financial Resource Strain: Not on file  Food Insecurity: Not on file  Transportation Needs: Not on file  Physical Activity: Not on file  Stress: Not on file  Social Connections: Not on file  Intimate Partner Violence: Not on file    Family History  Problem Relation Age of Onset   Heart failure Sister     ROS- All systems are reviewed and negative except as per the HPI above  Physical Exam: Vitals:   12/26/21  1422  BP: 111/73  Pulse: (!) 102  Resp: 18  Temp: 97.8 F (36.6 C)  TempSrc: Oral  SpO2: 98%  Weight: 67 kg  Height: 5\' 1"  (1.549 m)   Wt Readings from Last 3 Encounters:  12/26/21 67 kg  12/26/21 (P) 66.5 kg  10/26/21 68.9 kg    Labs: Lab Results  Component Value Date   NA 138 12/26/2021   K 4.2 12/26/2021   CL 104 12/26/2021   CO2 26 12/26/2021   GLUCOSE 107 (H) 12/26/2021   BUN 16 12/26/2021   CREATININE 0.81 12/26/2021   CALCIUM 9.4 12/26/2021   MG 2.4 12/26/2021   Lab Results  Component Value Date   INR 2.2 12/26/2021   Lab Results  Component Value Date   TRIG 105 04/01/2020     GEN- The patient is well appearing, alert and oriented x 3 today.   Head- normocephalic, atraumatic Eyes-  Sclera clear, conjunctiva pink Ears- hearing intact Oropharynx- clear Neck- supple, no JVP Lymph- no cervical lymphadenopathy Lungs- Clear to ausculation bilaterally, normal work of breathing Heart- irregular rate and rhythm, no murmurs, rubs or gallops, PMI not laterally displaced GI- soft, NT, ND, + BS Extremities- no clubbing, cyanosis, or edema MS- no significant deformity or atrophy Skin- no rash or lesion Psych- euthymic mood, full affect Neuro- strength and sensation are intact  EKG-afib at 129 bpm, qrs int 80 ms, qtc 489 ms   Epic records reviewed   Assessment and Plan:  1. Persistent afib Pt stopped amiodarone for potential side effects and discussed with Dr. Quentin Ore who suggested tikosyn, at time of initial visit with me, she did not prefer to do this then She reconsidered and is now here for Tikosyn admit  She has stopped the necessary supplements as suggested by PharmD Covid negative   2. CHA2DS2VASc  score of at least 5.  She is  on warfarin  Last 4 weekly INR therapeutic readings in range   3, HTN Stable   4. Chronic diastolic HF Normovolemic today   Pt presents to [facility] for [facility] admission as above without change or new complaint.  Legrand Como  [address]" [place], PA-C  12/26/2021 2:27 PM   EP Attending  Patient seen and examined. Agree with the findings as noted above. The patient presents for initiation of dofetilide. She is in atrial fib with a RVR and her QT is acceptable. Dr. Quentin Ore will followup.   Carleene Overlie Levy Cedano,MD

## 2021-12-26 NOTE — Progress Notes (Signed)
Primary Care Physician: Merri Brunette, MD Referring Physician:Dr. Lalla Brothers Cardiologist: Dr. Shan Levans is a 88 y.o. female with a h/o recently persistent afib, HTN, chronic diastolic HF, mechanical aortic valve replacement, on wafarin, that is in the afib cliic to discuss optios to restore SR. She took amiodarone ordered by Dr. Anne Fu , but then read the side effects and stopped drug after 10 days. She then saw Dr. Lalla Brothers to discuss options and he wanted her to come in for tikosyn. But then she checked with Medicare and found out it would cost her $300 dollars a day  while in the hospital so she did not want to do that. She also has concerns that her husband would have to stay alone and would require someone to check in on him. She reports that since  the dose of diltiazem was doubled, she has felt much better.  She has recently finished antibiotics for pneumonia.   Pt is now back in the afib clinic after she rethought her options and wanted to go ahead with Tikosyn admission. She has had 4 therapeutic warfain levels this am at 2.2. Covid negative.  She has stopped passion flower -valerian pill and ureic acid cleanse and leg vein supplements, and  tart cherry supplement. She remains in afib at 129 bpm. Qtc 489 ms. Last qt in SR was 469 ms. Her husband is currently short term  in a SNF so she does not have to worry about her home care  at the current time. Her crcl cal is 51.76 indicating that 250 mcg bid will be her appropriate dose of tikosyn.   Today, she denies symptoms of palpitations, chest pain, shortness of breath, orthopnea, PND, lower extremity edema, dizziness, presyncope, syncope, or neurologic sequela. The patient is tolerating medications without difficulties and is otherwise without complaint today.   Past Medical History:  Diagnosis Date   Acquired hyperlipoproteinemia    Chronic diastolic heart failure (HCC) 09/01/2013   Diastolic dysfunction    Dyspnea    HTN  (hypertension)    Hyperlipidemia    Osteopenia    Past Surgical History:  Procedure Laterality Date   APPENDECTOMY     CARDIAC VALVE REPLACEMENT     CARDIOVERSION N/A 08/24/2021   Procedure: CARDIOVERSION;  Surgeon: Quintella Reichert, MD;  Location: [facility] ENDOSCOPY;  Service: Cardiovascular;  Laterality: N/A;   CATARACT EXTRACTION     ESOPHAGOGASTRODUODENOSCOPY N/A 07/14/2016   Procedure: ESOPHAGOGASTRODUODENOSCOPY (EGD);  Surgeon: Dorena Cookey, MD;  Location: [facility] ENDOSCOPY;  Service: Endoscopy;  Laterality: N/A;   ROTATOR CUFF REPAIR     TEE WITHOUT CARDIOVERSION N/A 08/24/2021   Procedure: TRANSESOPHAGEAL ECHOCARDIOGRAM (TEE);  Surgeon: Quintella Reichert, MD;  Location: [facility] ENDOSCOPY;  Service: Cardiovascular;  Laterality: N/A;   TONSILLECTOMY      Current Outpatient Medications  Medication Sig Dispense Refill   alum & mag hydroxide-simeth (MYLANTA MAXIMUM STRENGTH) 400-400-40 MG/5ML suspension Take 15 mLs by mouth every 6 (six) hours as needed for indigestion.     amLODipine (NORVASC) 2.5 MG tablet Take 2.5 mg by mouth daily.     Ascorbic Acid (VITAMIN C) 1000 MG tablet Take 1,000 mg by mouth daily.     atorvastatin (LIPITOR) 10 MG tablet Take 10 mg by mouth daily at 6 PM.      b complex vitamins capsule Take 1 capsule by mouth daily.     Biotin 5000 MCG TABS Take 5,000 mcg by mouth once a week.  calcium citrate-vitamin D (CITRACAL+D) 315-200 MG-UNIT tablet Take 1 tablet by mouth at bedtime.     Cholecalciferol (VITAMIN D) 50 MCG (2000 UT) CAPS Take 2,000 Units by mouth daily.     diltiazem (CARDIZEM SR) 120 MG 12 hr capsule Take 1 capsule (120 mg total) by mouth 2 (two) times daily. 180 capsule 3   docusate sodium (COLACE) 250 MG capsule Take 250 mg by mouth daily as needed for constipation.     ferrous sulfate 325 (65 FE) MG tablet Take 325 mg by mouth once a week.     fluticasone (FLONASE) 50 MCG/ACT nasal spray Place 1 spray into both nostrils daily for 3 days. 16 g 0   furosemide  (LASIX) 20 MG tablet Take 1 tablet (20 mg total) by mouth daily as needed for edema (Takes when not leaving the house).     levothyroxine (SYNTHROID) 25 MCG tablet Take 25 mcg by mouth daily before breakfast.     Magnesium 400 MG CAPS Take 400 mg by mouth daily. Triple complex     omega-3 acid ethyl esters (LOVAZA) 1 g capsule Take 1 g by mouth daily.     OVER THE COUNTER MEDICATION Place 1 drop into both eyes daily as needed (dry eye).     OVER THE COUNTER MEDICATION Take 1 capsule by mouth 3 (three) times daily with meals. LipoSan Ultra Chitosan     Polyethyl Glycol-Propyl Glycol (SYSTANE) 0.4-0.3 % SOLN Place 1 drop into both eyes daily as needed (Dry eye).     polyethylene glycol (MIRALAX / GLYCOLAX) 17 g packet Take 17 g by mouth daily.     valsartan (DIOVAN) 160 MG tablet Take 1 tablet (160 mg total) by mouth daily. 30 tablet 1   warfarin (JANTOVEN) 5 MG tablet Take 0.5-1 tablets (2.5-5 mg total) by mouth See admin instructions. Take 2.5 mg on Tuesday and Thursday. Take 5 mg all the other days     zinc gluconate 50 MG tablet Take 50 mg by mouth daily.     Varenicline Tartrate (TYRVAYA) 0.03 MG/ACT SOLN Place 1 spray into both nostrils daily as needed (Allergies). (Patient not taking: Reported on 12/26/2021)     No current facility-administered medications for this encounter.    Allergies  Allergen Reactions   Codeine     Halluciations   Sudafed [Pseudoephedrine Hcl]     Halluications    Social History   Socioeconomic History   Marital status: Married    Spouse name: Not on file   Number of children: Not on file   Years of education: Not on file   Highest education level: Not on file  Occupational History   Not on file  Tobacco Use   Smoking status: Never   Smokeless tobacco: Never  Substance and Sexual Activity   Alcohol use: No   Drug use: No   Sexual activity: Not on file  Other Topics Concern   Not on file  Social History Narrative   Not on file   Social  Determinants of Health   Financial Resource Strain: Not on file  Food Insecurity: Not on file  Transportation Needs: Not on file  Physical Activity: Not on file  Stress: Not on file  Social Connections: Not on file  Intimate Partner Violence: Not on file    Family History  Problem Relation Age of Onset   Heart failure Sister     ROS- All systems are reviewed and negative except as per the HPI above  Physical Exam: Vitals:   12/26/21 1121  BP: (!) (P) 156/72  Pulse: (!) (P) 129  Weight: (P) 66.5 kg  Height: (P) 5\' 1"  (1.549 m)   Wt Readings from Last 3 Encounters:  12/26/21 (P) 66.5 kg  10/26/21 68.9 kg  09/27/21 69.9 kg    Labs: Lab Results  Component Value Date   NA 138 12/26/2021   K 4.2 12/26/2021   CL 104 12/26/2021   CO2 26 12/26/2021   GLUCOSE 107 (H) 12/26/2021   BUN 16 12/26/2021   CREATININE 0.81 12/26/2021   CALCIUM 9.4 12/26/2021   MG 2.4 12/26/2021   Lab Results  Component Value Date   INR 2.2 12/26/2021   Lab Results  Component Value Date   TRIG 105 04/01/2020     GEN- The patient is well appearing, alert and oriented x 3 today.   Head- normocephalic, atraumatic Eyes-  Sclera clear, conjunctiva pink Ears- hearing intact Oropharynx- clear Neck- supple, no JVP Lymph- no cervical lymphadenopathy Lungs- Clear to ausculation bilaterally, normal work of breathing Heart- irregular rate and rhythm, no murmurs, rubs or gallops, PMI not laterally displaced GI- soft, NT, ND, + BS Extremities- no clubbing, cyanosis, or edema MS- no significant deformity or atrophy Skin- no rash or lesion Psych- euthymic mood, full affect Neuro- strength and sensation are intact  EKG-afib at 129 bpm, qrs int 80 ms, qtc 489 ms   Epic records reviewed   Assessment and Plan:  1. Persistent afib Pt stopped amiodarone for potential side effects and discussed with Dr. 04/03/2020 who suggested tikosyn, at time of initial visit with me, she did not prefer to do this  then She reconsidered and is now here for Tikosyn admit  She has stopped the necessary supplements as suggested by PharmD Covid negative   2. CHA2DS2VASc  score of at least 5.  She is  on warfarin  Last 4 weekly INR therapeutic readings in range   3, HTN Stable   4. Chronic diastolic HF Normovolemic today   To 6E 25 when bed available   Lalla Brothers C. [facility] [address] [place], [place] [phone]

## 2021-12-26 NOTE — Progress Notes (Signed)
Pharmacy: Dofetilide (Tikosyn) - Initial Consult Assessment and Electrolyte Replacement  Pharmacy consulted to assist in monitoring and replacing electrolytes in this 88 y.o. female admitted on 12/26/2021 undergoing dofetilide initiation. First dofetilide dose: 2/20@2000 .  Assessment:  Patient Exclusion Criteria: If any screening criteria checked as "Yes", then  patient  should NOT receive dofetilide until criteria item is corrected.  If Yes please indicate correction plan.  YES  NO Patient  Exclusion Criteria Correction Plan   [x]   []   Baseline QTc interval is greater than or equal to 440 msec. IF above YES box checked dofetilide contraindicated unless patient has ICD; then may proceed if QTc 500-550 msec or with known ventricular conduction abnormalities may proceed with QTc 550-600 msec. QTc = 489 (Afib RVR) EP aware- okay to continue    []   [x]   Patient is known or suspected to have a digoxin level greater than 2 ng/ml: No results found for: DIGOXIN     []   [x]   Creatinine clearance less than 20 ml/min (calculated using Cockcroft-Gault, actual body weight and serum creatinine): Estimated Creatinine Clearance: 43.7 mL/min (by C-G formula based on SCr of 0.81 mg/dL).     []   [x]  Patient has received drugs known to prolong the QT intervals within the last 48 hours (phenothiazines, tricyclics or tetracyclic antidepressants, erythromycin, H-1 antihistamines, cisapride, fluoroquinolones, azithromycin, ondansetron).   Updated information on QT prolonging agents is available to be searched on the following database:QT prolonging agents     []   [x]   Patient received a dose of hydrochlorothiazide (Oretic) alone or in any combination including triamterene (Dyazide, Maxzide) in the last 48 hours.    []   [x]  Patient received a medication known to increase dofetilide plasma concentrations prior to initial dofetilide dose:  Trimethoprim (Primsol, Proloprim) in the last 36  hours Verapamil (Calan, Verelan) in the last 36 hours or a sustained release dose in the last 72 hours Megestrol (Megace) in the last 5 days  Cimetidine (Tagamet) in the last 6 hours Ketoconazole (Nizoral) in the last 24 hours Itraconazole (Sporanox) in the last 48 hours  Prochlorperazine (Compazine) in the last 36 hours     []   [x]   Patient is known to have a history of torsades de pointes; congenital or acquired long QT syndromes.    []   [x]   Patient has received a Class 1 antiarrhythmic with less than 2 half-lives since last dose. (Disopyramide, Quinidine, Procainamide, Lidocaine, Mexiletine, Flecainide, Propafenone)    []   [x]   Patient has received amiodarone therapy in the past 3 months or amiodarone level is greater than 0.3 ng/ml.    Patient has been appropriately anticoagulated with warfarin.  Labs:    Component Value Date/Time   K 4.2 12/26/2021 1130   MG 2.4 12/26/2021 1130     Plan: Potassium: K >/= 4: Appropriate to initiate Tikosyn, no replacement needed    Magnesium: Mg >2: Appropriate to initiate Tikosyn, no replacement needed     Thank you for allowing pharmacy to participate in this patient's care   Antonietta Jewel, PharmD, [place] Pharmacist  Phone: [phone] 12/26/2021 2:47 PM  Please check AMION for all [place] phone numbers After 10:00 PM, call [place] [phone]

## 2021-12-26 NOTE — Care Management (Signed)
12-26-21 1454 Patient presented for Tikosyn Load. Benefits check submitted for cost. Case Manager will follow for cost and pharmacy of choice.

## 2021-12-26 NOTE — Progress Notes (Signed)
ANTICOAGULATION CONSULT NOTE - Initial Consult  Pharmacy Consult for warfarin Indication: atrial fibrillation  Allergies  Allergen Reactions   Codeine     Halluciations   Sudafed [Pseudoephedrine Hcl]     Halluications    Patient Measurements: Height: 5\' 1"  (154.9 cm) Weight: 67 kg (147 lb 11.3 oz) IBW/kg (Calculated) : 47.8 Heparin Dosing Weight: 61.9 kg   Vital Signs: Temp: 97.8 F (36.6 C) (02/20 1422) Temp Source: Oral (02/20 1422) BP: 111/73 (02/20 1422) Pulse Rate: 102 (02/20 1422)  Labs: Recent Labs    12/26/21 1005 12/26/21 1130  INR 2.2  --   CREATININE  --  0.81    Estimated Creatinine Clearance: 43.7 mL/min (by C-G formula based on SCr of 0.81 mg/dL).   Medical History: Past Medical History:  Diagnosis Date   Acquired hyperlipoproteinemia    Chronic diastolic heart failure ([place]) XX123456   Diastolic dysfunction    Dyspnea    HTN (hypertension)    Hyperlipidemia    Osteopenia     Medications:  Scheduled:   [START ON 12/27/2021] amLODipine  2.5 mg Oral Daily   atorvastatin  10 mg Oral q1800   diltiazem  120 mg Oral BID   dofetilide  250 mcg Oral BID   [START ON 12/27/2021] irbesartan  150 mg Oral Daily   [START ON 12/27/2021] levothyroxine  25 mcg Oral QAC breakfast   [START ON 12/27/2021] Magnesium  400 mg Oral Daily   [START ON 12/27/2021] omega-3 acid ethyl esters  1 g Oral Daily   sodium chloride flush  3 mL Intravenous Q12H   warfarin  2.5-5 mg Oral See admin instructions    Assessment: Tracy Vargas presenting for tikosyn initiation. On warfarin for hx Afib and mechanical aortic valve - LD 2/20 AM.   PTA regimen is 2.5 mg daily except 5 mg on MWF.   INR today came back therapeutic at 2.2. No s/sx of bleeding.   Goal of Therapy:  INR 2-2.5   (reduced given hx GI bleed) Monitor platelets by anticoagulation protocol: Yes   Plan:  No warfarin tonight since confirmed with patient that took dose this morning already  Monitor daily INR, CBC,  and for s/sx of bleeding   Antonietta Jewel, PharmD, [place] Pharmacist  Phone: [phone] 12/26/2021 2:50 PM  Please check AMION for all [place] phone numbers After 10:00 PM, call [place] [phone]

## 2021-12-26 NOTE — TOC Benefit Eligibility Note (Signed)
Patient Product/process development scientist completed.    The patient is currently admitted and upon discharge could be taking dofetilide(Tikosyn) 250 mcg.  The current 30 day co-pay is, $100.00.   The patient is insured through [place] Medicare Part D     Roland Earl, CPhT Pharmacy Patient Advocate Specialist [facility] Patient Advocate Team Direct Number: [phone]  Fax: [phone]

## 2021-12-27 LAB — BASIC METABOLIC PANEL
Anion gap: 7 (ref 5–15)
BUN: 20 mg/dL (ref 8–23)
CO2: 24 mmol/L (ref 22–32)
Calcium: 8.5 mg/dL — ABNORMAL LOW (ref 8.9–10.3)
Chloride: 108 mmol/L (ref 98–111)
Creatinine, Ser: 0.72 mg/dL (ref 0.44–1.00)
GFR, Estimated: >60 mL/min (ref >60)
Glucose, Bld: 92 mg/dL (ref 70–99)
Potassium: 4 mmol/L (ref 3.5–5.1)
Sodium: 139 mmol/L (ref 135–145)

## 2021-12-27 LAB — PROTIME-INR
INR: 2.3 — ABNORMAL HIGH (ref 0.8–1.2)
Prothrombin Time: 25 seconds — ABNORMAL HIGH (ref 11.4–15.2)

## 2021-12-27 LAB — MAGNESIUM: Magnesium: 2 mg/dL (ref 1.7–2.4)

## 2021-12-27 MED ORDER — SODIUM CHLORIDE 0.9% FLUSH
10.0000 mL | Freq: Two times a day (BID) | INTRAVENOUS | Status: DC
  Administered 2021-12-27 – 2021-12-29 (×4): 10 mL

## 2021-12-27 MED ORDER — SODIUM CHLORIDE 0.9% FLUSH: 10.0000 mL | INTRAVENOUS | Status: DC | PRN

## 2021-12-27 MED ORDER — WARFARIN SODIUM 2.5 MG PO TABS
2.5000 mg | ORAL_TABLET | Freq: Once | ORAL | Status: AC
  Administered 2021-12-27: 2.5 mg via ORAL
  Filled 2021-12-27: qty 1

## 2021-12-27 MED ORDER — MAGNESIUM SULFATE 2 GM/50ML IV SOLN
2.0000 g | Freq: Once | INTRAVENOUS | Status: AC
  Administered 2021-12-27: 2 g via INTRAVENOUS
  Filled 2021-12-27: qty 50

## 2021-12-27 NOTE — Progress Notes (Signed)
Pharmacy: Dofetilide (Tikosyn) - Follow Up Assessment and Electrolyte Replacement  Pharmacy consulted to assist in monitoring and replacing electrolytes in this 88 y.o. female admitted on 12/26/2021 undergoing dofetilide initiation. First dofetilide dose: 2/20@2001 .  Labs:    Component Value Date/Time   K 4.0 12/27/2021 0502   MG 2.0 12/27/2021 0502     Plan: Potassium: K >/= 4: No additional supplementation needed  Magnesium: Mg 1.8-2: Give Mg 2 gm IV x1   Thank you for allowing pharmacy to participate in this patient's care   Antonietta Jewel, PharmD, [place] Pharmacist  Phone: [phone] 12/27/2021 7:27 AM  Please check AMION for all [place] phone numbers After 10:00 PM, call [place] [phone]

## 2021-12-27 NOTE — Progress Notes (Signed)
Electrophysiology Rounding Note  Patient Name: Tracy Vargas Date of Encounter: 12/27/2021  Primary Cardiologist: Donato Schultz, MD  Electrophysiologist: Lanier Prude, MD    Subjective   Pt remains in afib on Tikosyn 250 mcg BID   QTc from EKG last pm shows stable QTc at ~470-480  The patient is doing well today.  At this time, the patient denies chest pain, shortness of breath, or any new concerns.  Inpatient Medications    Scheduled Meds:  amLODipine  2.5 mg Oral Daily   atorvastatin  10 mg Oral q1800   diltiazem  120 mg Oral BID   dofetilide  250 mcg Oral BID   irbesartan  150 mg Oral Daily   levothyroxine  25 mcg Oral QAC breakfast   magnesium oxide  400 mg Oral Daily   omega-3 acid ethyl esters  1 g Oral Daily   sodium chloride flush  3 mL Intravenous Q12H   Warfarin - Pharmacist Dosing Inpatient   Does not apply q1600   Continuous Infusions:  sodium chloride     PRN Meds: sodium chloride, alum & mag hydroxide-simeth, furosemide, sodium chloride flush   Vital Signs    Vitals:   12/26/21 1648 12/26/21 1959 12/27/21 0044 12/27/21 0431  BP: 111/74 128/74 (!) 104/59 102/62  Pulse: 94 73 81 63  Resp: 16 19    Temp: 98 F (36.7 C) 98 F (36.7 C) 97.7 F (36.5 C) 97.8 F (36.6 C)  TempSrc: Oral Oral Oral Oral  SpO2: 98% 100% 94% 93%  Weight:      Height:        Intake/Output Summary (Last 24 hours) at 12/27/2021 0717 Last data filed at 12/27/2021 0539 Gross per 24 hour  Intake 360 ml  Output 700 ml  Net -340 ml   Filed Weights   12/26/21 1422  Weight: 67 kg    Physical Exam    GEN- The patient is well appearing, alert and oriented x 3 today.   Head- normocephalic, atraumatic Eyes-  Sclera clear, conjunctiva pink Ears- hearing intact Oropharynx- clear Neck- supple Lungs- Clear to ausculation bilaterally, normal work of breathing Heart- Irregularly irregular rate and rhythm, no murmurs, rubs or gallops GI- soft, NT, ND, + BS Extremities- no  clubbing, cyanosis, or edema Skin- no rash or lesion Psych- euthymic mood, full affect Neuro- strength and sensation are intact  Labs    CBC No results for input(s): WBC, NEUTROABS, HGB, HCT, MCV, PLT in the last 72 hours. Basic Metabolic Panel Recent Labs    35/32/99 1130 12/27/21 0502  NA 138 139  K 4.2 4.0  CL 104 108  CO2 26 24  GLUCOSE 107* 92  BUN 16 20  CREATININE 0.81 0.72  CALCIUM 9.4 8.5*  MG 2.4 2.0    Potassium  Date/Time Value Ref Range Status  12/27/2021 05:02 AM 4.0 3.5 - 5.1 mmol/L Final   Magnesium  Date/Time Value Ref Range Status  12/27/2021 05:02 AM 2.0 1.7 - 2.4 mg/dL Final    Comment:    Performed at [facility], [address], [place], [place] [address]    Telemetry    AF variable rates (personally reviewed)  Radiology    No results found.   Patient Profile     Tracy Vargas is a 88 y.o. female with a past medical history significant for persistent atrial fibrillation.  They were admitted for tikosyn load.   Assessment & Plan    Persistent atrial  fibrillation Pt remains in afib on Tikosyn 250 mcg BID  Continue Coumadin K 4.0, Mg 2.0. Supp gently. CHA2DS2VASC is at least 5.  If pt does not convert chemically, plan on DCCV tomorrow   For questions or updates, please contact [facility] Please consult [email or web address] for contact info under Cardiology/STEMI.  Signed, Graciella Freer, PA-C  12/27/2021, 7:17 AM

## 2021-12-27 NOTE — Progress Notes (Signed)
ANTICOAGULATION CONSULT NOTE  Pharmacy Consult for warfarin Indication: atrial fibrillation  Allergies  Allergen Reactions   Codeine     Halluciations   Sudafed [Pseudoephedrine Hcl]     Halluications    Patient Measurements: Height: 5\' 1"  (154.9 cm) Weight: 67 kg (147 lb 11.3 oz) IBW/kg (Calculated) : 47.8 Heparin Dosing Weight: 61.9 kg   Vital Signs: Temp: 97.8 F (36.6 C) (02/21 0431) Temp Source: Oral (02/21 0431) BP: 102/62 (02/21 0431) Pulse Rate: 63 (02/21 0431)  Labs: Recent Labs    12/26/21 1005 12/26/21 1130 12/27/21 0502  LABPROT  --   --  25.0*  INR 2.2  --  2.3*  CREATININE  --  0.81 0.72     Estimated Creatinine Clearance: 44.2 mL/min (by C-G formula based on SCr of 0.72 mg/dL).   Medical History: Past Medical History:  Diagnosis Date   Acquired hyperlipoproteinemia    Chronic diastolic heart failure ([place]) XX123456   Diastolic dysfunction    Dyspnea    HTN (hypertension)    Hyperlipidemia    Osteopenia     Medications:  Scheduled:   amLODipine  2.5 mg Oral Daily   atorvastatin  10 mg Oral q1800   diltiazem  120 mg Oral BID   dofetilide  250 mcg Oral BID   irbesartan  150 mg Oral Daily   levothyroxine  25 mcg Oral QAC breakfast   magnesium oxide  400 mg Oral Daily   omega-3 acid ethyl esters  1 g Oral Daily   sodium chloride flush  3 mL Intravenous Q12H   Warfarin - Pharmacist Dosing Inpatient   Does not apply q1600    Assessment: 86 yof presenting for tikosyn initiation. On warfarin for hx Afib and mechanical aortic valve - LD 2/20 AM.   PTA regimen is 2.5 mg daily except 5 mg on MWF.   INR today came back therapeutic at 2.3. No s/sx of bleeding. Oral intake documented at 100% yesterday for 1 meal.   Goal of Therapy:  INR 2-2.5   (reduced given hx GI bleed) Monitor platelets by anticoagulation protocol: Yes   Plan:  Will continue home dosing of warfarin 2.5 mg tonight  Monitor daily INR, CBC, and for s/sx of bleeding    Antonietta Jewel, PharmD, [place] Pharmacist  Phone: [phone] 12/27/2021 7:27 AM  Please check AMION for all [place] phone numbers After 10:00 PM, call [place] [phone]

## 2021-12-27 NOTE — Progress Notes (Signed)

## 2021-12-27 NOTE — Plan of Care (Signed)
°  Problem: Education: Goal: Knowledge of disease or condition will improve Outcome: Progressing   Problem: Education: Goal: Understanding of medication regimen will improve Outcome: Progressing   Problem: Cardiac: Goal: Ability to achieve and maintain adequate cardiopulmonary perfusion will improve Outcome: Progressing   Problem: Clinical Measurements: Goal: Cardiovascular complication will be avoided Outcome: Progressing   Problem: Safety: Goal: Ability to remain free from injury will improve Outcome: Progressing

## 2021-12-27 NOTE — Progress Notes (Signed)
Morning EKG reviewed    Shows pt remains in afib at 76 bpm with borderline QTc at ~490 ms, though appears better when measured manually. Will review with Dr. Lalla Brothers  Continue  Tikosyn 250 mcg BID.   Pt will be NPO after midnight for DCCV if remains in afib   Graciella Freer, [place]  Pager: [phone]  12/27/2021 12:14 PM

## 2021-12-28 ENCOUNTER — Encounter (HOSPITAL_COMMUNITY): Payer: Self-pay | Admitting: Certified Registered"

## 2021-12-28 ENCOUNTER — Encounter (HOSPITAL_COMMUNITY)
Admission: AD | Disposition: A | Discharge status: Home / Self Care | Payer: Self-pay | Source: Ambulatory Visit | Attending: Internal Medicine

## 2021-12-28 LAB — BASIC METABOLIC PANEL
Anion gap: 8 (ref 5–15)
BUN: 18 mg/dL (ref 8–23)
CO2: 24 mmol/L (ref 22–32)
Calcium: 8.4 mg/dL — ABNORMAL LOW (ref 8.9–10.3)
Chloride: 108 mmol/L (ref 98–111)
Creatinine, Ser: 0.83 mg/dL (ref 0.44–1.00)
GFR, Estimated: >60 mL/min (ref >60)
Glucose, Bld: 90 mg/dL (ref 70–99)
Potassium: 4.3 mmol/L (ref 3.5–5.1)
Sodium: 140 mmol/L (ref 135–145)

## 2021-12-28 LAB — PROTIME-INR
INR: 2 — ABNORMAL HIGH (ref 0.8–1.2)
Prothrombin Time: 22.3 seconds — ABNORMAL HIGH (ref 11.4–15.2)

## 2021-12-28 LAB — MAGNESIUM: Magnesium: 2.2 mg/dL (ref 1.7–2.4)

## 2021-12-28 SURGERY — CARDIOVERSION
Anesthesia: General

## 2021-12-28 MED ORDER — SODIUM CHLORIDE 0.9 % IV SOLN: INTRAVENOUS | Status: DC

## 2021-12-28 MED ORDER — WARFARIN SODIUM 5 MG PO TABS
5.0000 mg | ORAL_TABLET | Freq: Once | ORAL | Status: AC
  Administered 2021-12-28: 5 mg via ORAL
  Filled 2021-12-28: qty 1

## 2021-12-28 NOTE — Progress Notes (Addendum)
Electrophysiology Rounding Note  Patient Name: Tracy Vargas Date of Encounter: 12/28/2021  Primary Cardiologist: Donato Schultz, MD  Electrophysiologist: Lanier Prude, MD    Subjective   Pt remains in afib on Tikosyn 250 mcg BID   QTc from EKG last pm shows stable QTc at ~480-490  The patient is doing well today.  At this time, the patient denies chest pain, shortness of breath, or any new concerns.  Inpatient Medications    Scheduled Meds:  amLODipine  2.5 mg Oral Daily   atorvastatin  10 mg Oral q1800   diltiazem  120 mg Oral BID   dofetilide  250 mcg Oral BID   irbesartan  150 mg Oral Daily   levothyroxine  25 mcg Oral QAC breakfast   magnesium oxide  400 mg Oral Daily   omega-3 acid ethyl esters  1 g Oral Daily   sodium chloride flush  10-40 mL Intracatheter Q12H   sodium chloride flush  3 mL Intravenous Q12H   warfarin  5 mg Oral ONCE-1600   Warfarin - Pharmacist Dosing Inpatient   Does not apply q1600   Continuous Infusions:  sodium chloride     sodium chloride 20 mL/hr at 12/28/21 0642   PRN Meds: sodium chloride, alum & mag hydroxide-simeth, furosemide, sodium chloride flush, sodium chloride flush   Vital Signs    Vitals:   12/27/21 0754 12/27/21 0909 12/27/21 2048 12/28/21 0448  BP: 99/61 113/67 129/71 (!) 107/57  Pulse: 79 81 91 89  Resp: 18 18  19   Temp: 98 F (36.7 C) 97.9 F (36.6 C) 97.9 F (36.6 C) 98.1 F (36.7 C)  TempSrc: Oral Oral Oral Axillary  SpO2: 98% 98% 98% 99%  Weight:      Height:        Intake/Output Summary (Last 24 hours) at 12/28/2021 0739 Last data filed at 12/28/2021 0453 Gross per 24 hour  Intake 720 ml  Output 600 ml  Net 120 ml   Filed Weights   12/26/21 1422  Weight: 67 kg    Physical Exam    GEN- The patient is well appearing, alert and oriented x 3 today.   Head- normocephalic, atraumatic Eyes-  Sclera clear, conjunctiva pink Ears- hearing intact Oropharynx- clear Neck- supple Lungs- Clear to  ausculation bilaterally, normal work of breathing Heart- Irregularly irregular rate and rhythm, no murmurs, rubs or gallops GI- soft, NT, ND, + BS Extremities- no clubbing, cyanosis, or edema Skin- no rash or lesion Psych- euthymic mood, full affect Neuro- strength and sensation are intact  Labs    CBC No results for input(s): WBC, NEUTROABS, HGB, HCT, MCV, PLT in the last 72 hours. Basic Metabolic Panel Recent Labs    12/28/21 0502 12/28/21 0455  NA 139 140  K 4.0 4.3  CL 108 108  CO2 24 24  GLUCOSE 92 90  BUN 20 18  CREATININE 0.72 0.83  CALCIUM 8.5* 8.4*  MG 2.0 2.2    Potassium  Date/Time Value Ref Range Status  12/28/2021 04:55 AM 4.3 3.5 - 5.1 mmol/L Final   Magnesium  Date/Time Value Ref Range Status  12/28/2021 04:55 AM 2.2 1.7 - 2.4 mg/dL Final    Comment:    Performed at [facility], [address] [place], [place]    Telemetry    AF 70-90s mostly (personally reviewed)  Radiology    No results found.   Patient Profile     Tracy Vargas is a  88 y.o. female with a past medical history significant for persistent atrial fibrillation.  They were admitted for tikosyn load.   Assessment & Plan    Persistent atrial fibrillation Pt remains in afib on Tikosyn 250 mcg BID  Continue Coumadin Electrolytes stable.  CHA2DS2VASC is at least 5.   If pt does not convert chemically, plan on DCCV this afternoon.    For questions or updates, please contact [facility] Please consult [email or web address] for contact info under Cardiology/STEMI.  Signed, Graciella Freer, PA-C  12/28/2021, 7:39 AM   I have seen and examined this patient with Otilio Saber.  Agree with above, note added to reflect my findings.  Patient feeling well.  Remains in atrial fibrillation.  Ready for cardioversion.  GEN: Well nourished, well developed, in no acute distress  HEENT: normal  Neck: no JVD, carotid bruits, or masses Cardiac: Irregular; no murmurs,  rubs, or gallops,no edema  Respiratory:  clear to auscultation bilaterally, normal work of breathing GI: soft, nontender, nondistended, + BS MS: no deformity or atrophy  Skin: warm and dry Neuro:  Strength and sensation are intact Psych: euthymic mood, full affect   Persistent atrial fibrillation: Currently on dofetilide 250 mcg twice daily.  Plan for cardioversion today.  Taaj Hurlbut M. Alfard Cochrane MD 12/28/2021 10:48 AM

## 2021-12-28 NOTE — Care Management (Addendum)
1408 12-28-21 Case Manager spoke with the patient on yesterday regarding Tikosyn cost. Patient states the cost is expensive and would like to use the Good Rx. Initial Rx to be sent to [facility] and delivered to the bedside. Patient would like Rx refills 90 day supply to be sent to [facility] in [place]. Patient will utilize her computer to see which pharmacy is the cheapest. Case Manager will provide the patient with a Good Rx card. No further needs from Case Manager at this time.

## 2021-12-28 NOTE — Progress Notes (Signed)
Morning EKG reviewed    Shows has converted to NSR at 61 bpm with borderline QTc at ~480 ms.  Continue  Tikosyn 250 mcg BID.   Plan for home tomorrow if QTc remains stable.   Shirley Friar, PA-C  Pager: [phone]  12/28/2021 11:55 AM

## 2021-12-28 NOTE — Progress Notes (Signed)
Pt down to unit . Pt in SR as admitted to endo. Dr. Malen Gauze and Dr. Elease Hashimoto to bedside and examined and saw rhythm and 12 lead and agree pt in SR. Cardioversion cancelled

## 2021-12-28 NOTE — Progress Notes (Signed)
Pharmacy: Dofetilide (Tikosyn) - Follow Up Assessment and Electrolyte Replacement  Pharmacy consulted to assist in monitoring and replacing electrolytes in this 88 y.o. female admitted on 12/26/2021 undergoing dofetilide initiation. First dofetilide dose: 2/20@2001 .  Labs:    Component Value Date/Time   K 4.3 12/28/2021 0455   MG 2.2 12/28/2021 0455     Plan: Potassium: K >/= 4: No additional supplementation needed  Magnesium: Mg > 2: No additional supplementation needed  Thank you for allowing pharmacy to participate in this patient's care   Sherron Monday, PharmD, BCCCP Clinical Pharmacist  Phone: [phone] 12/28/2021 7:28 AM  Please check AMION for all [facility] Pharm[facility] phone numbers After 10:00 PM, call Main Pharmacy [phone]

## 2021-12-28 NOTE — Progress Notes (Signed)
ANTICOAGULATION CONSULT NOTE  Pharmacy Consult for warfarin Indication: atrial fibrillation  Allergies  Allergen Reactions   Codeine     Halluciations   Sudafed [Pseudoephedrine Hcl]     Halluications    Patient Measurements: Height: 5\' 1"  (154.9 cm) Weight: 67 kg (147 lb 11.3 oz) IBW/kg (Calculated) : 47.8 Heparin Dosing Weight: 61.9 kg   Vital Signs: Temp: 98.1 F (36.7 C) (02/22 0448) Temp Source: Axillary (02/22 0448) BP: 107/57 (02/22 0448) Pulse Rate: 89 (02/22 0448)  Labs: Recent Labs    12/26/21 1005 12/26/21 1130 12/27/21 0502 12/28/21 0455  LABPROT  --   --  25.0* 22.3*  INR 2.2  --  2.3* 2.0*  CREATININE  --  0.81 0.72 0.83     Estimated Creatinine Clearance: 42.6 mL/min (by C-G formula based on SCr of 0.83 mg/dL).   Medical History: Past Medical History:  Diagnosis Date   Acquired hyperlipoproteinemia    Chronic diastolic heart failure ([place]) [id number]   Diastolic dysfunction    Dyspnea    HTN (hypertension)    Hyperlipidemia    Osteopenia     Medications:  Scheduled:   amLODipine  2.5 mg Oral Daily   atorvastatin  10 mg Oral q1800   diltiazem  120 mg Oral BID   dofetilide  250 mcg Oral BID   irbesartan  150 mg Oral Daily   levothyroxine  25 mcg Oral QAC breakfast   magnesium oxide  400 mg Oral Daily   omega-3 acid ethyl esters  1 g Oral Daily   sodium chloride flush  10-40 mL Intracatheter Q12H   sodium chloride flush  3 mL Intravenous Q12H   Warfarin - Pharmacist Dosing Inpatient   Does not apply q1600    Assessment: 66 yof presenting for tikosyn initiation. On warfarin for hx Afib and mechanical aortic valve - LD 2/20 AM.   PTA regimen is 2.5 mg daily except 5 mg on MWF.   INR today came back therapeutic at 2. No s/sx of bleeding. Oral intake documented at 100% yesterday. No new drug drug interactions.   Goal of Therapy:  INR 2-2.5   (reduced given hx GI bleed) Monitor platelets by anticoagulation protocol: Yes   Plan:   Will continue home dosing of warfarin 5 mg tonight  Monitor daily INR, CBC, and for s/sx of bleeding   Antonietta Jewel, PharmD, BCCCP Clinical Pharmacist  Phone: [phone] 12/28/2021 7:28 AM  Please check AMION for all [place] phone numbers After 10:00 PM, call [place] [phone]

## 2021-12-29 ENCOUNTER — Other Ambulatory Visit (HOSPITAL_COMMUNITY): Payer: Self-pay

## 2021-12-29 LAB — BASIC METABOLIC PANEL
Anion gap: 6 (ref 5–15)
Anion gap: 7 (ref 5–15)
BUN: 12 mg/dL (ref 8–23)
BUN: 18 mg/dL (ref 8–23)
CO2: 19 mmol/L — ABNORMAL LOW (ref 22–32)
CO2: 24 mmol/L (ref 22–32)
Calcium: 6.6 mg/dL — ABNORMAL LOW (ref 8.9–10.3)
Calcium: 8.6 mg/dL — ABNORMAL LOW (ref 8.9–10.3)
Chloride: 115 mmol/L — ABNORMAL HIGH (ref 98–111)
Chloride: 105 mmol/L (ref 98–111)
Creatinine, Ser: 0.73 mg/dL (ref 0.44–1.00)
Creatinine, Ser: 0.87 mg/dL (ref 0.44–1.00)
GFR, Estimated: >60 mL/min (ref >60)
GFR, Estimated: >60 mL/min (ref >60)
Glucose, Bld: 74 mg/dL (ref 70–99)
Glucose, Bld: 115 mg/dL — ABNORMAL HIGH (ref 70–99)
Potassium: 3.3 mmol/L — ABNORMAL LOW (ref 3.5–5.1)
Potassium: 4.1 mmol/L (ref 3.5–5.1)
Sodium: 140 mmol/L (ref 135–145)
Sodium: 136 mmol/L (ref 135–145)

## 2021-12-29 LAB — MAGNESIUM
Magnesium: 1.7 mg/dL (ref 1.7–2.4)
Magnesium: 2.9 mg/dL — ABNORMAL HIGH (ref 1.7–2.4)

## 2021-12-29 LAB — PROTIME-INR
INR: 1.8 — ABNORMAL HIGH (ref 0.8–1.2)
Prothrombin Time: 20.7 seconds — ABNORMAL HIGH (ref 11.4–15.2)

## 2021-12-29 MED ORDER — WARFARIN SODIUM 2 MG PO TABS
4.0000 mg | ORAL_TABLET | Freq: Once | ORAL | Status: AC
  Administered 2021-12-29: 4 mg via ORAL
  Filled 2021-12-29: qty 2

## 2021-12-29 MED ORDER — MAGNESIUM SULFATE 4 GM/100ML IV SOLN
4.0000 g | Freq: Once | INTRAVENOUS | Status: AC
  Administered 2021-12-29: 4 g via INTRAVENOUS
  Filled 2021-12-29: qty 100

## 2021-12-29 MED ORDER — POTASSIUM CHLORIDE CRYS ER 20 MEQ PO TBCR
40.0000 meq | EXTENDED_RELEASE_TABLET | ORAL | Status: DC
  Administered 2021-12-29: 40 meq via ORAL
  Filled 2021-12-29: qty 2

## 2021-12-29 MED ORDER — WARFARIN SODIUM 2 MG PO TABS: 4.0000 mg | ORAL_TABLET | Freq: Once | ORAL | Status: DC

## 2021-12-29 MED ORDER — DOFETILIDE 250 MCG PO CAPS
250.0000 ug | ORAL_CAPSULE | Freq: Two times a day (BID) | ORAL | 6 refills | Status: DC
Start: 2021-12-29 — End: 2022-01-18
  Filled 2021-12-29: qty 60, 30d supply, fill #0

## 2021-12-29 MED ORDER — WARFARIN SODIUM 5 MG PO TABS
2.5000 mg | ORAL_TABLET | ORAL | Status: DC
Start: 2021-12-30 — End: 2022-01-25

## 2021-12-29 NOTE — Progress Notes (Addendum)
ANTICOAGULATION CONSULT NOTE  Pharmacy Consult for warfarin Indication: atrial fibrillation  Allergies  Allergen Reactions   Codeine     Halluciations   Sudafed [Pseudoephedrine Hcl]     Halluications    Patient Measurements: Height: 5\' 1"  (154.9 cm) Weight: 67 kg (147 lb 11.3 oz) IBW/kg (Calculated) : 47.8 Heparin Dosing Weight: 61.9 kg   Vital Signs: Temp: 98.2 F (36.8 C) (02/23 0447) Temp Source: Oral (02/23 0447) BP: 114/50 (02/23 0447) Pulse Rate: 72 (02/23 0447)  Labs: Recent Labs    12/27/21 0502 12/28/21 0455 12/29/21 0513  LABPROT 25.0* 22.3* 20.7*  INR 2.3* 2.0* 1.8*  CREATININE 0.72 0.83 0.73     Estimated Creatinine Clearance: 44.2 mL/min (by C-G formula based on SCr of 0.73 mg/dL).   Medical History: Past Medical History:  Diagnosis Date   Acquired hyperlipoproteinemia    Chronic diastolic heart failure ([facility]) 09/01/2013   Diastolic dysfunction    Dyspnea    HTN (hypertension)    Hyperlipidemia    Osteopenia     Medications:  Scheduled:   amLODipine  2.5 mg Oral Daily   atorvastatin  10 mg Oral q1800   diltiazem  120 mg Oral BID   dofetilide  250 mcg Oral BID   irbesartan  150 mg Oral Daily   levothyroxine  25 mcg Oral QAC breakfast   magnesium oxide  400 mg Oral Daily   omega-3 acid ethyl esters  1 g Oral Daily   potassium chloride  40 mEq Oral Q4H   sodium chloride flush  10-40 mL Intracatheter Q12H   sodium chloride flush  3 mL Intravenous Q12H   Warfarin - Pharmacist Dosing Inpatient   Does not apply q1600    Assessment: 9 yof presenting for tikosyn initiation. On warfarin for hx Afib and mechanical aortic valve - LD 2/20 AM.   PTA regimen is 2.5 mg daily except 5 mg on MWF.   INR today came back subtherapeutic at 1.8. No s/sx of bleeding. Oral intake documented at 100% yesterday. No new drug drug interactions.   Goal of Therapy:  INR 2-2.5   (reduced given hx GI bleed) Monitor platelets by anticoagulation protocol: Yes    Plan:  Will give higher than normal PTA dose of 4 mg tonight  Monitor daily INR, CBC, and for s/sx of bleeding  If plan for discharge, would give today dose prior to discharge and restart PTA regimen starting tomorrow   3/20, PharmD, BCCCP Clinical Pharmacist  Phone: [phone] 12/29/2021 7:22 AM  Please check AMION for all [facility] Pharmacy phone numbers After 10:00 PM, call Main Pharmacy [phone]

## 2021-12-29 NOTE — Progress Notes (Signed)
EKG from yesterday evening 12/28/2021 reviewed    Shows remains in NSR at 77 bpm with stable QTc at ~460-470 ms when measured manually  Continue  Tikosyn 250 mcg BID.   Home today if QTc remains stable.   INR 1.8. Received 5 mg last night, to get 4 tonight, and 5 mg again tomorrow. Discussed with Pharm-D who does not see need for bridge at this time, but ok for home with close check early next week.  Will confirm with MD.    K 3.3. Mg 1.7.   Her electrolytes are significantly changed from prior and she has not required any. I am going to repeat as well as supp.   Graciella Freer, PA-C  Pager: [phone]  12/29/2021 8:10 AM

## 2021-12-29 NOTE — Care Management Important Message (Signed)
Important Message  Patient Details  Name: Tracy Vargas MRN: [id number] Date of Birth: [birth year]   Medicare Important Message Given:  Yes     Renie Ora 12/29/2021, 8:37 AM

## 2021-12-29 NOTE — Discharge Summary (Signed)
ELECTROPHYSIOLOGY PROCEDURE DISCHARGE SUMMARY    Patient ID: Tracy Vargas,  MRN: [id number], DOB/AGE: [birth year] 88 y.o.  Admit date: 12/26/2021 Discharge date: 12/29/2021  Primary Care Physician: Merri Brunette, MD  Primary Cardiologist: Donato Schultz, MD  Electrophysiologist: Lanier Prude, MD   Primary Discharge Diagnosis:  1.  Persistent atrial fibrillation status post Tikosyn loading this admission  Allergies  Allergen Reactions   Codeine     Halluciations   Sudafed [Pseudoephedrine Hcl]     Halluications     Procedures This Admission:  1.  Tikosyn loading  Brief HPI: Tracy Vargas is a 88 y.o. female with a past medical history as noted above.  They were referred to EP in the outpatient setting for treatment options of atrial fibrillation.  Risks, benefits, and alternatives to Tikosyn were reviewed with the patient who wished to proceed.    Hospital Course:  The patient was admitted and Tikosyn was initiated.  Renal function and electrolytes were followed during the hospitalization.  Their QTc remained stable.  She was scheduled for cardioversion 12/28/21, but converted to sinus on meds alone that am. She was monitored until discharge on telemetry which demonstrated NSR.  On the day of discharge, they were examined by Dr. Lalla Brothers  who considered them stable for discharge to home.  Follow-up has been arranged with the Atrial Fibrillation clinic in approximately 1 week and with  EP APP  in 4 weeks.   On day of discharge she had electrolyte abnormalities that were inconsistent with her previous labs.  Repeat labwork showed stable values (Mg likely somewhat elevated with recent Mg infusion through same line despite multiple flushing.). Reviewed with Dr. Lalla Brothers who agreed no additional supp needed at this time and OK for discharge.  She did have mild bruising around insertion site of Midline. Re-assurance given that irritation was unrelated to med administration in same, with  port terminating/med infusing in proximal circulation and not at insertion site.   IV team reviewed site and recommended discontinuation of Midline. No need to replace with discharge today.   Physical Exam: Vitals:   12/28/21 2339 12/29/21 0447 12/29/21 0800 12/29/21 0900  BP: 124/65 (!) 114/50 (!) 95/47 (!) 113/50  Pulse: 75 72    Resp: 18  18 18   Temp: 97.9 F (36.6 C) 98.2 F (36.8 C)    TempSrc: Oral Oral    SpO2: 92%     Weight:      Height:        GEN- The patient is well appearing, alert and oriented x 3 today.   HEENT: normocephalic, atraumatic; sclera clear, conjunctiva pink; hearing intact; oropharynx clear; neck supple, no JVP Lymph- no cervical lymphadenopathy Lungs- Clear to ausculation bilaterally, normal work of breathing.  No wheezes, rales, rhonchi Heart- Regular rate and rhythm, no murmurs, rubs or gallops, PMI not laterally displaced GI- soft, non-tender, non-distended, bowel sounds present, no hepatosplenomegaly Extremities- no clubbing, cyanosis, or edema; DP/PT/radial pulses 2+ bilaterally MS- no significant deformity or atrophy Skin- warm and dry, no rash or lesion Psych- euthymic mood, full affect Neuro- strength and sensation are intact   Labs:   Lab Results  Component Value Date   WBC 15.5 (H) 04/05/2020   HGB 12.6 08/16/2021   HCT 37.0 08/16/2021   MCV 85.7 04/05/2020   PLT 326 04/05/2020    Recent Labs  Lab 12/29/21 0900  NA 136  K 4.1  CL 105  CO2 24  BUN 18  CREATININE  0.87  CALCIUM 8.6*  GLUCOSE 115*     Discharge Medications:  Allergies as of 12/29/2021       Reactions   Codeine    Halluciations   Sudafed [pseudoephedrine Hcl]    Halluications        Medication List     TAKE these medications    amLODipine 2.5 MG tablet Commonly known as: NORVASC Take 2.5 mg by mouth daily.   atorvastatin 10 MG tablet Commonly known as: LIPITOR Take 10 mg by mouth daily at 6 PM.   b complex vitamins capsule Take 1 capsule  by mouth daily.   Biotin 5000 MCG Tabs Take 5,000 mcg by mouth once a week.   calcium citrate-vitamin D 315-200 MG-UNIT tablet Commonly known as: CITRACAL+D Take 1 tablet by mouth at bedtime.   diltiazem 120 MG 12 hr capsule Commonly known as: CARDIZEM SR Take 1 capsule (120 mg total) by mouth 2 (two) times daily.   docusate sodium 250 MG capsule Commonly known as: COLACE Take 250 mg by mouth daily as needed for constipation.   dofetilide 250 MCG capsule Commonly known as: TIKOSYN Take 1 capsule (250 mcg total) by mouth 2 (two) times daily.   ferrous sulfate 325 (65 FE) MG tablet Take 325 mg by mouth once a week.   fluticasone 50 MCG/ACT nasal spray Commonly known as: FLONASE Place 1 spray into both nostrils daily for 3 days. What changed:  when to take this reasons to take this   furosemide 20 MG tablet Commonly known as: LASIX Take 1 tablet (20 mg total) by mouth daily as needed for edema (Takes when not leaving the house).   levothyroxine 25 MCG tablet Commonly known as: SYNTHROID Take 25 mcg by mouth daily before breakfast.   Magnesium 400 MG Caps Take 400 mg by mouth every evening. Triple complex   Mylanta Maximum Strength 400-400-40 MG/5ML suspension Generic drug: alum & mag hydroxide-simeth Take 15 mLs by mouth every 6 (six) hours as needed for indigestion.   omega-3 acid ethyl esters 1 g capsule Commonly known as: LOVAZA Take 1 g by mouth daily.   OVER THE COUNTER MEDICATION Take 1 capsule by mouth 3 (three) times daily with meals. LipoSan Ultra Chitosan   polyethylene glycol 17 g packet Commonly known as: MIRALAX / GLYCOLAX Take 17 g by mouth daily.   Systane 0.4-0.3 % Soln Generic drug: Polyethyl Glycol-Propyl Glycol Place 1 drop into both eyes 2 (two) times daily.   Tyrvaya 0.03 MG/ACT Soln Generic drug: Varenicline Tartrate Place 1 spray into both nostrils daily as needed (Allergies).   valsartan 160 MG tablet Commonly known as:  Diovan Take 1 tablet (160 mg total) by mouth daily.   vitamin C 1000 MG tablet Take 1,000 mg by mouth daily.   Vitamin D 50 MCG (2000 UT) Caps Take 2,000 Units by mouth daily.   warfarin 5 MG tablet Commonly known as: Jantoven Take as directed. If you are unsure how to take this medication, talk to your nurse or doctor. Original instructions: Take 0.5-1 tablets (2.5-5 mg total) by mouth See admin instructions. Take 2.5 mg on Tuesday and Thursday. Take 5 mg all the other days Start taking on: December 30, 2021 What changed: additional instructions   zinc gluconate 50 MG tablet Take 50 mg by mouth daily.        Disposition:    Follow-up Information     Newman Nip, NP Follow up.   Specialties: Nurse Practitioner, Cardiology Why:  on 3/2 at 230 pm for post hospital tikosyn f/u Contact information: [address] [place] [address] [phone]         [facility] Follow up.   Specialty: Cardiology Why: Monday, 2/27 at 1015 for INR check Contact information: [address] [place] [address] [phone]                Duration of Discharge Encounter: Greater than 30 minutes including physician time.  Dustin Flock, PA-C  12/29/2021 11:35 AM

## 2021-12-29 NOTE — Progress Notes (Signed)
Notified PA Tillery at bedside of soft BP 95/47, instructed to hold Amlodipine and give all other morning meds at this time. Pt not dizzy or lightheaded. Will continue to monitor.

## 2021-12-29 NOTE — Progress Notes (Addendum)
Pharmacy: Dofetilide (Tikosyn) - Follow Up Assessment and Electrolyte Replacement  Pharmacy consulted to assist in monitoring and replacing electrolytes in this 88 y.o. female admitted on 12/26/2021 undergoing dofetilide initiation. First dofetilide dose: 2/20@2001 .  Labs:    Component Value Date/Time   K 3.3 (L) 12/29/2021 0513   MG 1.7 12/29/2021 0513     Plan: Potassium: K < 3.5:  Give KCl 40 mEq q4hr x2   Magnesium: Mg 1.3-1.7: Give Mg 4 gm IV x1  As patient has required on average 10 mEq of potassium replacement every day, recommend discharging patient with prescription for:  Potassium chloride 10 mEq  daily Required 6 g IV Mag during initiation - could consider increase Mag from daily to BID dosing   Thank you for allowing pharmacy to participate in this patient's care   Sherron Monday, PharmD, BCCCP Clinical Pharmacist  Phone: [phone] 12/29/2021 7:21 AM  Please check AMION for all [facility] Pharm[facility] phone numbers After 10:00 PM, call Main Pharmacy [phone]   ADDENDUM Recheck labs came back at K 4.1, Mag 2.9 - of note, did receive 40 mEq and 4g IV prior/during lab draw. Discussed with team and okay to cancel second dose of potassium supplementation.

## 2022-01-02 ENCOUNTER — Other Ambulatory Visit: Payer: Self-pay

## 2022-01-02 ENCOUNTER — Ambulatory Visit: Payer: Medicare HMO

## 2022-01-02 DIAGNOSIS — Z5181 Encounter for therapeutic drug level monitoring: Secondary | ICD-10-CM | POA: Diagnosis not present

## 2022-01-02 DIAGNOSIS — Z952 Presence of prosthetic heart valve: Secondary | ICD-10-CM | POA: Diagnosis not present

## 2022-01-02 LAB — POCT INR: INR: 1.8 — AB (ref 2.0–3.0)

## 2022-01-02 NOTE — Patient Instructions (Signed)
TAKE 1.5 TABLETS TODAY ONLY and then continue taking 1/2 tablet daily except 1 tablet on Monday, Wednesday, and Friday.  INR in 5 weeks.   Coumadin Clinic [phone]

## 2022-01-05 ENCOUNTER — Ambulatory Visit (HOSPITAL_COMMUNITY)
Admit: 2022-01-05 | Discharge: 2022-01-05 | Disposition: A | Discharge status: Home / Self Care | Payer: Medicare HMO | Source: Ambulatory Visit | Attending: Nurse Practitioner | Admitting: Nurse Practitioner

## 2022-01-05 ENCOUNTER — Encounter (HOSPITAL_COMMUNITY): Payer: Self-pay | Admitting: Nurse Practitioner

## 2022-01-05 ENCOUNTER — Other Ambulatory Visit: Payer: Self-pay

## 2022-01-05 VITALS — BP 132/50 | HR 66 | Ht 61.0 in | Wt 144.4 lb

## 2022-01-05 DIAGNOSIS — Z952 Presence of prosthetic heart valve: Secondary | ICD-10-CM | POA: Insufficient documentation

## 2022-01-05 DIAGNOSIS — I110 Hypertensive heart disease with heart failure: Secondary | ICD-10-CM | POA: Diagnosis not present

## 2022-01-05 DIAGNOSIS — D6869 Other thrombophilia: Secondary | ICD-10-CM

## 2022-01-05 DIAGNOSIS — Z7901 Long term (current) use of anticoagulants: Secondary | ICD-10-CM | POA: Diagnosis not present

## 2022-01-05 DIAGNOSIS — I5032 Chronic diastolic (congestive) heart failure: Secondary | ICD-10-CM | POA: Insufficient documentation

## 2022-01-05 DIAGNOSIS — I4819 Other persistent atrial fibrillation: Secondary | ICD-10-CM

## 2022-01-05 LAB — MAGNESIUM: Magnesium: 2.5 mg/dL — ABNORMAL HIGH (ref 1.7–2.4)

## 2022-01-05 LAB — BASIC METABOLIC PANEL
Anion gap: 8 (ref 5–15)
BUN: 20 mg/dL (ref 8–23)
CO2: 27 mmol/L (ref 22–32)
Calcium: 9.5 mg/dL (ref 8.9–10.3)
Chloride: 105 mmol/L (ref 98–111)
Creatinine, Ser: 0.89 mg/dL (ref 0.44–1.00)
GFR, Estimated: >60 mL/min (ref >60)
Glucose, Bld: 126 mg/dL — ABNORMAL HIGH (ref 70–99)
Potassium: 5.2 mmol/L — ABNORMAL HIGH (ref 3.5–5.1)
Sodium: 140 mmol/L (ref 135–145)

## 2022-01-05 NOTE — Progress Notes (Signed)
Primary Care Physician: Merri Brunette, MD Referring Physician:Dr. Lalla Brothers Cardiologist: Dr. Shan Levans is a 88 y.o. female with a h/o recently persistent afib, HTN, chronic diastolic HF, mechanical aortic valve replacement, on wafarin, that is in the afib cliic to discuss optios to restore SR. She took amiodarone ordered by Dr. Anne Fu , but then read the side effects and stopped drug after 10 days. She then saw Dr. Lalla Brothers to discuss options and he wanted her to come in for tikosyn. But then she checked with Medicare and found out it would cost her $300 dollars a day  while in the hospital so she did not want to do that. She also has concerns that her husband would have to stay alone and would require someone to check in on him. She reports that since  the dose of diltiazem was doubled, she has felt much better.  She has recently finished antibiotics for pneumonia.   Pt is now back in the afib clinic after she rethought her options and wanted to go ahead with Tikosyn admission. She has had 4 therapeutic warfain levels this am at 2.2. Covid negative.  She has stopped passion flower -valerian pill and ureic acid cleanse and leg vein supplements, and  tart cherry supplement. She remains in afib at 129 bpm. Qtc 489 ms. Last qt in SR was 469 ms. Her husband is currently short term  in a SNF so she does not have to worry about her home care  at the current time. Her crcl cal is 51.76 indicating that 250 mcg bid will be her appropriate dose of tikosyn.   F/u in afib clinic, 01/05/22 after Tikosyn admit. Remains in SR with stable qt. She feels great!   Today, she denies symptoms of palpitations, chest pain, shortness of breath, orthopnea, PND, lower extremity edema, dizziness, presyncope, syncope, or neurologic sequela. The patient is tolerating medications without difficulties and is otherwise without complaint today.   Past Medical History:  Diagnosis Date   Acquired hyperlipoproteinemia     Chronic diastolic heart failure (HCC) 09/01/2013   Diastolic dysfunction    Dyspnea    HTN (hypertension)    Hyperlipidemia    Osteopenia    Past Surgical History:  Procedure Laterality Date   APPENDECTOMY     CARDIAC VALVE REPLACEMENT     CARDIOVERSION N/A 08/24/2021   Procedure: CARDIOVERSION;  Surgeon: Quintella Reichert, MD;  Location: [facility] ENDOSCOPY;  Service: Cardiovascular;  Laterality: N/A;   CATARACT EXTRACTION     ESOPHAGOGASTRODUODENOSCOPY N/A 07/14/2016   Procedure: ESOPHAGOGASTRODUODENOSCOPY (EGD);  Surgeon: Dorena Cookey, MD;  Location: [facility] ENDOSCOPY;  Service: Endoscopy;  Laterality: N/A;   ROTATOR CUFF REPAIR     TEE WITHOUT CARDIOVERSION N/A 08/24/2021   Procedure: TRANSESOPHAGEAL ECHOCARDIOGRAM (TEE);  Surgeon: Quintella Reichert, MD;  Location: [facility] ENDOSCOPY;  Service: Cardiovascular;  Laterality: N/A;   TONSILLECTOMY      Current Outpatient Medications  Medication Sig Dispense Refill   alum & mag hydroxide-simeth (MYLANTA MAXIMUM STRENGTH) 400-400-40 MG/5ML suspension Take 15 mLs by mouth every 6 (six) hours as needed for indigestion.     Alum & Mag Hydroxide-Simeth (MYLANTA PO) Powder adds to her juice as needed     amLODipine (NORVASC) 2.5 MG tablet Take 2.5 mg by mouth daily.     Ascorbic Acid (VITAMIN C) 1000 MG tablet Take 1,000 mg by mouth daily.     atorvastatin (LIPITOR) 10 MG tablet Take 10 mg by mouth daily at  6 PM.      b complex vitamins capsule Take 1 capsule by mouth daily.     Biotin 5000 MCG TABS Take 5,000 mcg by mouth once a week.     calcium citrate-vitamin D (CITRACAL+D) 315-200 MG-UNIT tablet Take 1 tablet by mouth at bedtime.     Cholecalciferol (VITAMIN D) 50 MCG (2000 UT) CAPS Take 2,000 Units by mouth daily.     diltiazem (CARDIZEM SR) 120 MG 12 hr capsule Take 1 capsule (120 mg total) by mouth 2 (two) times daily. 180 capsule 3   docusate sodium (COLACE) 250 MG capsule Take 250 mg by mouth as needed for constipation.     dofetilide (TIKOSYN) 250 MCG  capsule Take 1 capsule (250 mcg total) by mouth 2 (two) times daily. 60 capsule 6   furosemide (LASIX) 20 MG tablet Take 1 tablet (20 mg total) by mouth daily as needed for edema (Takes when not leaving the house).     levothyroxine (SYNTHROID) 50 MCG tablet Take 50 mcg by mouth daily.     LORazepam (ATIVAN) 0.5 MG tablet Take 0.25 mg by mouth every 8 (eight) hours as needed.     Magnesium 400 MG CAPS Take 400 mg by mouth every evening. Triple complex     omega-3 acid ethyl esters (LOVAZA) 1 g capsule Take 1 g by mouth daily.     OVER THE COUNTER MEDICATION Take 1 capsule by mouth 3 (three) times daily with meals. LipoSan Ultra Chitosan     Polyethyl Glycol-Propyl Glycol (SYSTANE) 0.4-0.3 % SOLN Place 1 drop into both eyes 2 (two) times daily.     polyethylene glycol (MIRALAX / GLYCOLAX) 17 g packet Take 17 g by mouth as needed.     valsartan (DIOVAN) 160 MG tablet Take 1 tablet (160 mg total) by mouth daily. 30 tablet 1   Varenicline Tartrate (TYRVAYA) 0.03 MG/ACT SOLN Place 1 spray into both nostrils daily as needed (Allergies).     warfarin (JANTOVEN) 5 MG tablet Take 0.5-1 tablets (2.5-5 mg total) by mouth See admin instructions. Take 2.5 mg on Tuesday and Thursday. Take 5 mg all the other days     zinc gluconate 50 MG tablet Take 50 mg by mouth daily.     ferrous sulfate 325 (65 FE) MG tablet Take 325 mg by mouth once a week. (Patient not taking: Reported on 01/05/2022)     fluticasone (FLONASE) 50 MCG/ACT nasal spray Place 1 spray into both nostrils daily for 3 days. (Patient taking differently: Place 1 spray into both nostrils daily as needed for allergies or rhinitis.) 16 g 0   No current facility-administered medications for this encounter.    Allergies  Allergen Reactions   Codeine     Halluciations   Sudafed [Pseudoephedrine Hcl]     Halluications   Cefuroxime    Sudafed [Pseudoephedrine] Other (See Comments)    Social History   Socioeconomic History   Marital status: Married     Spouse name: Not on file   Number of children: Not on file   Years of education: Not on file   Highest education level: Not on file  Occupational History   Not on file  Tobacco Use   Smoking status: Never   Smokeless tobacco: Never  Substance and Sexual Activity   Alcohol use: No   Drug use: No   Sexual activity: Not on file  Other Topics Concern   Not on file  Social History Narrative   Not on  file   Social Determinants of Health   Financial Resource Strain: Not on file  Food Insecurity: Not on file  Transportation Needs: Not on file  Physical Activity: Not on file  Stress: Not on file  Social Connections: Not on file  Intimate Partner Violence: Not on file    Family History  Problem Relation Age of Onset   Heart failure Sister     ROS- All systems are reviewed and negative except as per the HPI above  Physical Exam: Vitals:   01/05/22 1420  BP: (!) 132/50  Pulse: 66  Weight: 65.5 kg  Height: 5\' 1"  (1.549 m)   Wt Readings from Last 3 Encounters:  01/05/22 65.5 kg  12/26/21 67 kg  12/26/21 (P) 66.5 kg    Labs: Lab Results  Component Value Date   NA 136 12/29/2021   K 4.1 12/29/2021   CL 105 12/29/2021   CO2 24 12/29/2021   GLUCOSE 115 (H) 12/29/2021   BUN 18 12/29/2021   CREATININE 0.87 12/29/2021   CALCIUM 8.6 (L) 12/29/2021   MG 2.9 (H) 12/29/2021   Lab Results  Component Value Date   INR 1.8 (A) 01/02/2022   Lab Results  Component Value Date   TRIG 105 04/01/2020     GEN- The patient is well appearing, alert and oriented x 3 today.   Head- normocephalic, atraumatic Eyes-  Sclera clear, conjunctiva pink Ears- hearing intact Oropharynx- clear Neck- supple, no JVP Lymph- no cervical lymphadenopathy Lungs- Clear to ausculation bilaterally, normal work of breathing Heart- regular rate and rhythm, no murmurs, rubs or gallops, PMI not laterally displaced GI- soft, NT, ND, + BS Extremities- no clubbing, cyanosis, or edema MS- no  significant deformity or atrophy Skin- no rash or lesion Psych- euthymic mood, full affect Neuro- strength and sensation are intact  EKG-sinus rhythm at 66 bpm, pr int 212 ms, qrs int 84 ms, qtc 471 ms   Epic records reviewed   Assessment and Plan:  1. Persistent afib Pt stopped amiodarone for potential side effects and discussed with Dr. 04/03/2020 who suggested tikosyn, at time of initial visit with me, she did not prefer to do this then She reconsidered and is now here for Tikosyn  f/u from hospitalization last week for loading  She is staying in SR and feels much improved Precautions with tikosyn again reviewed  Continue tikosyn 250 mcg bid   2. CHA2DS2VASc  score of at least 5.  She is  on warfarin  INR's per [facility]   3, HTN Stable   4. Chronic diastolic HF Normovolemic today   F/u with Lalla Brothers, PA, 4/10  6/10. [facility] [address] [place], [place] [phone]

## 2022-01-13 ENCOUNTER — Other Ambulatory Visit (HOSPITAL_COMMUNITY): Payer: Self-pay | Admitting: *Deleted

## 2022-01-18 ENCOUNTER — Other Ambulatory Visit (HOSPITAL_COMMUNITY): Payer: Self-pay | Admitting: *Deleted

## 2022-01-18 ENCOUNTER — Other Ambulatory Visit (HOSPITAL_COMMUNITY): Payer: Self-pay

## 2022-01-18 DIAGNOSIS — E039 Hypothyroidism, unspecified: Secondary | ICD-10-CM | POA: Diagnosis not present

## 2022-01-18 MED ORDER — DOFETILIDE 250 MCG PO CAPS: 250.0000 ug | ORAL_CAPSULE | Freq: Two times a day (BID) | ORAL | 2 refills | Status: DC

## 2022-01-23 ENCOUNTER — Other Ambulatory Visit (HOSPITAL_COMMUNITY): Payer: Self-pay | Admitting: *Deleted

## 2022-01-23 MED ORDER — DILTIAZEM HCL ER 120 MG PO CP12: 120.0000 mg | ORAL_CAPSULE | Freq: Two times a day (BID) | ORAL | 3 refills | Status: DC

## 2022-01-25 ENCOUNTER — Other Ambulatory Visit: Payer: Self-pay

## 2022-01-25 DIAGNOSIS — I4819 Other persistent atrial fibrillation: Secondary | ICD-10-CM

## 2022-01-25 MED ORDER — WARFARIN SODIUM 5 MG PO TABS: ORAL_TABLET | ORAL | 1 refills | Status: DC

## 2022-01-30 ENCOUNTER — Other Ambulatory Visit: Payer: Self-pay

## 2022-01-30 ENCOUNTER — Ambulatory Visit: Payer: Medicare HMO

## 2022-01-30 DIAGNOSIS — Z5181 Encounter for therapeutic drug level monitoring: Secondary | ICD-10-CM

## 2022-01-30 DIAGNOSIS — Z952 Presence of prosthetic heart valve: Secondary | ICD-10-CM | POA: Diagnosis not present

## 2022-01-30 LAB — POCT INR: INR: 3.1 — AB (ref 2.0–3.0)

## 2022-01-30 NOTE — Patient Instructions (Signed)
HOLD TODAY ONLY and then continue taking 1/2 tablet daily except 1 tablet on Monday, Wednesday, and Friday.  INR in 2 weeks.   Coumadin Clinic [phone] ? ?

## 2022-02-07 NOTE — Progress Notes (Signed)
? ?PCP:  Merri Brunette, MD ?Primary Cardiologist: Donato Schultz, MD ?Electrophysiologist: Lanier Prude, MD  ? ?Tracy Vargas is a 88 y.o. female seen today for Lanier Prude, MD for routine electrophysiology followup.  Since last being seen in our clinic the patient reports doing very well.  she denies chest pain, palpitations, dyspnea, PND, orthopnea, nausea, vomiting, dizziness, syncope, edema, weight gain, or early satiety. ? ?Past Medical History:  ?Diagnosis Date  ? Acquired hyperlipoproteinemia   ? Chronic diastolic heart failure (HCC) 09/01/2013  ? Diastolic dysfunction   ? Dyspnea   ? HTN (hypertension)   ? Hyperlipidemia   ? Osteopenia   ? ?Past Surgical History:  ?Procedure Laterality Date  ? APPENDECTOMY    ? CARDIAC VALVE REPLACEMENT    ? CARDIOVERSION N/A 08/24/2021  ? Procedure: CARDIOVERSION;  Surgeon: Quintella Reichert, MD;  Location: [facility] ENDOSCOPY;  Service: Cardiovascular;  Laterality: N/A;  ? CATARACT EXTRACTION    ? ESOPHAGOGASTRODUODENOSCOPY N/A 07/14/2016  ? Procedure: ESOPHAGOGASTRODUODENOSCOPY (EGD);  Surgeon: Dorena Cookey, MD;  Location: [facility] ENDOSCOPY;  Service: Endoscopy;  Laterality: N/A;  ? ROTATOR CUFF REPAIR    ? TEE WITHOUT CARDIOVERSION N/A 08/24/2021  ? Procedure: TRANSESOPHAGEAL ECHOCARDIOGRAM (TEE);  Surgeon: Quintella Reichert, MD;  Location: [facility] ENDOSCOPY;  Service: Cardiovascular;  Laterality: N/A;  ? TONSILLECTOMY    ? ? ?Current Outpatient Medications  ?Medication Sig Dispense Refill  ? alum & mag hydroxide-simeth (MYLANTA MAXIMUM STRENGTH) 400-400-40 MG/5ML suspension Take 15 mLs by mouth every 6 (six) hours as needed for indigestion.    ? Alum & Mag Hydroxide-Simeth (MYLANTA PO) Powder adds to her juice as needed    ? amLODipine (NORVASC) 2.5 MG tablet Take 2.5 mg by mouth daily.    ? Ascorbic Acid (VITAMIN C) 1000 MG tablet Take 1,000 mg by mouth daily.    ? atorvastatin (LIPITOR) 10 MG tablet Take 10 mg by mouth daily at 6 PM.     ? b complex vitamins capsule Take 1 capsule by  mouth daily.    ? Biotin 5000 MCG TABS Take 5,000 mcg by mouth once a week.    ? calcium citrate-vitamin D (CITRACAL+D) 315-200 MG-UNIT tablet Take 1 tablet by mouth at bedtime.    ? Cholecalciferol (VITAMIN D) 50 MCG (2000 UT) CAPS Take 2,000 Units by mouth daily.    ? diltiazem (CARDIZEM SR) 120 MG 12 hr capsule Take 1 capsule (120 mg total) by mouth 2 (two) times daily. 180 capsule 3  ? docusate sodium (COLACE) 250 MG capsule Take 250 mg by mouth as needed for constipation.    ? dofetilide (TIKOSYN) 250 MCG capsule Take 1 capsule (250 mcg total) by mouth 2 (two) times daily. 180 capsule 2  ? ferrous sulfate 325 (65 FE) MG tablet Take 325 mg by mouth once a week. (Patient not taking: Reported on 01/05/2022)    ? fluticasone (FLONASE) 50 MCG/ACT nasal spray Place 1 spray into both nostrils daily for 3 days. (Patient taking differently: Place 1 spray into both nostrils daily as needed for allergies or rhinitis.) 16 g 0  ? furosemide (LASIX) 20 MG tablet Take 1 tablet (20 mg total) by mouth daily as needed for edema (Takes when not leaving the house).    ? levothyroxine (SYNTHROID) 50 MCG tablet Take 50 mcg by mouth daily.    ? LORazepam (ATIVAN) 0.5 MG tablet Take 0.25 mg by mouth every 8 (eight) hours as needed.    ? Magnesium 400 MG CAPS  Take 200 mg by mouth every evening. Triple complex    ? omega-3 acid ethyl esters (LOVAZA) 1 g capsule Take 1 g by mouth daily.    ? OVER THE COUNTER MEDICATION Take 1 capsule by mouth 3 (three) times daily with meals. LipoSan Ultra Chitosan    ? Polyethyl Glycol-Propyl Glycol (SYSTANE) 0.4-0.3 % SOLN Place 1 drop into both eyes 2 (two) times daily.    ? polyethylene glycol (MIRALAX / GLYCOLAX) 17 g packet Take 17 g by mouth as needed.    ? valsartan (DIOVAN) 160 MG tablet Take 1 tablet (160 mg total) by mouth daily. 30 tablet 1  ? Varenicline Tartrate (TYRVAYA) 0.03 MG/ACT SOLN Place 1 spray into both nostrils daily as needed (Allergies).    ? warfarin (JANTOVEN) 5 MG tablet Take  2.5 mg by mouth daily except 5mg  on Monday, Wednesday, and Friday or as directed by Anticoagulation Clinic. 70 tablet 1  ? zinc gluconate 50 MG tablet Take 50 mg by mouth daily.    ? ?No current facility-administered medications for this visit.  ? ? ?Allergies  ?Allergen Reactions  ? Codeine   ?  Halluciations  ? Sudafed [Pseudoephedrine Hcl]   ?  Halluications  ? Cefuroxime   ? Sudafed [Pseudoephedrine] Other (See Comments)  ? ? ?Social History  ? ?Socioeconomic History  ? Marital status: Married  ?  Spouse name: Not on file  ? Number of children: Not on file  ? Years of education: Not on file  ? Highest education level: Not on file  ?Occupational History  ? Not on file  ?Tobacco Use  ? Smoking status: Never  ? Smokeless tobacco: Never  ?Substance and Sexual Activity  ? Alcohol use: No  ? Drug use: No  ? Sexual activity: Not on file  ?Other Topics Concern  ? Not on file  ?Social History Narrative  ? Not on file  ? ?Social Determinants of Health  ? ?Financial Resource Strain: Not on file  ?Food Insecurity: Not on file  ?Transportation Needs: Not on file  ?Physical Activity: Not on file  ?Stress: Not on file  ?Social Connections: Not on file  ?Intimate Partner Violence: Not on file  ? ? ? ?Review of Systems: ?All other systems reviewed and are otherwise negative except as noted above. ? ?Physical Exam: ?There were no vitals filed for this visit. ? ?GEN- The patient is well appearing, alert and oriented x 3 today.   ?HEENT: normocephalic, atraumatic; sclera clear, conjunctiva pink; hearing intact; oropharynx clear; neck supple, no JVP ?Lymph- no cervical lymphadenopathy ?Lungs- Clear to ausculation bilaterally, normal work of breathing.  No wheezes, rales, rhonchi ?Heart- Regular rate and rhythm, no murmurs, rubs or gallops, PMI not laterally displaced ?GI- soft, non-tender, non-distended, bowel sounds present, no hepatosplenomegaly ?Extremities- no clubbing, cyanosis, or edema; DP/PT/radial pulses 2+ bilaterally ?MS-  no significant deformity or atrophy ?Skin- warm and dry, no rash or lesion ?Psych- euthymic mood, full affect ?Neuro- strength and sensation are intact ? ?EKG is ordered. Personal review of EKG from today shows NSR at 65 bpm ? ?Additional studies reviewed include: ?Previous EP office notes.  ? ?Assessment and Plan: ? ?1. Persistent atrial fibrillation ?EKG today shows NSR at 65 bpm with stable QTc ?Continue tikosyn ?Labs today ? ?2. HTN ?Stable on current regimen  ? ?3. Chronic diastolic CHF ?Volume status stable.  ? ?Follow up with Dr. Saturday in 3 months  ? ?Lalla Brothers, PA-C  ?02/07/22 ?9:16 AM ? ?

## 2022-02-13 ENCOUNTER — Ambulatory Visit: Payer: Medicare HMO

## 2022-02-13 ENCOUNTER — Ambulatory Visit: Payer: Medicare HMO | Admitting: Student

## 2022-02-13 ENCOUNTER — Encounter: Payer: Self-pay | Admitting: Student

## 2022-02-13 VITALS — BP 136/62 | HR 65 | Ht 61.0 in | Wt 146.4 lb

## 2022-02-13 DIAGNOSIS — Z952 Presence of prosthetic heart valve: Secondary | ICD-10-CM | POA: Diagnosis not present

## 2022-02-13 DIAGNOSIS — Z5181 Encounter for therapeutic drug level monitoring: Secondary | ICD-10-CM | POA: Diagnosis not present

## 2022-02-13 DIAGNOSIS — I5032 Chronic diastolic (congestive) heart failure: Secondary | ICD-10-CM | POA: Diagnosis not present

## 2022-02-13 DIAGNOSIS — I10 Essential (primary) hypertension: Secondary | ICD-10-CM

## 2022-02-13 DIAGNOSIS — I4819 Other persistent atrial fibrillation: Secondary | ICD-10-CM

## 2022-02-13 LAB — POCT INR: INR: 3.1 — AB (ref 2.0–3.0)

## 2022-02-13 NOTE — Patient Instructions (Signed)
Medication Instructions:  Your physician recommends that you continue on your current medications as directed. Please refer to the Current Medication list given to you today.  *If you need a refill on your cardiac medications before your next appointment, please call your pharmacy*   Lab Work: TODAY: BMET, Mag  If you have labs (blood work) drawn today and your tests are completely normal, you will receive your results only by: [place] (if you have MyChart) OR A paper copy in the mail If you have any lab test that is abnormal or we need to change your treatment, we will call you to review the results.   Follow-Up: At [facility], you and your health needs are our priority.  As part of our continuing mission to provide you with exceptional heart care, we have created designated Provider Care Teams.  These Care Teams include your primary Cardiologist (physician) and Advanced Practice Providers (APPs -  Physician Assistants and Nurse Practitioners) who all work together to provide you with the care you need, when you need it.  We recommend signing up for the patient portal called "MyChart".  Sign up information is provided on this After Visit Summary.  MyChart is used to connect with patients for Virtual Visits (Telemedicine).  Patients are able to view lab/test results, encounter notes, upcoming appointments, etc.  Non-urgent messages can be sent to your provider as well.   To learn more about what you can do with MyChart, go to [email or web address].    Your next appointment:   3 month(s)  The format for your next appointment:   In Person  Provider:   Lars Mage, MD   Important Information About Sugar

## 2022-02-13 NOTE — Patient Instructions (Signed)
HOLD TODAY ONLY and then continue taking 1/2 tablet daily except 1 tablet on Monday, Wednesday, and Friday.  INR in 2 weeks.   Coumadin Clinic [phone] ? ?

## 2022-02-14 LAB — BASIC METABOLIC PANEL
BUN/Creatinine Ratio: 28 (ref 12–28)
BUN: 21 mg/dL (ref 8–27)
CO2: 21 mmol/L (ref 20–29)
Calcium: 8.9 mg/dL (ref 8.7–10.3)
Chloride: 107 mmol/L — ABNORMAL HIGH (ref 96–106)
Creatinine, Ser: 0.74 mg/dL (ref 0.57–1.00)
Glucose: 90 mg/dL (ref 70–99)
Potassium: 4.9 mmol/L (ref 3.5–5.2)
Sodium: 142 mmol/L (ref 134–144)
eGFR: 79 mL/min/{1.73_m2} (ref >59)

## 2022-02-14 LAB — MAGNESIUM: Magnesium: 2.6 mg/dL — ABNORMAL HIGH (ref 1.6–2.3)

## 2022-02-15 DIAGNOSIS — I110 Hypertensive heart disease with heart failure: Secondary | ICD-10-CM | POA: Diagnosis not present

## 2022-02-15 DIAGNOSIS — I4891 Unspecified atrial fibrillation: Secondary | ICD-10-CM | POA: Diagnosis not present

## 2022-02-15 DIAGNOSIS — Z008 Encounter for other general examination: Secondary | ICD-10-CM | POA: Diagnosis not present

## 2022-02-15 DIAGNOSIS — R32 Unspecified urinary incontinence: Secondary | ICD-10-CM | POA: Diagnosis not present

## 2022-02-15 DIAGNOSIS — I509 Heart failure, unspecified: Secondary | ICD-10-CM | POA: Diagnosis not present

## 2022-02-15 DIAGNOSIS — E039 Hypothyroidism, unspecified: Secondary | ICD-10-CM | POA: Diagnosis not present

## 2022-02-15 DIAGNOSIS — Z886 Allergy status to analgesic agent status: Secondary | ICD-10-CM | POA: Diagnosis not present

## 2022-02-15 DIAGNOSIS — I471 Supraventricular tachycardia: Secondary | ICD-10-CM | POA: Diagnosis not present

## 2022-02-15 DIAGNOSIS — Z7901 Long term (current) use of anticoagulants: Secondary | ICD-10-CM | POA: Diagnosis not present

## 2022-02-15 DIAGNOSIS — E785 Hyperlipidemia, unspecified: Secondary | ICD-10-CM | POA: Diagnosis not present

## 2022-03-02 ENCOUNTER — Ambulatory Visit: Payer: Medicare HMO

## 2022-03-02 DIAGNOSIS — Z952 Presence of prosthetic heart valve: Secondary | ICD-10-CM | POA: Diagnosis not present

## 2022-03-02 DIAGNOSIS — Z5181 Encounter for therapeutic drug level monitoring: Secondary | ICD-10-CM | POA: Diagnosis not present

## 2022-03-02 LAB — POCT INR: INR: 3.3 — AB (ref 2.0–3.0)

## 2022-03-02 NOTE — Patient Instructions (Signed)
HOLD FRIDAY ONLY and then DECREASE TO 1/2 tablet daily except 1 tablet on Monday and Friday.  INR in 4 weeks.   Coumadin Clinic [phone] ?

## 2022-03-31 ENCOUNTER — Ambulatory Visit: Payer: Medicare HMO

## 2022-03-31 DIAGNOSIS — Z5181 Encounter for therapeutic drug level monitoring: Secondary | ICD-10-CM | POA: Diagnosis not present

## 2022-03-31 DIAGNOSIS — Z952 Presence of prosthetic heart valve: Secondary | ICD-10-CM | POA: Diagnosis not present

## 2022-03-31 LAB — POCT INR: INR: 2.6 (ref 2.0–3.0)

## 2022-03-31 NOTE — Patient Instructions (Signed)
Continue 1/2 tablet daily except 1 tablet on Monday and Friday.  INR in 4 weeks.   Coumadin Clinic [phone];  Eat greens tonight.

## 2022-04-13 DIAGNOSIS — I4891 Unspecified atrial fibrillation: Secondary | ICD-10-CM | POA: Diagnosis not present

## 2022-04-13 DIAGNOSIS — E7800 Pure hypercholesterolemia, unspecified: Secondary | ICD-10-CM | POA: Diagnosis not present

## 2022-04-13 DIAGNOSIS — I5032 Chronic diastolic (congestive) heart failure: Secondary | ICD-10-CM | POA: Diagnosis not present

## 2022-04-13 DIAGNOSIS — I700 Atherosclerosis of aorta: Secondary | ICD-10-CM | POA: Diagnosis not present

## 2022-04-13 DIAGNOSIS — Z7901 Long term (current) use of anticoagulants: Secondary | ICD-10-CM | POA: Diagnosis not present

## 2022-04-13 DIAGNOSIS — Z952 Presence of prosthetic heart valve: Secondary | ICD-10-CM | POA: Diagnosis not present

## 2022-04-13 DIAGNOSIS — I10 Essential (primary) hypertension: Secondary | ICD-10-CM | POA: Diagnosis not present

## 2022-04-13 DIAGNOSIS — E039 Hypothyroidism, unspecified: Secondary | ICD-10-CM | POA: Diagnosis not present

## 2022-04-13 DIAGNOSIS — K5900 Constipation, unspecified: Secondary | ICD-10-CM | POA: Diagnosis not present

## 2022-04-21 ENCOUNTER — Telehealth: Payer: Self-pay | Admitting: Cardiology

## 2022-04-21 NOTE — Telephone Encounter (Signed)
If Home Health RN is calling please get Coumadin Nurse on the phone STAT  1.  Are you calling in regards to an appointment? Yes  2.  Are you calling for a refill ? No  3.  Are you having bleeding issues? No  4.  Do you need clearance to hold Coumadin? No  Patient needs to reschedule appt on 6/21 preferably between 9:45-10am.  She has home health care for her husband and can only come at that time on Mondays, Wednesdays or Thursdays.  Coumadin clinic did not have a close appt to fulfill her request.  Please advise.   Please route to the Coumadin Clinic Pool

## 2022-04-21 NOTE — Telephone Encounter (Signed)
Called and spoke to pt. Due to limited times she can have INR checked coumadin appointment rescheduled for 05/04/2022.

## 2022-04-24 ENCOUNTER — Other Ambulatory Visit (HOSPITAL_COMMUNITY): Payer: Self-pay | Admitting: *Deleted

## 2022-04-24 MED ORDER — DOFETILIDE 250 MCG PO CAPS: 250.0000 ug | ORAL_CAPSULE | Freq: Two times a day (BID) | ORAL | 2 refills | Status: DC

## 2022-04-24 MED ORDER — DILTIAZEM HCL ER 120 MG PO CP12: 120.0000 mg | ORAL_CAPSULE | Freq: Two times a day (BID) | ORAL | 3 refills | Status: DC

## 2022-05-04 ENCOUNTER — Ambulatory Visit: Payer: Medicare HMO

## 2022-05-04 DIAGNOSIS — Z5181 Encounter for therapeutic drug level monitoring: Secondary | ICD-10-CM | POA: Diagnosis not present

## 2022-05-04 DIAGNOSIS — Z952 Presence of prosthetic heart valve: Secondary | ICD-10-CM | POA: Diagnosis not present

## 2022-05-04 LAB — POCT INR: INR: 1.6 — AB (ref 2.0–3.0)

## 2022-05-04 NOTE — Patient Instructions (Addendum)
Description   Take 1 tablet today and then continue 1/2 tablet daily except 1 tablet on Monday and Friday.   Recheck INR in 3 weeks.  Coumadin Clinic [phone]

## 2022-05-22 ENCOUNTER — Ambulatory Visit: Payer: Medicare HMO | Admitting: Cardiology

## 2022-06-05 DIAGNOSIS — H18593 Other hereditary corneal dystrophies, bilateral: Secondary | ICD-10-CM | POA: Diagnosis not present

## 2022-06-05 DIAGNOSIS — H353130 Nonexudative age-related macular degeneration, bilateral, stage unspecified: Secondary | ICD-10-CM | POA: Diagnosis not present

## 2022-06-05 DIAGNOSIS — H348322 Tributary (branch) retinal vein occlusion, left eye, stable: Secondary | ICD-10-CM | POA: Diagnosis not present

## 2022-06-08 ENCOUNTER — Ambulatory Visit (INDEPENDENT_AMBULATORY_CARE_PROVIDER_SITE_OTHER): Payer: Medicare HMO

## 2022-06-08 DIAGNOSIS — Z5181 Encounter for therapeutic drug level monitoring: Secondary | ICD-10-CM | POA: Diagnosis not present

## 2022-06-08 DIAGNOSIS — Z952 Presence of prosthetic heart valve: Secondary | ICD-10-CM

## 2022-06-08 LAB — POCT INR: INR: 1.2 — AB (ref 2.0–3.0)

## 2022-06-08 NOTE — Patient Instructions (Signed)
Take 1 tablet today and 1.5 FRIDAY and then continue 1/2 tablet daily except 1 tablet on Monday and Friday.   Recheck INR in 3 weeks (per pt request) .  Coumadin Clinic [phone]

## 2022-06-30 ENCOUNTER — Ambulatory Visit: Payer: Medicare HMO | Admitting: *Deleted

## 2022-06-30 DIAGNOSIS — Z5181 Encounter for therapeutic drug level monitoring: Secondary | ICD-10-CM | POA: Diagnosis not present

## 2022-06-30 DIAGNOSIS — Z952 Presence of prosthetic heart valve: Secondary | ICD-10-CM | POA: Diagnosis not present

## 2022-06-30 LAB — POCT INR: INR: 1.2 — AB (ref 2.0–3.0)

## 2022-06-30 NOTE — Patient Instructions (Signed)
Description   Take 1.5 tablets today and 1.5 tablets on tomorrow then start taking 1/2 tablet daily except 1 tablet on Monday, Wednesday, and Friday. Recheck INR in 1 week.  Coumadin Clinic [phone]

## 2022-07-07 ENCOUNTER — Ambulatory Visit: Payer: Medicare HMO | Attending: Cardiology

## 2022-07-07 DIAGNOSIS — Z952 Presence of prosthetic heart valve: Secondary | ICD-10-CM | POA: Diagnosis not present

## 2022-07-07 DIAGNOSIS — Z5181 Encounter for therapeutic drug level monitoring: Secondary | ICD-10-CM

## 2022-07-07 LAB — POCT INR: INR: 2.3 (ref 2.0–3.0)

## 2022-07-07 NOTE — Patient Instructions (Signed)
Continue 1/2 tablet daily except 1 tablet on Monday, Wednesday, and Friday. Recheck INR in 3 weeks.  Coumadin Clinic [phone]

## 2022-07-10 NOTE — Progress Notes (Deleted)
Electrophysiology Office Follow up Visit Note:    Date:  07/10/2022   ID:  Tracy Vargas, DOB 04-05-1934, MRN [id number]  PCP:  Merri Brunette, MD  [facility] Cardiologist:  Donato Schultz, MD  [facility] Electrophysiologist:  Lanier Prude, MD    Interval History:    Tracy Vargas is a 88 y.o. female who presents for a follow up visit. They were last seen in clinic 02/13/2022 for AF follow up. She is maintained on dofetilide for her AF.  I previously saw the patient 09/27/2021 before she was loaded on dofetilide.      Past Medical History:  Diagnosis Date   Acquired hyperlipoproteinemia    Chronic diastolic heart failure (HCC) 09/01/2013   Diastolic dysfunction    Dyspnea    HTN (hypertension)    Hyperlipidemia    Osteopenia     Past Surgical History:  Procedure Laterality Date   APPENDECTOMY     CARDIAC VALVE REPLACEMENT     CARDIOVERSION N/A 08/24/2021   Procedure: CARDIOVERSION;  Surgeon: Quintella Reichert, MD;  Location: [facility] ENDOSCOPY;  Service: Cardiovascular;  Laterality: N/A;   CATARACT EXTRACTION     ESOPHAGOGASTRODUODENOSCOPY N/A 07/14/2016   Procedure: ESOPHAGOGASTRODUODENOSCOPY (EGD);  Surgeon: Dorena Cookey, MD;  Location: [facility] ENDOSCOPY;  Service: Endoscopy;  Laterality: N/A;   ROTATOR CUFF REPAIR     TEE WITHOUT CARDIOVERSION N/A 08/24/2021   Procedure: TRANSESOPHAGEAL ECHOCARDIOGRAM (TEE);  Surgeon: Quintella Reichert, MD;  Location: [facility] ENDOSCOPY;  Service: Cardiovascular;  Laterality: N/A;   TONSILLECTOMY      Current Medications: No outpatient medications have been marked as taking for the 07/11/22 encounter (Appointment) with Lanier Prude, MD.     Allergies:   Codeine, Sudafed [pseudoephedrine hcl], Cefuroxime, and Sudafed [pseudoephedrine]   Social History   Socioeconomic History   Marital status: Married    Spouse name: Not on file   Number of children: Not on file   Years of education: Not on file   Highest education level: Not on file   Occupational History   Not on file  Tobacco Use   Smoking status: Never   Smokeless tobacco: Never  Substance and Sexual Activity   Alcohol use: No   Drug use: No   Sexual activity: Not on file  Other Topics Concern   Not on file  Social History Narrative   Not on file   Social Determinants of Health   Financial Resource Strain: Not on file  Food Insecurity: Not on file  Transportation Needs: Not on file  Physical Activity: Not on file  Stress: Not on file  Social Connections: Not on file     Family History: The patient's family history includes Heart failure in her sister.  ROS:   Please see the history of present illness.    All other systems reviewed and are negative.  EKGs/Labs/Other Studies Reviewed:    The following studies were reviewed today:  02/13/2022 Creatinine 0.74   EKG:  The ekg ordered today demonstrates ***  Recent Labs: 08/16/2021: Hemoglobin 12.6 09/01/2021: ALT 32; TSH 3.520 02/13/2022: BUN 21; Creatinine, Ser 0.74; Magnesium 2.6; Potassium 4.9; Sodium 142  Recent Lipid Panel    Component Value Date/Time   TRIG 105 04/01/2020 1431    Physical Exam:    VS:  There were no vitals taken for this visit.    Wt Readings from Last 3 Encounters:  02/13/22 146 lb 6.4 oz (66.4 kg)  01/05/22 144 lb 6.4 oz (65.5 kg)  12/26/21 147 lb 11.3 oz (67 kg)     GEN: *** Well nourished, well developed in no acute distress HEENT: Normal NECK: No JVD; No carotid bruits LYMPHATICS: No lymphadenopathy CARDIAC: ***RRR, no murmurs, rubs, gallops RESPIRATORY:  Clear to auscultation without rales, wheezing or rhonchi  ABDOMEN: Soft, non-tender, non-distended MUSCULOSKELETAL:  No edema; No deformity  SKIN: Warm and dry NEUROLOGIC:  Alert and oriented x 3 PSYCHIATRIC:  Normal affect        ASSESSMENT:    1. Chronic a-fib (HCC)   2. Chronic diastolic heart failure (HCC)   3. Moderate mitral regurgitation   4. Primary hypertension   5. Encounter for  long-term (current) use of high-risk medication    PLAN:    In order of problems listed above:   #Persistent AF #Dofetilide monitoring  Doing well on dofetilide, maintaining sinus rhythm. Last Cr in April 2023, <1. Need repeat BMP and Mg today. QTc ***ms today.  Follow up with AF clinic in 3 months.  #Hypertension *** goal today.  Recommend checking blood pressures 1-2 times per week at home and recording the values.  Recommend bringing these recordings to the primary care physician.  #Chronic diastolic heart failure #Moderate MR  Update NT-ProBNP today.  Discussed ALLEVIATE HF trial today.    Total time spent with patient today *** minutes. This includes reviewing records, evaluating the patient and coordinating care.   Medication Adjustments/Labs and Tests Ordered: Current medicines are reviewed at length with the patient today.  Concerns regarding medicines are outlined above.  No orders of the defined types were placed in this encounter.  No orders of the defined types were placed in this encounter.    Signed, Steffanie Dunn, MD, [facility], [facility] 07/10/2022 3:54 PM    Electrophysiology [facility]

## 2022-07-11 ENCOUNTER — Ambulatory Visit: Payer: Medicare HMO | Admitting: Cardiology

## 2022-07-17 ENCOUNTER — Encounter: Payer: Self-pay | Admitting: Cardiology

## 2022-07-17 ENCOUNTER — Ambulatory Visit: Payer: Medicare HMO | Attending: Cardiology | Admitting: Cardiology

## 2022-07-17 VITALS — BP 120/74 | HR 64 | Ht 61.0 in | Wt 148.2 lb

## 2022-07-17 DIAGNOSIS — Z79899 Other long term (current) drug therapy: Secondary | ICD-10-CM | POA: Diagnosis not present

## 2022-07-17 DIAGNOSIS — I4819 Other persistent atrial fibrillation: Secondary | ICD-10-CM

## 2022-07-17 DIAGNOSIS — I4891 Unspecified atrial fibrillation: Secondary | ICD-10-CM | POA: Diagnosis not present

## 2022-07-17 NOTE — Patient Instructions (Signed)
Medication Instructions:  none *If you need a refill on your cardiac medications before your next appointment, please call your pharmacy*   Lab Work: BMP, MAG If you have labs (blood work) drawn today and your tests are completely normal, you will receive your results only by: MyChart Message (if you have MyChart) OR A paper copy in the mail If you have any lab test that is abnormal or we need to change your treatment, we will call you to review the results.   Testing/Procedures: none   Follow-Up: At [facility], you and your health needs are our priority.  As part of our continuing mission to provide you with exceptional heart care, we have created designated Provider Care Teams.  These Care Teams include your primary Cardiologist (physician) and Advanced Practice Providers (APPs -  Physician Assistants and Nurse Practitioners) who all work together to provide you with the care you need, when you need it.  We recommend signing up for the patient portal called "MyChart".  Sign up information is provided on this After Visit Summary.  MyChart is used to connect with patients for Virtual Visits (Telemedicine).  Patients are able to view lab/test results, encounter notes, upcoming appointments, etc.  Non-urgent messages can be sent to your provider as well.   To learn more about what you can do with MyChart, go to [email or web address].    Your next appointment:   3 month(s)  The format for your next appointment:   In Person  Provider:   You will see one of the following Advanced Practice Providers on your designated Care Team:    Casimiro Needle "Mardelle Matte" Lanna Poche, [place]       Important Information About Sugar

## 2022-07-17 NOTE — Progress Notes (Signed)
Electrophysiology Office Follow up Visit Note:    Date:  07/17/2022   ID:  Fanny Bien, DOB [birth year], MRN [id number]  PCP:  Carol Ada, MD  [facility] Cardiologist:  Candee Furbish, MD  [facility] Electrophysiologist:  Vickie Epley, MD    Interval History:    DELYNN KULKA is a 88 y.o. female who presents for a follow up visit. They were last seen in clinic 02/13/2022 by Barrington Ellison, PA-C where she was feeling well. Her EKG at that visit showed NSR at 65 bpm with stable QTc. Continued on Tikosyn and warfarin.  Today, she is accompanied by a family member. She reports feeling well at this time with no known arrhythmic episodes recently.  A couple times at night she is sometimes aware of palpitations, especially after waking up multiple times in the night. 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  Every night she notices bilateral ankle swelling. By the next morning her swelling has resolved.  Reportedly her INR levels are stable.  She denies any chest pain, shortness of breath, lightheadedness, headaches, syncope, orthopnea, or PND.      Past Medical History:  Diagnosis Date   Acquired hyperlipoproteinemia    Chronic diastolic heart failure ([place]) [id number]   Diastolic dysfunction    Dyspnea    HTN (hypertension)    Hyperlipidemia    Osteopenia     Past Surgical History:  Procedure Laterality Date   APPENDECTOMY     CARDIAC VALVE REPLACEMENT     CARDIOVERSION N/A 08/24/2021   Procedure: CARDIOVERSION;  Surgeon: Sueanne Margarita, MD;  Location: [place] ENDOSCOPY;  Service: Cardiovascular;  Laterality: N/A;   CATARACT EXTRACTION     ESOPHAGOGASTRODUODENOSCOPY N/A 07/14/2016   Procedure: ESOPHAGOGASTRODUODENOSCOPY (EGD);  Surgeon: Teena Irani, MD;  Location: [facility] ENDOSCOPY;  Service: Endoscopy;  Laterality: N/A;   ROTATOR CUFF REPAIR     TEE WITHOUT CARDIOVERSION N/A 08/24/2021   Procedure: TRANSESOPHAGEAL ECHOCARDIOGRAM (TEE);  Surgeon:  Sueanne Margarita, MD;  Location: [facility] ENDOSCOPY;  Service: Cardiovascular;  Laterality: N/A;   TONSILLECTOMY      Current Medications: Current Meds  Medication Sig   alum & mag hydroxide-simeth (MYLANTA MAXIMUM STRENGTH) 400-400-40 MG/5ML suspension Take 15 mLs by mouth every 6 (six) hours as needed for indigestion.   Alum & Mag Hydroxide-Simeth (MYLANTA PO) Powder adds to her juice as needed   amLODipine (NORVASC) 2.5 MG tablet Take 2.5 mg by mouth daily.   Ascorbic Acid (VITAMIN C) 1000 MG tablet Take 1,000 mg by mouth daily.   atorvastatin (LIPITOR) 10 MG tablet Take 10 mg by mouth daily at 6 PM.    b complex vitamins capsule Take 1 capsule by mouth daily.   Biotin 5000 MCG TABS Take 5,000 mcg by mouth once a week.   calcium citrate-vitamin D (CITRACAL+D) 315-200 MG-UNIT tablet Take 1 tablet by mouth at bedtime.   diltiazem (CARDIZEM SR) 120 MG 12 hr capsule Take 1 capsule (120 mg total) by mouth 2 (two) times daily.   docusate sodium (COLACE) 250 MG capsule Take 250 mg by mouth as needed for constipation.   dofetilide (TIKOSYN) 250 MCG capsule Take 1 capsule (250 mcg total) by mouth 2 (two) times daily.   ferrous sulfate 325 (65 FE) MG tablet Take 325 mg by mouth once a week.   furosemide (LASIX) 20 MG tablet Take 1 tablet (20 mg total) by mouth daily as needed for edema (Takes when not leaving the house).  levothyroxine (SYNTHROID) 50 MCG tablet Take 50 mcg by mouth daily.   Magnesium 400 MG CAPS Take 400 mg by mouth every other day. Triple complex   omega-3 acid ethyl esters (LOVAZA) 1 g capsule Take 1 g by mouth daily.   OVER THE COUNTER MEDICATION Take 1 capsule by mouth 3 (three) times daily with meals. LipoSan Ultra Chitosan   Polyethyl Glycol-Propyl Glycol (SYSTANE) 0.4-0.3 % SOLN Place 1 drop into both eyes 2 (two) times daily.   polyethylene glycol (MIRALAX / GLYCOLAX) 17 g packet Take 17 g by mouth as needed.   valsartan (DIOVAN) 160 MG tablet Take 1 tablet (160 mg total) by  mouth daily.   warfarin (JANTOVEN) 5 MG tablet Take 2.5 mg by mouth daily except 5mg  on Monday, Wednesday, and Friday or as directed by Anticoagulation Clinic.   zinc gluconate 50 MG tablet Take 50 mg by mouth daily.     Allergies:   Codeine, Sudafed [pseudoephedrine hcl], Cefuroxime, and Sudafed [pseudoephedrine]   Social History   Socioeconomic History   Marital status: Married    Spouse name: Not on file   Number of children: Not on file   Years of education: Not on file   Highest education level: Not on file  Occupational History   Not on file  Tobacco Use   Smoking status: Never   Smokeless tobacco: Never  Substance and Sexual Activity   Alcohol use: No   Drug use: No   Sexual activity: Not on file  Other Topics Concern   Not on file  Social History Narrative   Not on file   Social Determinants of Health   Financial Resource Strain: Not on file  Food Insecurity: Not on file  Transportation Needs: Not on file  Physical Activity: Not on file  Stress: Not on file  Social Connections: Not on file     Family History: The patient's family history includes Heart failure in her sister.  ROS:   Please see the history of present illness.    (+) Palpitations (+) Bilateral ankle swelling All other systems reviewed and are negative.  EKGs/Labs/Other Studies Reviewed:    The following studies were reviewed today:  11/2021  Monitor: HR 60-176 bpm, average 100bpm Rare ventricular ectopy. 100% burden of atrial fibrillation and flutter.  08/24/2021  TEE:  1. Left ventricular ejection fraction, by estimation, is 60 to 65%. The  left ventricle has normal function. The left ventricle has no regional  wall motion abnormalities.   2. Right ventricular systolic function is normal. The right ventricular  size is normal.   3. Left atrial size was moderately dilated. No left atrial/left atrial  appendage thrombus was detected. The LAA emptying velocity was 20 cm/s.   4.  Right atrial size was mildly dilated.   5. The mitral valve is normal in structure. Moderate mitral valve  regurgitation with 2 prominent jets. No evidence of mitral stenosis.  Moderate mitral annular calcification.   6. Tricuspid valve regurgitation is moderate.   7. The aortic valve has been repaired/replaced. Aortic valve  regurgitation is not visualized. No aortic stenosis is present. There is a  mechanical valve present in the aortic position.   8. There is mild (Grade II) layered plaque involving the ascending aorta,  aortic root and descending aorta.   9. The inferior vena cava is normal in size with greater than 50%  respiratory variability, suggesting right atrial pressure of 3 mmHg.   Conclusion(s)/Recommendation(s): Normal biventricular function moderate  mitral regurgitation with 2 prominent jets. No LA or LAA thrombus. No  LA/LAA thrombus identified. Successful cardioversion performed with  restoration of normal sinus rhythm.    EKG:  EKG is personally reviewed.  07/17/2022: Sinus rhythm.  QTc is 480 ms.  Recent Labs: 08/16/2021: Hemoglobin 12.6 09/01/2021: ALT 32; TSH 3.520 02/13/2022: BUN 21; Creatinine, Ser 0.74; Magnesium 2.6; Potassium 4.9; Sodium 142   Recent Lipid Panel    Component Value Date/Time   TRIG 105 04/01/2020 1431    Physical Exam:    VS:  BP 120/74   Pulse 64   Ht 5\' 1"  (1.549 m)   Wt 148 lb 3.2 oz (67.2 kg)   SpO2 97%   BMI 28.00 kg/m     Wt Readings from Last 3 Encounters:  07/17/22 148 lb 3.2 oz (67.2 kg)  02/13/22 146 lb 6.4 oz (66.4 kg)  01/05/22 144 lb 6.4 oz (65.5 kg)     GEN: Well nourished, well developed in no acute distress HEENT: Normal NECK: No JVD; No carotid bruits LYMPHATICS: No lymphadenopathy CARDIAC: RRR, no murmurs, rubs, gallops.  Mechanical heart valve sounds present. RESPIRATORY:  Clear to auscultation without rales, wheezing or rhonchi  ABDOMEN: Soft, non-tender, non-distended MUSCULOSKELETAL:  No edema; No  deformity  SKIN: Warm and dry NEUROLOGIC:  Alert and oriented x 3 PSYCHIATRIC:  Normal affect        ASSESSMENT:    1. Persistent atrial fibrillation (HCC)   2. Encounter for long-term (current) use of high-risk medication    PLAN:    In order of problems listed above:  #Persistent atrial fibrillation Doing well on Tikosyn 250 mcg by mouth twice daily.  She also takes Cardizem.  On warfarin for stroke prophylaxis given the presence of a mechanical heart valve.  We will repeat her BMP and magnesium today.  QTc on today's EKG is acceptable for continued Tikosyn use.  #Mechanical heart valve Prosthesis functioning appropriately.  Continue Coumadin for stroke prophylaxis. Follow-up in 3 months with 03/07/22, PA-C.     Medication Adjustments/Labs and Tests Ordered: Current medicines are reviewed at length with the patient today.  Concerns regarding medicines are outlined above.   Orders Placed This Encounter  Procedures   Basic Metabolic Panel (BMET)   Magnesium   EKG 12-Lead   No orders of the defined types were placed in this encounter.   I,Mathew Stumpf,acting as a Otilio Saber for Neurosurgeon, MD.,have documented all relevant documentation on the behalf of Lanier Prude, MD,as directed by  Lanier Prude, MD while in the presence of Lanier Prude, MD.  I, Lanier Prude, MD, have reviewed all documentation for this visit. The documentation on 07/17/22 for the exam, diagnosis, procedures, and orders are all accurate and complete.   Signed, 09/16/22, MD, [facility], [facility] 07/17/2022 5:09 PM    Electrophysiology [facility]

## 2022-07-18 LAB — BASIC METABOLIC PANEL
BUN/Creatinine Ratio: 20 (ref 12–28)
BUN: 17 mg/dL (ref 8–27)
CO2: 21 mmol/L (ref 20–29)
Calcium: 8.9 mg/dL (ref 8.7–10.3)
Chloride: 106 mmol/L (ref 96–106)
Creatinine, Ser: 0.84 mg/dL (ref 0.57–1.00)
Glucose: 90 mg/dL (ref 70–99)
Potassium: 4.4 mmol/L (ref 3.5–5.2)
Sodium: 143 mmol/L (ref 134–144)
eGFR: 68 mL/min/{1.73_m2} (ref >59)

## 2022-07-18 LAB — MAGNESIUM: Magnesium: 2.3 mg/dL (ref 1.6–2.3)

## 2022-07-28 ENCOUNTER — Ambulatory Visit: Payer: Medicare HMO | Attending: Cardiology | Admitting: *Deleted

## 2022-07-28 DIAGNOSIS — Z952 Presence of prosthetic heart valve: Secondary | ICD-10-CM

## 2022-07-28 DIAGNOSIS — Z5181 Encounter for therapeutic drug level monitoring: Secondary | ICD-10-CM

## 2022-07-28 LAB — POCT INR: INR: 1.3 — AB (ref 2.0–3.0)

## 2022-07-28 NOTE — Patient Instructions (Signed)
Description   Take 1.5 tablets today of warfarin then START taking warfarin 1 tablet daily except for 1/2 a tablet on Sunday, Tuesday and Thursday. Recheck INR in 2 weeks. Coumadin Clinic [phone]

## 2022-08-02 DIAGNOSIS — I5032 Chronic diastolic (congestive) heart failure: Secondary | ICD-10-CM | POA: Diagnosis not present

## 2022-08-02 DIAGNOSIS — I10 Essential (primary) hypertension: Secondary | ICD-10-CM | POA: Diagnosis not present

## 2022-08-02 DIAGNOSIS — E039 Hypothyroidism, unspecified: Secondary | ICD-10-CM | POA: Diagnosis not present

## 2022-08-02 DIAGNOSIS — E7800 Pure hypercholesterolemia, unspecified: Secondary | ICD-10-CM | POA: Diagnosis not present

## 2022-08-09 ENCOUNTER — Ambulatory Visit: Payer: Medicare HMO | Attending: Cardiology

## 2022-08-09 DIAGNOSIS — Z952 Presence of prosthetic heart valve: Secondary | ICD-10-CM

## 2022-08-09 DIAGNOSIS — Z5181 Encounter for therapeutic drug level monitoring: Secondary | ICD-10-CM

## 2022-08-09 LAB — POCT INR: INR: 1.3 — AB (ref 2.0–3.0)

## 2022-08-09 NOTE — Patient Instructions (Addendum)
Description   Take 2 tablets today of warfarin then START taking warfarin 1 tablet daily except for 1/2 a tablet on Sundays. Recheck INR in 2 weeks.  Coumadin Clinic [phone]

## 2022-08-23 ENCOUNTER — Ambulatory Visit: Payer: Medicare HMO | Attending: Cardiology | Admitting: *Deleted

## 2022-08-23 DIAGNOSIS — Z5181 Encounter for therapeutic drug level monitoring: Secondary | ICD-10-CM

## 2022-08-23 DIAGNOSIS — Z952 Presence of prosthetic heart valve: Secondary | ICD-10-CM

## 2022-08-23 LAB — POCT INR: INR: 1.9 — AB (ref 2.0–3.0)

## 2022-08-23 NOTE — Patient Instructions (Signed)
Description   Take 1.5 tablets today of warfarin then START taking warfarin 1 tablet daily. Recheck INR in 2 weeks. Coumadin Clinic [phone]

## 2022-09-08 ENCOUNTER — Ambulatory Visit: Payer: Medicare HMO | Attending: Cardiovascular Disease

## 2022-09-08 ENCOUNTER — Telehealth: Payer: Self-pay | Admitting: Cardiovascular Disease

## 2022-09-08 DIAGNOSIS — Z952 Presence of prosthetic heart valve: Secondary | ICD-10-CM

## 2022-09-08 DIAGNOSIS — Z5181 Encounter for therapeutic drug level monitoring: Secondary | ICD-10-CM | POA: Diagnosis not present

## 2022-09-08 LAB — POCT INR: INR: 2.4 (ref 2.0–3.0)

## 2022-09-08 NOTE — Patient Instructions (Signed)
Continue taking warfarin 1 tablet daily. Recheck INR in 4 weeks. Coumadin Clinic [phone]

## 2022-10-04 ENCOUNTER — Telehealth: Payer: Self-pay | Admitting: Cardiovascular Disease

## 2022-10-04 NOTE — Telephone Encounter (Signed)
Patient states she is scheduled for dental work tomorrow and she would like to know if she needs a new prescription for Amoxicillin. Please advise.

## 2022-10-04 NOTE — Telephone Encounter (Signed)
Returned call to patient who states that she is having a basic dental cleaning tomorrow morning- per our DOD should not need antibiotics prior. Patient states she would like to check with Dr. Lalla Brothers. Patient reports that she is sure that it is only a basic cleaning to be done tomorrow morning at 10am. If patient needs antibiotics she will need them sent in by 830am. Will forward to Dr. Lalla Brothers.

## 2022-10-09 NOTE — Telephone Encounter (Signed)
Lanier Prude, MD  B[place]ins, Cleotilde Neer, RN; [facility] [address] Triage2 days ago   No antibiotics needed.  Called patient and let her know.

## 2022-10-11 ENCOUNTER — Ambulatory Visit: Payer: Medicare HMO | Admitting: Student

## 2022-10-11 NOTE — Progress Notes (Signed)
PCP:  Merri Brunette, MD Primary Cardiologist: Donato Schultz, MD Electrophysiologist: Lanier Prude, MD   Tracy Vargas is a 88 y.o. female seen today for Lanier Prude, MD for routine electrophysiology followup. Since last being seen in our clinic the patient reports doing really well, does not think she has had any reoccurrence of AF since last being seen.  She continues to take tikosyn twice a day diligently. AC with coumadin, managed at coumadin clinic. Has had a lot of variability with INR in past so not eligible for home monitoring until more stable.   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 Has some in-home help a couple times a week. She is accompanied by a friend during today's visit. She and the friend get lunch regularly.  She has some BLE edema at the end of the day, is gone by the mornings. She has not tried compression socks, but is willing to try.   .  she denies chest pain, palpitations, dyspnea, PND, orthopnea, nausea, vomiting, dizziness, syncope, weight gain, or early satiety.   Past Medical History:  Diagnosis Date   Acquired hyperlipoproteinemia    Chronic diastolic heart failure (HCC) 09/01/2013   Diastolic dysfunction    Dyspnea    HTN (hypertension)    Hyperlipidemia    Osteopenia    Past Surgical History:  Procedure Laterality Date   APPENDECTOMY     CARDIAC VALVE REPLACEMENT     CARDIOVERSION N/A 08/24/2021   Procedure: CARDIOVERSION;  Surgeon: Quintella Reichert, MD;  Location: [facility] ENDOSCOPY;  Service: Cardiovascular;  Laterality: N/A;   CATARACT EXTRACTION     ESOPHAGOGASTRODUODENOSCOPY N/A 07/14/2016   Procedure: ESOPHAGOGASTRODUODENOSCOPY (EGD);  Surgeon: Dorena Cookey, MD;  Location[facility] END[facility]COPY;  Service: Endoscopy;  Laterality: N/A;   ROTATOR CUFF REPAIR     TEE WITHOUT CARDIOVERSION N/A 08/24/2021   Procedure: TRANSESOPHAGEAL ECHOCARDIOGRAM (TEE);  Surgeon: Quintella Reichert, MD;  Location: [facility] ENDOSCOPY;  Service: Cardiovascular;   Laterality: N/A;   TONSILLECTOMY      Current Outpatient Medications  Medication Sig Dispense Refill   alum & mag hydroxide-simeth (MYLANTA MAXIMUM STRENGTH) 400-400-40 MG/5ML suspension Take 15 mLs by mouth every 6 (six) hours as needed for indigestion.     Alum & Mag Hydroxide-Simeth (MYLANTA PO) Powder adds to her juice as needed     amLODipine (NORVASC) 2.5 MG tablet Take 2.5 mg by mouth daily.     Ascorbic Acid (VITAMIN C) 1000 MG tablet Take 1,000 mg by mouth daily.     atorvastatin (LIPITOR) 10 MG tablet Take 10 mg by mouth daily at 6 PM.      b complex vitamins capsule Take 1 capsule by mouth daily.     Biotin 5000 MCG TABS Take 5,000 mcg by mouth once a week.     calcium citrate-vitamin D (CITRACAL+D) 315-200 MG-UNIT tablet Take 1 tablet by mouth at bedtime.     diltiazem (CARDIZEM SR) 120 MG 12 hr capsule Take 1 capsule (120 mg total) by mouth 2 (two) times daily. 180 capsule 3   docusate sodium (COLACE) 250 MG capsule Take 250 mg by mouth as needed for constipation.     dofetilide (TIKOSYN) 250 MCG capsule Take 1 capsule (250 mcg total) by mouth 2 (two) times daily. 180 capsule 2   ferrous sulfate 325 (65 FE) MG tablet Take 325 mg by mouth once a week.     furosemide (LASIX) 20 MG tablet Take 1 tablet (20 mg  total) by mouth daily as needed for edema (Takes when not leaving the house).     levothyroxine (SYNTHROID) 50 MCG tablet Take 50 mcg by mouth daily.     Magnesium 400 MG CAPS Take 400 mg by mouth every other day. Triple complex     omega-3 acid ethyl esters (LOVAZA) 1 g capsule Take 1 g by mouth daily.     OVER THE COUNTER MEDICATION Take 1 capsule by mouth 3 (three) times daily with meals. LipoSan Ultra Chitosan     Polyethyl Glycol-Propyl Glycol (SYSTANE) 0.4-0.3 % SOLN Place 1 drop into both eyes 2 (two) times daily.     polyethylene glycol (MIRALAX / GLYCOLAX) 17 g packet Take 17 g by mouth as needed.     valsartan (DIOVAN) 160 MG tablet Take 1 tablet (160 mg total) by  mouth daily. 30 tablet 1   warfarin (JANTOVEN) 5 MG tablet Take 2.5 mg by mouth daily except 5mg  on Monday, Wednesday, and Friday or as directed by Anticoagulation Clinic. 70 tablet 1   zinc gluconate 50 MG tablet Take 50 mg by mouth daily.     No current facility-administered medications for this visit.    Allergies  Allergen Reactions   Codeine     Halluciations   Sudafed [Pseudoephedrine Hcl]     Halluications   Cefuroxime    Sudafed [Pseudoephedrine] Other (See Comments)    Social History   Socioeconomic History   Marital status: Married    Spouse name: Not on file   Number of children: Not on file   Years of education: Not on file   Highest education level: Not on file  Occupational History   Not on file  Tobacco Use   Smoking status: Never   Smokeless tobacco: Never  Substance and Sexual Activity   Alcohol use: No   Drug use: No   Sexual activity: Not on file  Other Topics Concern   Not on file  Social History Narrative   Not on file   Social Determinants of Health   Financial Resource Strain: Not on file  Food Insecurity: Not on file  Transportation Needs: Not on file  Physical Activity: Not on file  Stress: Not on file  Social Connections: Not on file  Intimate Partner Violence: Not on file     Review of Systems: All other systems reviewed and are otherwise negative except as noted above.  Physical Exam: Vitals:   10/12/22 1100  BP: 134/70  Pulse: 78  Weight: 149 lb 9.6 oz (67.9 kg)  Height: 5\' 1"  (1.549 m)    GEN- The patient is well appearing, alert and oriented x 3 today.   HEENT: normocephalic, atraumatic; sclera clear, conjunctiva pink; hearing intact; oropharynx clear; neck supple, no JVP Lymph- no cervical lymphadenopathy Lungs- Clear to ausculation bilaterally, normal work of breathing.  No wheezes, rales, rhonchi Heart- Regular rate and rhythm, no murmurs, rubs or gallops, PMI not laterally displaced, mechanical valve click  crisp GI- soft, non-tender, non-distended, bowel sounds present, no hepatosplenomegaly Extremities- 1+ peripheral edema. no clubbing or cyanosis; DP/PT/radial pulses 2+ bilaterally MS- no significant deformity or atrophy Skin- warm and dry, no rash or lesion Psych- euthymic mood, full affect Neuro- strength and sensation are intact  EKG is ordered. Personal review of EKG from today shows NSR at 78bpm, stable QTC  Additional studies reviewed include: Previous EP notes.     Assessment and Plan:  1. Persistent atrial fibrillation EKG today shows SR QTC stable Continue  tikosyn Labs today  AC - warfarin, managed at clinic CHA2DS2-VASc Score = 4 [CHF History: 1, HTN History: 0, Diabetes History: 0, Stroke History: 0, Vascular Disease History: 0, Age Score: 2, Gender Score: 1].  Therefore, the patient's annual risk of stroke is 4.8 %.       2. HTN Stable on current regimen    3. Chronic diastolic CHF Volume status stable. encouraged compression socks  4. Mechanical Aortic valve Continue coumadin Functioning well by last echo  Follow up with Dr. Lalla Brothers in 3 months  Sherie Don, NP  10/12/22 1:20 PM

## 2022-10-12 ENCOUNTER — Ambulatory Visit: Payer: Medicare HMO | Attending: Student | Admitting: Cardiology

## 2022-10-12 ENCOUNTER — Encounter: Payer: Self-pay | Admitting: Student

## 2022-10-12 VITALS — BP 134/70 | HR 78 | Ht 61.0 in | Wt 149.6 lb

## 2022-10-12 DIAGNOSIS — I10 Essential (primary) hypertension: Secondary | ICD-10-CM

## 2022-10-12 DIAGNOSIS — I4819 Other persistent atrial fibrillation: Secondary | ICD-10-CM

## 2022-10-12 DIAGNOSIS — I5032 Chronic diastolic (congestive) heart failure: Secondary | ICD-10-CM

## 2022-10-12 DIAGNOSIS — Z952 Presence of prosthetic heart valve: Secondary | ICD-10-CM

## 2022-10-12 LAB — BASIC METABOLIC PANEL
BUN/Creatinine Ratio: 24 (ref 12–28)
BUN: 21 mg/dL (ref 8–27)
CO2: 23 mmol/L (ref 20–29)
Calcium: 9.4 mg/dL (ref 8.7–10.3)
Chloride: 103 mmol/L (ref 96–106)
Creatinine, Ser: 0.88 mg/dL (ref 0.57–1.00)
Glucose: 89 mg/dL (ref 70–99)
Potassium: 5.2 mmol/L (ref 3.5–5.2)
Sodium: 140 mmol/L (ref 134–144)
eGFR: 64 mL/min/{1.73_m2} (ref >59)

## 2022-10-12 LAB — MAGNESIUM: Magnesium: 2.3 mg/dL (ref 1.6–2.3)

## 2022-10-12 NOTE — Patient Instructions (Addendum)
Medication Instructions:  Your physician recommends that you continue on your current medications as directed. Please refer to the Current Medication list given to you today.  *If you need a refill on your cardiac medications before your next appointment, please call your pharmacy*  Lab Work: You will have blood work drawn today:  BMET, and Magnesium Today.    Testing/Procedures: None ordered.  Follow-Up: At [facility], you and your health needs are our priority.  As part of our continuing mission to provide you with exceptional heart care, we have created designated Provider Care Teams.  These Care Teams include your primary Cardiologist (physician) and Advanced Practice Providers (APPs -  Physician Assistants and Nurse Practitioners) who all work together to provide you with the care you need, when you need it.  We recommend signing up for the patient portal called "MyChart".  Sign up information is provided on this After Visit Summary.  MyChart is used to connect with patients for Virtual Visits (Telemedicine).  Patients are able to view lab/test results, encounter notes, upcoming appointments, etc.  Non-urgent messages can be sent to your provider as well.   To learn more about what you can do with MyChart, go to [email or web address].    Your next appointment:   Please schedule a 3 month follow up appointment with Dr. Lalla Brothers  The format for your next appointment:   In Person  Provider:   Sherie Don, NP   Important Information About Sugar

## 2022-10-13 ENCOUNTER — Ambulatory Visit: Payer: Medicare HMO | Attending: Internal Medicine | Admitting: *Deleted

## 2022-10-13 ENCOUNTER — Telehealth: Payer: Self-pay

## 2022-10-13 DIAGNOSIS — Z952 Presence of prosthetic heart valve: Secondary | ICD-10-CM

## 2022-10-13 DIAGNOSIS — Z5181 Encounter for therapeutic drug level monitoring: Secondary | ICD-10-CM

## 2022-10-13 LAB — POCT INR: INR: 2.1 (ref 2.0–3.0)

## 2022-10-13 NOTE — Telephone Encounter (Signed)
-----   Message from Sherie Don, NP sent at 10/13/2022  8:29 AM EST ----- K is a little high, please limit K in diet (bananas, potatoes, etc.) and repeat labs in 2 weeks to make sure isn't continuing to rise.

## 2022-10-13 NOTE — Telephone Encounter (Signed)
Pt called per Ms. Riddle NP note regarding elevated K of 5.2;    Pt made aware of K level at 10/12/22 appointment, and educated on how to decrease K in her diet.    When asked about 2 week follow up lab draw appointment, Pt stated she is seeing her PCP next Wednesday, and would rather have it rechecked then. 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    I told her this shouldn't be a problem, and will reach out to Dr. Merri Brunette requesting a Potassium / BMET recheck at this upcoming appointment.  Per Ms. Riddle NP, ok to re-evaluate Pt potassium with PCP appointment next week.  I was unable to contact Dr. Hulan Fess RN at [facility], but spoke to office assistant Wille Glaser.  Ms. Wille Glaser stated she will forward this request to Dr. Erby Pian and RN, and shouldn't be an issue.  The will redraw / recheck Potassium per Ms. Riddle NP with [facility]'s request.  Ms. Wille Glaser stated they would fax the results to me;  Fax number and [facility] telephone number provided to [place].    Will look for this fax mid to late week, 12/14 or 12/15.   Follow up with Potassium and reporting is required.

## 2022-10-13 NOTE — Patient Instructions (Signed)
Description   Continue taking warfarin 1 tablet daily. Recheck INR in 5 weeks. Coumadin Clinic [phone]     

## 2022-10-18 DIAGNOSIS — N3281 Overactive bladder: Secondary | ICD-10-CM | POA: Diagnosis not present

## 2022-10-18 DIAGNOSIS — Z0000 Encounter for general adult medical examination without abnormal findings: Secondary | ICD-10-CM | POA: Diagnosis not present

## 2022-10-18 DIAGNOSIS — E039 Hypothyroidism, unspecified: Secondary | ICD-10-CM | POA: Diagnosis not present

## 2022-10-18 DIAGNOSIS — Z952 Presence of prosthetic heart valve: Secondary | ICD-10-CM | POA: Diagnosis not present

## 2022-10-18 DIAGNOSIS — I4891 Unspecified atrial fibrillation: Secondary | ICD-10-CM | POA: Diagnosis not present

## 2022-10-18 DIAGNOSIS — Z23 Encounter for immunization: Secondary | ICD-10-CM | POA: Diagnosis not present

## 2022-10-18 DIAGNOSIS — I10 Essential (primary) hypertension: Secondary | ICD-10-CM | POA: Diagnosis not present

## 2022-10-18 DIAGNOSIS — K219 Gastro-esophageal reflux disease without esophagitis: Secondary | ICD-10-CM | POA: Diagnosis not present

## 2022-10-18 DIAGNOSIS — D6869 Other thrombophilia: Secondary | ICD-10-CM | POA: Diagnosis not present

## 2022-10-18 DIAGNOSIS — R32 Unspecified urinary incontinence: Secondary | ICD-10-CM | POA: Diagnosis not present

## 2022-10-18 DIAGNOSIS — E7800 Pure hypercholesterolemia, unspecified: Secondary | ICD-10-CM | POA: Diagnosis not present

## 2022-10-18 DIAGNOSIS — Z1331 Encounter for screening for depression: Secondary | ICD-10-CM | POA: Diagnosis not present

## 2022-10-23 NOTE — Telephone Encounter (Signed)
Called [facility] to F/U and obtain K level from Dr. Merri Brunette.  Report faxed from PCP office to [facility] on [address]  ( Potassium shared via private message to Sherie Don NP, to make aware K is now 4.9 mmol/L per report.  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    I will have the lab report scanned into the patients health record, and make Ms. Riddle NP aware.

## 2022-11-16 DIAGNOSIS — I10 Essential (primary) hypertension: Secondary | ICD-10-CM | POA: Diagnosis not present

## 2022-11-16 DIAGNOSIS — I4891 Unspecified atrial fibrillation: Secondary | ICD-10-CM | POA: Diagnosis not present

## 2022-11-16 DIAGNOSIS — R079 Chest pain, unspecified: Secondary | ICD-10-CM | POA: Diagnosis not present

## 2022-11-16 DIAGNOSIS — E039 Hypothyroidism, unspecified: Secondary | ICD-10-CM | POA: Diagnosis not present

## 2022-11-20 ENCOUNTER — Telehealth: Payer: Self-pay | Admitting: Cardiology

## 2022-11-20 NOTE — Telephone Encounter (Signed)
Called pt reports had a fall went to Urgent Care was only told to take tylenol.  Reports pain has increased and tylenol is not working.  Takes care of spouse; pain was so severe had difficulty feeding him.  Advised pt to call PCP office and tell them exactly what told me.  Our office does not prescribe pain medications.  Pt expressed does not know what pain medications can take with heart meds.  Advised PCP office will be able to assist.  Pt expresses will contact PCP office.

## 2022-11-20 NOTE — Telephone Encounter (Signed)
Pt states she fell and she has pain in her right breast and around her shoulder. She states she had a xray at urgent care in [place], [place] and they told her nothing was broken. Pt wants to know if Dr. Marlou Porch can prescribe her some pain medication and if its okay for her to use lidocaine 5% patches with her heart medications. Please advise.

## 2022-11-23 ENCOUNTER — Ambulatory Visit: Payer: Medicare HMO | Attending: Cardiology

## 2022-11-23 DIAGNOSIS — Z952 Presence of prosthetic heart valve: Secondary | ICD-10-CM

## 2022-11-23 DIAGNOSIS — Z5181 Encounter for therapeutic drug level monitoring: Secondary | ICD-10-CM | POA: Diagnosis not present

## 2022-11-23 LAB — POCT INR: INR: 2.4 (ref 2.0–3.0)

## 2022-11-23 NOTE — Patient Instructions (Signed)
Description   Continue taking warfarin 1 tablet daily. Recheck INR in 6 weeks. Coumadin Clinic [phone]     

## 2022-12-14 ENCOUNTER — Encounter (HOSPITAL_COMMUNITY): Payer: Self-pay | Admitting: *Deleted

## 2023-01-04 ENCOUNTER — Ambulatory Visit: Payer: Medicare HMO | Attending: Cardiology

## 2023-01-04 DIAGNOSIS — Z952 Presence of prosthetic heart valve: Secondary | ICD-10-CM | POA: Diagnosis not present

## 2023-01-04 DIAGNOSIS — Z5181 Encounter for therapeutic drug level monitoring: Secondary | ICD-10-CM

## 2023-01-04 LAB — POCT INR: INR: 1.8 — AB (ref 2.0–3.0)

## 2023-01-04 NOTE — Patient Instructions (Signed)
Description   Take 1.5 tablets today and then continue taking warfarin 1 tablet daily.  Recheck INR in 5 weeks.  Coumadin Clinic [phone]

## 2023-01-11 DIAGNOSIS — E039 Hypothyroidism, unspecified: Secondary | ICD-10-CM | POA: Diagnosis not present

## 2023-01-16 ENCOUNTER — Ambulatory Visit: Payer: Medicare HMO | Admitting: Cardiology

## 2023-01-22 ENCOUNTER — Ambulatory Visit: Payer: Medicare HMO | Admitting: Student

## 2023-01-25 ENCOUNTER — Ambulatory Visit: Payer: Medicare HMO | Admitting: Student

## 2023-01-31 ENCOUNTER — Telehealth: Payer: Self-pay | Admitting: Cardiology

## 2023-01-31 MED ORDER — DOFETILIDE 250 MCG PO CAPS: 250.0000 ug | ORAL_CAPSULE | Freq: Two times a day (BID) | ORAL | 2 refills | Status: DC

## 2023-01-31 MED ORDER — DILTIAZEM HCL ER 120 MG PO CP12: 120.0000 mg | ORAL_CAPSULE | Freq: Two times a day (BID) | ORAL | 2 refills | Status: DC

## 2023-01-31 NOTE — Telephone Encounter (Signed)
Pt's medications were sent to pt's pharmacy as requested. Confirmation received.  

## 2023-01-31 NOTE — Progress Notes (Signed)
  Electrophysiology Office Note:   Date:  02/05/2023  ID:  Tracy Vargas, DOB Feb 02, 1934, MRN [id number]  Primary Cardiologist: Candee Furbish, MD Electrophysiologist: Vickie Epley, MD   History of Present Illness:   Tracy Vargas is a 89 y.o. female with h/o Persistent AF on tikosyn, HTN, chronic diatolic CHF, and Mechanical aortic valve on coumadin seen today for routine electrophysiology followup. Since last being seen in our clinic the patient reports doing well from a cardiac perspective.   She lost her husband of nearly 50 years last month.  It was not unexpected, as he had been bed ridden since July. She may have missed a dose in the setting of taking care of him, but never more than 1.  She has noted a little more SOB over the weekend, but has not tried her prn lasix. she denies chest pain, palpitations, PND, orthopnea, nausea, vomiting, dizziness, syncope, edema, weight gain, or early satiety.   Review of systems complete and found to be negative unless listed in HPI.   Studies Reviewed:    EKG is ordered today. Personal review shows NSR at 68 bpm with QT ~470-480   Risk Assessment/Calculations:           Physical Exam:   VS:  BP 132/70   Pulse 68   Ht 5\' 1"  (1.549 m)   Wt 157 lb 3.2 oz (71.3 kg)   SpO2 96%   BMI 29.70 kg/m    Wt Readings from Last 3 Encounters:  02/05/23 157 lb 3.2 oz (71.3 kg)  10/12/22 149 lb 9.6 oz (67.9 kg)  07/17/22 148 lb 3.2 oz (67.2 kg)     GEN: Well nourished, well developed in no acute distress NECK: No JVD; No carotid bruits CARDIAC: Regular rate and rhythm, no murmurs, rubs, gallops RESPIRATORY:  Clear to auscultation without rales, wheezing or rhonchi  ABDOMEN: Soft, non-tender, non-distended EXTREMITIES:  No edema; No deformity   ASSESSMENT AND PLAN:    Persistent atrial fibrillation EKG today shows NSR with stable intervals Continue tikosyn 250 mcg BID Labs today Continue coumadin per coumadin clinic for CHA2DS2VASc  of at least  4.    HTN Stable on current regimen    Chronic diastolic CHF Volume status stable   Mechanical Aortic valve Stable by last echo. Coumadin as above.   Follow up with Dr. Quentin Ore in 3 months  Signed, Shirley Friar, PA-C

## 2023-01-31 NOTE — Telephone Encounter (Signed)
*  STAT* If patient is at the pharmacy, call can be transferred to refill team.   1. Which medications need to be refilled? (please list name of each medication and dose if known)   diltiazem (CARDIZEM SR) 120 MG 12 hr capsule   dofetilide (TIKOSYN) 250 MCG capsule   2. Which pharmacy/location (including street and city if local pharmacy) is medication to be sent to? [facility] Fax [phone] [address]scher [phone]   3. Do they need a 30 day or 90 day supply? 90 day   Pt has changed her insurance to [facility] and is now using [facility] as her new mail order pharmacy. Pharmacy told her to call and give updated information so that she can receive her refills on above medications.

## 2023-02-05 ENCOUNTER — Ambulatory Visit: Payer: Medicare HMO | Attending: Cardiology | Admitting: Student

## 2023-02-05 ENCOUNTER — Encounter: Payer: Self-pay | Admitting: Student

## 2023-02-05 VITALS — BP 132/70 | HR 68 | Ht 61.0 in | Wt 157.2 lb

## 2023-02-05 DIAGNOSIS — I10 Essential (primary) hypertension: Secondary | ICD-10-CM | POA: Diagnosis not present

## 2023-02-05 DIAGNOSIS — Z952 Presence of prosthetic heart valve: Secondary | ICD-10-CM

## 2023-02-05 DIAGNOSIS — I5032 Chronic diastolic (congestive) heart failure: Secondary | ICD-10-CM | POA: Diagnosis not present

## 2023-02-05 DIAGNOSIS — I4819 Other persistent atrial fibrillation: Secondary | ICD-10-CM

## 2023-02-05 NOTE — Patient Instructions (Signed)
Medication Instructions:  Your physician recommends that you continue on your current medications as directed. Please refer to the Current Medication list given to you today.  *If you need a refill on your cardiac medications before your next appointment, please call your pharmacy*  Lab Work: BMET, MAG--TODAY If you have labs (blood work) drawn today and your tests are completely normal, you will receive your results only by: [place] (if you have MyChart) OR A paper copy in the mail If you have any lab test that is abnormal or we need to change your treatment, we will call you to review the results.  Follow-Up: At [facility], you and your health needs are our priority.  As part of our continuing mission to provide you with exceptional heart care, we have created designated Provider Care Teams.  These Care Teams include your primary Cardiologist (physician) and Advanced Practice Providers (APPs -  Physician Assistants and Nurse Practitioners) who all work together to provide you with the care you need, when you need it.  Your next appointment:   3 month(s)  Provider:   Lars Mage, MD

## 2023-02-06 ENCOUNTER — Telehealth: Payer: Self-pay | Admitting: Cardiology

## 2023-02-06 LAB — BASIC METABOLIC PANEL
BUN/Creatinine Ratio: 22 (ref 12–28)
BUN: 17 mg/dL (ref 8–27)
CO2: 23 mmol/L (ref 20–29)
Calcium: 9.1 mg/dL (ref 8.7–10.3)
Chloride: 104 mmol/L (ref 96–106)
Creatinine, Ser: 0.77 mg/dL (ref 0.57–1.00)
Glucose: 85 mg/dL (ref 70–99)
Potassium: 5.2 mmol/L (ref 3.5–5.2)
Sodium: 140 mmol/L (ref 134–144)
eGFR: 75 mL/min/{1.73_m2} (ref >59)

## 2023-02-06 LAB — MAGNESIUM: Magnesium: 2.4 mg/dL — ABNORMAL HIGH (ref 1.6–2.3)

## 2023-02-06 MED ORDER — FUROSEMIDE 20 MG PO TABS: 20.0000 mg | ORAL_TABLET | Freq: Every day | ORAL | 8 refills | Status: AC | PRN

## 2023-02-06 NOTE — Telephone Encounter (Signed)
Pt's medication was sent to pt's pharmacy as requested. Confirmation received.  °

## 2023-02-06 NOTE — Telephone Encounter (Signed)
*  STAT* If patient is at the pharmacy, call can be transferred to refill team.   1. Which medications need to be refilled? (please list name of each medication and dose if known)   furosemide (LASIX) 20 MG tablet   2. Which pharmacy/location (including street and city if local pharmacy) is medication to be sent to?  [place] [place] [address]   3. Do they need a 30 day or 90 day supply?   30 day  Patient is completely out of this medication.

## 2023-02-08 ENCOUNTER — Ambulatory Visit: Payer: Medicare HMO | Attending: Cardiology

## 2023-02-08 DIAGNOSIS — Z952 Presence of prosthetic heart valve: Secondary | ICD-10-CM

## 2023-02-08 DIAGNOSIS — Z5181 Encounter for therapeutic drug level monitoring: Secondary | ICD-10-CM

## 2023-02-08 LAB — POCT INR: INR: 1.5 — AB (ref 2.0–3.0)

## 2023-02-08 NOTE — Patient Instructions (Signed)
Description   Take 1.5 tablets today and then START taking warfarin 1 tablet daily except 1.5 tablets on Sundays.  Stay consistent with greens each week Recheck INR in 3 weeks.  Coumadin Clinic [phone]

## 2023-03-01 ENCOUNTER — Ambulatory Visit: Payer: Medicare HMO | Attending: Cardiology | Admitting: *Deleted

## 2023-03-01 DIAGNOSIS — Z5181 Encounter for therapeutic drug level monitoring: Secondary | ICD-10-CM | POA: Diagnosis not present

## 2023-03-01 DIAGNOSIS — Z952 Presence of prosthetic heart valve: Secondary | ICD-10-CM | POA: Diagnosis not present

## 2023-03-01 LAB — POCT INR: INR: 2 (ref 2.0–3.0)

## 2023-03-01 NOTE — Patient Instructions (Addendum)
Description   Take 1.5 tablets today and then continue taking warfarin 1 tablet daily except 1.5 tablets on Sundays. Stay consistent with greens each week Recheck INR in 4 weeks per patient request.  Coumadin Clinic [phone]

## 2023-03-15 DIAGNOSIS — Z961 Presence of intraocular lens: Secondary | ICD-10-CM | POA: Diagnosis not present

## 2023-03-15 DIAGNOSIS — Z0100 Encounter for examination of eyes and vision without abnormal findings: Secondary | ICD-10-CM | POA: Diagnosis not present

## 2023-03-29 ENCOUNTER — Ambulatory Visit: Payer: Medicare HMO | Attending: Cardiology | Admitting: *Deleted

## 2023-03-29 DIAGNOSIS — Z952 Presence of prosthetic heart valve: Secondary | ICD-10-CM | POA: Diagnosis not present

## 2023-03-29 DIAGNOSIS — Z5181 Encounter for therapeutic drug level monitoring: Secondary | ICD-10-CM

## 2023-03-29 LAB — POCT INR: POC INR: 2.3

## 2023-03-29 NOTE — Patient Instructions (Signed)
Description   Continue taking warfarin 1 tablet daily except 1.5 tablets on Sundays. Stay consistent with greens each week Recheck INR in 5 weeks per patient request.  Coumadin Clinic [phone]

## 2023-05-03 ENCOUNTER — Ambulatory Visit: Payer: Medicare HMO

## 2023-05-03 DIAGNOSIS — I110 Hypertensive heart disease with heart failure: Secondary | ICD-10-CM | POA: Diagnosis not present

## 2023-05-03 DIAGNOSIS — E039 Hypothyroidism, unspecified: Secondary | ICD-10-CM | POA: Diagnosis not present

## 2023-05-03 DIAGNOSIS — I700 Atherosclerosis of aorta: Secondary | ICD-10-CM | POA: Diagnosis not present

## 2023-05-03 DIAGNOSIS — Z952 Presence of prosthetic heart valve: Secondary | ICD-10-CM | POA: Diagnosis not present

## 2023-05-03 DIAGNOSIS — Z7901 Long term (current) use of anticoagulants: Secondary | ICD-10-CM | POA: Diagnosis not present

## 2023-05-03 DIAGNOSIS — I4891 Unspecified atrial fibrillation: Secondary | ICD-10-CM | POA: Diagnosis not present

## 2023-05-03 DIAGNOSIS — I5032 Chronic diastolic (congestive) heart failure: Secondary | ICD-10-CM | POA: Diagnosis not present

## 2023-05-03 DIAGNOSIS — I10 Essential (primary) hypertension: Secondary | ICD-10-CM | POA: Diagnosis not present

## 2023-05-03 DIAGNOSIS — D6869 Other thrombophilia: Secondary | ICD-10-CM | POA: Diagnosis not present

## 2023-05-03 DIAGNOSIS — E7800 Pure hypercholesterolemia, unspecified: Secondary | ICD-10-CM | POA: Diagnosis not present

## 2023-05-17 ENCOUNTER — Ambulatory Visit: Payer: Medicare HMO | Attending: Cardiology

## 2023-05-17 DIAGNOSIS — Z5181 Encounter for therapeutic drug level monitoring: Secondary | ICD-10-CM

## 2023-05-17 DIAGNOSIS — Z952 Presence of prosthetic heart valve: Secondary | ICD-10-CM

## 2023-05-17 LAB — POCT INR: INR: 2.1 (ref 2.0–3.0)

## 2023-05-17 NOTE — Patient Instructions (Signed)
Description   Continue taking warfarin 1 tablet daily except 1.5 tablets on Sundays. Stay consistent with greens each week Recheck INR in 6 weeks.  Coumadin Clinic [phone]

## 2023-06-07 ENCOUNTER — Encounter: Payer: Self-pay | Admitting: Cardiology

## 2023-06-07 ENCOUNTER — Ambulatory Visit: Payer: Medicare HMO | Attending: Cardiology | Admitting: Cardiology

## 2023-06-07 VITALS — BP 132/64 | HR 64 | Ht 61.0 in | Wt 153.8 lb

## 2023-06-07 DIAGNOSIS — I10 Essential (primary) hypertension: Secondary | ICD-10-CM | POA: Diagnosis not present

## 2023-06-07 DIAGNOSIS — I4819 Other persistent atrial fibrillation: Secondary | ICD-10-CM | POA: Diagnosis not present

## 2023-06-07 DIAGNOSIS — I5032 Chronic diastolic (congestive) heart failure: Secondary | ICD-10-CM | POA: Diagnosis not present

## 2023-06-07 DIAGNOSIS — Z79899 Other long term (current) drug therapy: Secondary | ICD-10-CM | POA: Diagnosis not present

## 2023-06-07 LAB — BASIC METABOLIC PANEL
BUN/Creatinine Ratio: 25 (ref 12–28)
BUN: 21 mg/dL (ref 8–27)
CO2: 23 mmol/L (ref 20–29)
Calcium: 9.4 mg/dL (ref 8.7–10.3)
Chloride: 106 mmol/L (ref 96–106)
Creatinine, Ser: 0.83 mg/dL (ref 0.57–1.00)
Glucose: 91 mg/dL (ref 70–99)
Potassium: 5.2 mmol/L (ref 3.5–5.2)
Sodium: 140 mmol/L (ref 134–144)
eGFR: 68 mL/min/{1.73_m2} (ref >59)

## 2023-06-07 LAB — MAGNESIUM: Magnesium: 2.4 mg/dL — ABNORMAL HIGH (ref 1.6–2.3)

## 2023-06-07 NOTE — Patient Instructions (Addendum)
Medication Instructions:  Your physician recommends that you continue on your current medications as directed. Please refer to the Current Medication list given to you today.  *If you need a refill on your cardiac medications before your next appointment, please call your pharmacy*  Lab Work: TODAY: BMET and Mag If you have labs (blood work) drawn today and your tests are completely normal, you will receive your results only by: MyChart Message (if you have MyChart) OR A paper copy in the mail If you have any lab test that is abnormal or we need to change your treatment, we will call you to review the results.  Follow-Up: At [facility], you and your health needs are our priority.  As part of our continuing mission to provide you with exceptional heart care, we have created designated Provider Care Teams.  These Care Teams include your primary Cardiologist (physician) and Advanced Practice Providers (APPs -  Physician Assistants and Nurse Practitioners) who all work together to provide you with the care you need, when you need it.  Your next appointment:   4 months  Provider:   You will see one of the following Advanced Practice Providers on your designated Care Team:   Francis Dowse, Charlott Holler [address] [employer], [place] Sherie Don, NP Canary Brim, NP

## 2023-06-07 NOTE — Progress Notes (Signed)
  Electrophysiology Office Follow up Visit Note:    Date:  06/07/2023   ID:  Tracy Vargas, DOB 03-29-1934, MRN [id number]  PCP:  Merri Brunette, MD  [facility] Cardiologist:  Donato Schultz, MD  [facility] Electrophysiologist:  Lanier Prude, MD    Interval History:    Tracy Vargas is a 89 y.o. female who presents for a follow up visit.   Last seen February 05, 2023 by Mardelle Matte.  The patient has persistent atrial fibrillation managed with dofetilide, hypertension, diastolic heart failure and mechanical aortic valve on Coumadin. She is with her friend today in clinic.  She has been doing well from a cardiovascular perspective.  She had an episode of right lower leg cramping yesterday that she was concerned about.  It has not recurred.  No problems with her medicines.      Past medical, surgical, social and family history were reviewed.  ROS:   Please see the history of present illness.    All other systems reviewed and are negative.  EKGs/Labs/Other Studies Reviewed:    The following studies were reviewed today:    EKG Interpretation Date/Time:  Thursday June 07 2023 08:45:30 EDT Ventricular Rate:  64 PR Interval:  216 QRS Duration:  102 QT Interval:  458 QTC Calculation: 472 R Axis:   -12  Text Interpretation: Sinus rhythm with 1st degree A-V block When compared with ECG of 05-Jan-2022 14:33, QRS duration has increased Confirmed by Steffanie Dunn [phone]) on 06/07/2023 8:46:10 AM    Physical Exam:    VS:  BP 132/64   Pulse 64   Ht 5\' 1"  (1.549 m)   Wt 153 lb 12.8 oz (69.8 kg)   SpO2 97%   BMI 29.06 kg/m     Wt Readings from Last 3 Encounters:  06/07/23 153 lb 12.8 oz (69.8 kg)  02/05/23 157 lb 3.2 oz (71.3 kg)  10/12/22 149 lb 9.6 oz (67.9 kg)     GEN:  Well nourished, well developed in no acute distress CARDIAC: RRR, no murmurs, rubs, gallops RESPIRATORY:  Clear to auscultation without rales, wheezing or rhonchi       ASSESSMENT:    1. Persistent  atrial fibrillation (HCC)   2. Encounter for long-term (current) use of high-risk medication   3. Primary hypertension   4. Chronic diastolic heart failure (HCC)    PLAN:    In order of problems listed above:  #Persistent atrial fibrillation #High risk drug monitoring-dofetilide Maintaining sinus rhythm on Tikosyn.  QTc acceptable for ongoing use. Repeat labs today Continue Coumadin for stroke prophylaxis  #Mechanical aortic valve in situ Prosthesis functioning appropriately on exam.  Continue Coumadin  #Chronic diastolic heart failure NYHA class II.  Warm and dry on exam.  Continue current medical therapy Continue Lasix  #Hypertension At goal today.  Recommend checking blood pressures 1-2 times per week at home and recording the values.  Recommend bringing these recordings to the primary care physician. Continue diltiazem, valsartan   Follow-up 4 months with APP    Signed, Steffanie Dunn, MD, [facility], [facility] 06/07/2023 8:54 AM    Electrophysiology [facility]

## 2023-06-28 ENCOUNTER — Ambulatory Visit: Payer: Medicare HMO | Attending: Cardiovascular Disease

## 2023-06-28 DIAGNOSIS — Z952 Presence of prosthetic heart valve: Secondary | ICD-10-CM | POA: Diagnosis not present

## 2023-06-28 DIAGNOSIS — Z5181 Encounter for therapeutic drug level monitoring: Secondary | ICD-10-CM

## 2023-06-28 LAB — POCT INR: INR: 2.1 (ref 2.0–3.0)

## 2023-06-28 NOTE — Patient Instructions (Signed)
Description   Continue taking warfarin 1 tablet daily except 1.5 tablets on Sundays. Stay consistent with greens each week Recheck INR in 6 weeks.  Coumadin Clinic [phone]

## 2023-08-09 ENCOUNTER — Ambulatory Visit: Payer: Medicare HMO | Attending: Internal Medicine

## 2023-08-09 DIAGNOSIS — Z952 Presence of prosthetic heart valve: Secondary | ICD-10-CM

## 2023-08-09 DIAGNOSIS — Z5181 Encounter for therapeutic drug level monitoring: Secondary | ICD-10-CM | POA: Diagnosis not present

## 2023-08-09 LAB — POCT INR: INR: 2.1 (ref 2.0–3.0)

## 2023-08-09 NOTE — Patient Instructions (Signed)
Description   Continue taking warfarin 1 tablet daily except 1.5 tablets on Sundays. Stay consistent with greens each week Recheck INR in 6 weeks.  Coumadin Clinic [phone]

## 2023-09-20 ENCOUNTER — Ambulatory Visit: Payer: Medicare HMO | Attending: Cardiology

## 2023-09-20 DIAGNOSIS — Z5181 Encounter for therapeutic drug level monitoring: Secondary | ICD-10-CM | POA: Diagnosis not present

## 2023-09-20 DIAGNOSIS — Z952 Presence of prosthetic heart valve: Secondary | ICD-10-CM

## 2023-09-20 LAB — POCT INR: INR: 2 (ref 2.0–3.0)

## 2023-09-20 NOTE — Patient Instructions (Signed)
Description   Take 1.5 tablets today and then continue taking warfarin 1 tablet daily except 1.5 tablets on Sundays.  Stay consistent with greens each week Recheck INR in 7 weeks.  Coumadin Clinic [phone]

## 2023-10-02 NOTE — Progress Notes (Addendum)
Cardiology Office Note:  .   Date:  10/02/2023  ID:  Tracy Vargas, DOB 1934/06/29, MRN [id number] PCP: Merri Brunette, MD  [facility] Providers Cardiologist:  Donato Schultz, MD Electrophysiologist:  Lanier Prude, MD {  History of Present Illness: .   Tracy Vargas is a 89 y.o. female w/PMHx of VHD w/mechanical AVR, AFib, HTN, HLD, chronic CHF (diastolic)  Last saw Dr. Lalla Brothers 06/07/23, stable QTc, volume stable No changes made   Today's visit is scheduled as a 4 mo visit  ROS:   She is accompanied by a long time friend today She is doing GREAT! Goes to an exercise class once/week, lives independently and denies any difficulties with ADLs She does not think she has had any Afib on Tikosyn No CP, SOB, DOE No near syncope or syncope No bleeding or signs of bleeding  She inquires about changing to Dr. Excell Seltzer, reports he was her husband's cardiologist, took very good care of him, and would really like to transition to his care for general cardiology/valve care/management  Arrhythmia/AAD hx AFib 2022 Amiodarone 2022 stopped quickly, pt preference w/concerns of side effects Tikosyn started Feb 2023   Studies Reviewed: Marland Kitchen    EKG done today and reviewed by myself:  SR, manually measured QT 440-464ms/QTc 468-446ms   08/24/21: TEE 1. Left ventricular ejection fraction, by estimation, is 60 to 65%. The  left ventricle has normal function. The left ventricle has no regional  wall motion abnormalities.   2. Right ventricular systolic function is normal. The right ventricular  size is normal.   3. Left atrial size was moderately dilated. No left atrial/left atrial  appendage thrombus was detected. The LAA emptying velocity was 20 cm/s.   4. Right atrial size was mildly dilated.   5. The mitral valve is normal in structure. Moderate mitral valve  regurgitation with 2 prominent jets. No evidence of mitral stenosis.  Moderate mitral annular calcification.   6. Tricuspid  valve regurgitation is moderate.   7. The aortic valve has been repaired/replaced. Aortic valve  regurgitation is not visualized. No aortic stenosis is present. There is a  mechanical valve present in the aortic position.   8. There is mild (Grade II) layered plaque involving the ascending aorta,  aortic root and descending aorta.   9. The inferior vena cava is normal in size with greater than 50%  respiratory variability, suggesting right atrial pressure of 3 mmHg.   Conclusion(s)/Recommendation(s): Normal biventricular function moderate  mitral regurgitation with 2 prominent jets. No LA or LAA thrombus. No  LA/LAA thrombus identified. Successful cardioversion performed with  restoration of normal sinus rhythm.    Risk Assessment/Calculations:    Physical Exam:   VS:  There were no vitals taken for this visit.   Wt Readings from Last 3 Encounters:  06/07/23 153 lb 12.8 oz (69.8 kg)  02/05/23 157 lb 3.2 oz (71.3 kg)  10/12/22 149 lb 9.6 oz (67.9 kg)    GEN: Well nourished, well developed in no acute distress NECK: No JVD; No carotid bruits CARDIAC: RRR, valve appreciated, no murmurs, rubs, gallops RESPIRATORY:   CTA b/l without rales, wheezing or rhonchi  ABDOMEN: Soft, non-tender, non-distended EXTREMITIES: No edema; No deformity    ASSESSMENT AND PLAN: .    persistent AFib CHA2DS2Vasc is 4, on warfarin Tikosyn w/acceptable QTc Med list reviewed Teaching re-enforced Labs today   VHD  Mechanical AVR Mod MR (2022) 2 jets Update echo  Chronic CHF (diastolic) No symptoms  or exam findings of volume OL  Secondary hypercoagulable state 2/2 AFib   Discussed importance of keeping up with general cardiology given her VHD, as discussed above she inquires about changing to Dr. Excell Seltzer, having had a long history with him via his care for her husband. I will reach out to Drs. Skains and Cooper Addend; Dr. Excell Seltzer is happy to see her, pending her echo for timing of f/u with  him.    Dispo: 14mo again with EP, sooner if needed  Signed, Sheilah Pigeon, PA-C

## 2023-10-03 ENCOUNTER — Other Ambulatory Visit: Payer: Self-pay | Admitting: Cardiology

## 2023-10-08 ENCOUNTER — Other Ambulatory Visit: Payer: Self-pay | Admitting: Physician Assistant

## 2023-10-08 ENCOUNTER — Ambulatory Visit: Payer: Medicare HMO | Attending: Physician Assistant | Admitting: Physician Assistant

## 2023-10-08 ENCOUNTER — Encounter: Payer: Self-pay | Admitting: Physician Assistant

## 2023-10-08 VITALS — BP 162/60 | HR 68 | Ht 61.0 in | Wt 156.6 lb

## 2023-10-08 DIAGNOSIS — Z952 Presence of prosthetic heart valve: Secondary | ICD-10-CM

## 2023-10-08 DIAGNOSIS — I4819 Other persistent atrial fibrillation: Secondary | ICD-10-CM | POA: Diagnosis not present

## 2023-10-08 DIAGNOSIS — D6869 Other thrombophilia: Secondary | ICD-10-CM | POA: Diagnosis not present

## 2023-10-08 DIAGNOSIS — Z79899 Other long term (current) drug therapy: Secondary | ICD-10-CM | POA: Diagnosis not present

## 2023-10-08 DIAGNOSIS — Z09 Encounter for follow-up examination after completed treatment for conditions other than malignant neoplasm: Secondary | ICD-10-CM | POA: Diagnosis not present

## 2023-10-08 DIAGNOSIS — I059 Rheumatic mitral valve disease, unspecified: Secondary | ICD-10-CM | POA: Diagnosis not present

## 2023-10-08 DIAGNOSIS — I480 Paroxysmal atrial fibrillation: Secondary | ICD-10-CM | POA: Diagnosis not present

## 2023-10-08 DIAGNOSIS — Z5181 Encounter for therapeutic drug level monitoring: Secondary | ICD-10-CM | POA: Diagnosis not present

## 2023-10-08 NOTE — Patient Instructions (Addendum)
Medication Instructions:  Your physician recommends that you continue on your current medications as directed. Please refer to the Current Medication list given to you today.  *If you need a refill on your cardiac medications before your next appointment, please call your pharmacy*   Lab Work: BMET MAG AND CBC   If you have labs (blood work) drawn today and your tests are completely normal, you will receive your results only by: MyChart Message (if you have MyChart) OR A paper copy in the mail If you have any lab test that is abnormal or we need to change your treatment, we will call you to review the results.   Testing/Procedures: Your physician has requested that you have an echocardiogram. Echocardiography is a painless test that uses sound waves to create images of your heart. It provides your doctor with information about the size and shape of your heart and how well your heart's chambers and valves are working. This procedure takes approximately one hour. There are no restrictions for this procedure. Please do NOT wear cologne, perfume, aftershave, or lotions (deodorant is allowed). Please arrive 15 minutes prior to your appointment time.  Please note: We ask at that you not bring children with you during ultrasound (echo/ vascular) testing. Due to room size and safety concerns, children are not allowed in the ultrasound rooms during exams. Our front office staff cannot provide observation of children in our lobby area while testing is being conducted. An adult accompanying a patient to their appointment will only be allowed in the ultrasound room at the discretion of the ultrasound technician under special circumstances. We apologize for any inconvenience.     Follow-Up: At [facility], you and your health needs are our priority.  As part of our continuing mission to provide you with exceptional heart care, we have created designated Provider Care Teams.  These Care Teams  include your primary Cardiologist (physician) and Advanced Practice Providers (APPs -  Physician Assistants and Nurse Practitioners) who all work together to provide you with the care you need, when you need it.  We recommend signing up for the patient portal called "MyChart".  Sign up information is provided on this After Visit Summary.  MyChart is used to connect with patients for Virtual Visits (Telemedicine).  Patients are able to view lab/test results, encounter notes, upcoming appointments, etc.  Non-urgent messages can be sent to your provider as well.   To learn more about what you can do with MyChart, go to [email or web address].    Your next appointment:   3 month(s)  Provider:   You may see Lanier Prude, MD or one of the following Advanced Practice Providers on your designated Care Team:   Francis Dowse, [place]   Other Instructions

## 2023-10-09 LAB — MAGNESIUM: Magnesium: 2.3 mg/dL (ref 1.6–2.3)

## 2023-10-09 LAB — CBC
Hematocrit: 39.7 % (ref 34.0–46.6)
Hemoglobin: 12.3 g/dL (ref 11.1–15.9)
MCH: 28.7 pg (ref 26.6–33.0)
MCHC: 31 g/dL — ABNORMAL LOW (ref 31.5–35.7)
MCV: 93 fL (ref 79–97)
Platelets: 314 10*3/uL (ref 150–450)
RBC: 4.28 x10E6/uL (ref 3.77–5.28)
RDW: 13.1 % (ref 11.7–15.4)
WBC: 8.2 10*3/uL (ref 3.4–10.8)

## 2023-10-09 LAB — BASIC METABOLIC PANEL
BUN/Creatinine Ratio: 23 (ref 12–28)
BUN: 19 mg/dL (ref 8–27)
CO2: 24 mmol/L (ref 20–29)
Calcium: 9.3 mg/dL (ref 8.7–10.3)
Chloride: 107 mmol/L — ABNORMAL HIGH (ref 96–106)
Creatinine, Ser: 0.84 mg/dL (ref 0.57–1.00)
Glucose: 90 mg/dL (ref 70–99)
Potassium: 4.8 mmol/L (ref 3.5–5.2)
Sodium: 142 mmol/L (ref 134–144)
eGFR: 67 mL/min/{1.73_m2} (ref >59)

## 2023-10-25 DIAGNOSIS — Z0000 Encounter for general adult medical examination without abnormal findings: Secondary | ICD-10-CM | POA: Diagnosis not present

## 2023-10-25 DIAGNOSIS — I700 Atherosclerosis of aorta: Secondary | ICD-10-CM | POA: Diagnosis not present

## 2023-10-25 DIAGNOSIS — E039 Hypothyroidism, unspecified: Secondary | ICD-10-CM | POA: Diagnosis not present

## 2023-10-25 DIAGNOSIS — I5032 Chronic diastolic (congestive) heart failure: Secondary | ICD-10-CM | POA: Diagnosis not present

## 2023-10-25 DIAGNOSIS — D6869 Other thrombophilia: Secondary | ICD-10-CM | POA: Diagnosis not present

## 2023-10-25 DIAGNOSIS — I110 Hypertensive heart disease with heart failure: Secondary | ICD-10-CM | POA: Diagnosis not present

## 2023-10-25 DIAGNOSIS — Z1331 Encounter for screening for depression: Secondary | ICD-10-CM | POA: Diagnosis not present

## 2023-10-25 DIAGNOSIS — E559 Vitamin D deficiency, unspecified: Secondary | ICD-10-CM | POA: Diagnosis not present

## 2023-10-25 DIAGNOSIS — E7800 Pure hypercholesterolemia, unspecified: Secondary | ICD-10-CM | POA: Diagnosis not present

## 2023-10-25 DIAGNOSIS — I10 Essential (primary) hypertension: Secondary | ICD-10-CM | POA: Diagnosis not present

## 2023-10-25 DIAGNOSIS — Z23 Encounter for immunization: Secondary | ICD-10-CM | POA: Diagnosis not present

## 2023-11-08 ENCOUNTER — Ambulatory Visit: Payer: Medicare Other | Attending: Cardiology

## 2023-11-08 DIAGNOSIS — Z952 Presence of prosthetic heart valve: Secondary | ICD-10-CM

## 2023-11-08 DIAGNOSIS — Z5181 Encounter for therapeutic drug level monitoring: Secondary | ICD-10-CM

## 2023-11-08 LAB — POCT INR: INR: 2.3 (ref 2.0–3.0)

## 2023-11-08 NOTE — Patient Instructions (Signed)
 Description   Continue taking warfarin 1 tablet daily except 1.5 tablets on Sundays.  Stay consistent with greens each week Recheck INR in 7 weeks.  Coumadin Clinic [phone]

## 2023-11-19 ENCOUNTER — Ambulatory Visit (HOSPITAL_COMMUNITY): Payer: Medicare HMO | Attending: Physician Assistant

## 2023-11-19 DIAGNOSIS — I059 Rheumatic mitral valve disease, unspecified: Secondary | ICD-10-CM | POA: Diagnosis not present

## 2023-11-19 LAB — ECHOCARDIOGRAM COMPLETE
AV Mean grad: 14 mm[Hg]
AV Peak grad: 26.2 mm[Hg]
Ao pk vel: 2.56 m/s
Area-P 1/2: 3.65 cm2
MV M vel: 5.73 m/s
MV Peak grad: 131.3 mm[Hg]
Radius: 0.7 cm
S' Lateral: 2.8 cm

## 2023-11-22 ENCOUNTER — Telehealth: Payer: Self-pay | Admitting: Physician Assistant

## 2023-11-22 NOTE — Telephone Encounter (Signed)
Pt calling to request that Echo results be mailed to her. Please advise

## 2023-12-24 DIAGNOSIS — H43393 Other vitreous opacities, bilateral: Secondary | ICD-10-CM | POA: Diagnosis not present

## 2023-12-24 DIAGNOSIS — H40033 Anatomical narrow angle, bilateral: Secondary | ICD-10-CM | POA: Diagnosis not present

## 2023-12-27 ENCOUNTER — Ambulatory Visit: Payer: Medicare Other

## 2024-01-03 ENCOUNTER — Ambulatory Visit: Payer: Medicare Other | Attending: Cardiology

## 2024-01-03 DIAGNOSIS — Z952 Presence of prosthetic heart valve: Secondary | ICD-10-CM

## 2024-01-03 DIAGNOSIS — Z5181 Encounter for therapeutic drug level monitoring: Secondary | ICD-10-CM | POA: Diagnosis not present

## 2024-01-03 LAB — POCT INR: INR: 2.4 (ref 2.0–3.0)

## 2024-01-03 NOTE — Patient Instructions (Signed)
 Description   Continue taking warfarin 1 tablet daily except 1.5 tablets on Sundays.  Stay consistent with greens each week Recheck INR in 7 weeks.  Coumadin Clinic [phone]

## 2024-01-22 ENCOUNTER — Other Ambulatory Visit: Payer: Self-pay

## 2024-01-22 MED ORDER — DOFETILIDE 250 MCG PO CAPS: 250.0000 ug | ORAL_CAPSULE | Freq: Two times a day (BID) | ORAL | 0 refills | Status: DC

## 2024-01-22 MED ORDER — DILTIAZEM HCL ER 120 MG PO CP12: 120.0000 mg | ORAL_CAPSULE | Freq: Two times a day (BID) | ORAL | 0 refills | Status: DC

## 2024-01-30 NOTE — Progress Notes (Unsigned)
  Electrophysiology Office Note:   Date:  01/31/2024  ID:  Tracy Vargas, DOB 1934/06/06, MRN [id number]  Primary Cardiologist: Donato Schultz, MD Electrophysiologist: Lanier Prude, MD      History of Present Illness:   Tracy Vargas is a 89 y.o. female with h/o VHD w/mechanical AVR, AFib, HTN, HLD, chronic CHF (diastolic) seen today for routine electrophysiology followup.   Since last being seen in our clinic the patient reports doing well. Overall, she denies chest pain, palpitations, dyspnea, PND, orthopnea, nausea, vomiting, dizziness, syncope, edema, weight gain, or early satiety.   Review of systems complete and found to be negative unless listed in HPI.   EP Information / Studies Reviewed:    EKG is ordered today. Personal review as below.  EKG Interpretation Date/Time:  Thursday January 31 2024 11:01:30 EDT Ventricular Rate:  68 PR Interval:  176 QRS Duration:  104 QT Interval:  458 QTC Calculation: 487 R Axis:   -9  Text Interpretation: Sinus rhythm with occasional Premature ventricular complexes When compared with ECG of 08-Oct-2023 10:33, Premature ventricular complexes are now Present PR interval has decreased Criteria for Septal infarct are no longer Present Confirmed by Maxine Glenn [phone]) on 01/31/2024 11:11:21 AM    Arrhythmia/Device History No specialty comments available.   Physical Exam:   VS:  BP (!) 146/80 (BP Location: Left Arm, Patient Position: Sitting, Cuff Size: Normal)   Pulse 69   Resp 16   Ht 5\' 1"  (1.549 m)   Wt 154 lb 12.8 oz (70.2 kg)   SpO2 98%   BMI 29.25 kg/m    Wt Readings from Last 3 Encounters:  01/31/24 154 lb 12.8 oz (70.2 kg)  10/08/23 156 lb 9.6 oz (71 kg)  06/07/23 153 lb 12.8 oz (69.8 kg)     GEN: No acute distress NECK: No JVD; No carotid bruits CARDIAC: Regular rate and rhythm, no murmurs, rubs, gallops RESPIRATORY:  Clear to auscultation without rales, wheezing or rhonchi  ABDOMEN: Soft, non-tender,  non-distended EXTREMITIES:  No edema; No deformity   ASSESSMENT AND PLAN:    Persistent AF EKG today shows NSR with stable intervals Continue tikosyn 250 mcg BID Continue coumadin for CHA2DS2/VASc of at least 5  VHD H/o Mechanical AVR Severe MR, Mild MS on echo 11/2023 Reviewed with pt and Dr. Excell Seltzer. No current symptoms; Will plan conservative management    Chronic diastolic CHF Volume status stable on exam.   Secondary hypercoagulable state Pt on Coumadin as above    Follow up with EP APP in 4 months.  Signed, Graciella Freer, PA-C

## 2024-01-31 ENCOUNTER — Encounter: Payer: Self-pay | Admitting: Student

## 2024-01-31 ENCOUNTER — Ambulatory Visit: Payer: Medicare HMO | Attending: Student | Admitting: Student

## 2024-01-31 VITALS — BP 146/80 | HR 69 | Resp 16 | Ht 61.0 in | Wt 154.8 lb

## 2024-01-31 DIAGNOSIS — Z5181 Encounter for therapeutic drug level monitoring: Secondary | ICD-10-CM | POA: Diagnosis not present

## 2024-01-31 DIAGNOSIS — D6869 Other thrombophilia: Secondary | ICD-10-CM | POA: Diagnosis not present

## 2024-01-31 DIAGNOSIS — I5032 Chronic diastolic (congestive) heart failure: Secondary | ICD-10-CM | POA: Diagnosis not present

## 2024-01-31 DIAGNOSIS — Z952 Presence of prosthetic heart valve: Secondary | ICD-10-CM | POA: Diagnosis not present

## 2024-01-31 DIAGNOSIS — I4819 Other persistent atrial fibrillation: Secondary | ICD-10-CM

## 2024-01-31 NOTE — Patient Instructions (Signed)
 Medication Instructions:  Your physician recommends that you continue on your current medications as directed. Please refer to the Current Medication list given to you today.  *If you need a refill on your cardiac medications before your next appointment, please call your pharmacy*  Lab Work: BMET, MAG-TODAY If you have labs (blood work) drawn today and your tests are completely normal, you will receive your results only by: MyChart Message (if you have MyChart) OR A paper copy in the mail If you have any lab test that is abnormal or we need to change your treatment, we will call you to review the results.  Follow-Up: At [facility], you and your health needs are our priority.  As part of our continuing mission to provide you with exceptional heart care, we have created designated Provider Care Teams.  These Care Teams include your primary Cardiologist (physician) and Advanced Practice Providers (APPs -  Physician Assistants and Nurse Practitioners) who all work together to provide you with the care you need, when you need it.  Your next appointment:   4 month(s)  Provider:   Casimiro Needle "Otilio Saber, PA-C

## 2024-02-01 ENCOUNTER — Telehealth: Payer: Self-pay

## 2024-02-01 DIAGNOSIS — Z79899 Other long term (current) drug therapy: Secondary | ICD-10-CM

## 2024-02-01 LAB — BASIC METABOLIC PANEL WITH GFR
BUN/Creatinine Ratio: 26 (ref 12–28)
BUN: 21 mg/dL (ref 8–27)
CO2: 22 mmol/L (ref 20–29)
Calcium: 9.3 mg/dL (ref 8.7–10.3)
Chloride: 106 mmol/L (ref 96–106)
Creatinine, Ser: 0.82 mg/dL (ref 0.57–1.00)
Glucose: 83 mg/dL (ref 70–99)
Potassium: 5.6 mmol/L — ABNORMAL HIGH (ref 3.5–5.2)
Sodium: 142 mmol/L (ref 134–144)
eGFR: 69 mL/min/{1.73_m2} (ref >59)

## 2024-02-01 LAB — MAGNESIUM: Magnesium: 2.4 mg/dL — ABNORMAL HIGH (ref 1.6–2.3)

## 2024-02-01 MED ORDER — VALSARTAN 80 MG PO TABS: 80.0000 mg | ORAL_TABLET | Freq: Every day | ORAL | 3 refills | Status: DC

## 2024-02-01 NOTE — Telephone Encounter (Signed)
 Spoke with pt regarding her lab results. Pt was told to hold Valsartan for 2 day and then start taking 80 mg once daily. A new script was sent for Valsartan 80 mg once daily to pt's pharmacy of choice. Pt told to go to any [facility] for a BMET in 1 week. Pt expressed that this would be very difficult for her as she does not drive and has very few people to take her. Pt is wondering if she can just stop taking her potassium supplement. Pt was told this question would be forwarded to Alejandro Mulling, PA-C. A BMET was ordered and released. Pt was advised to hold her potassium supplement for now. Pt was told to monitor her blood pressure 1 hour after taking her morning medications and call us with the readings in one week. Pt verbalized understanding. All questions, if any, were answered.

## 2024-02-01 NOTE — Telephone Encounter (Signed)
-----   Message from Mariam Dollar Tillery sent at 02/01/2024  2:04 PM EDT ----- K is elevated.   Can we please HOLD valsartan x 2 days, then decrease to 80 mg daily with repeat BMET next week?  Would recommend she monitor her BP at home during this time to see if we need another agent.

## 2024-02-04 ENCOUNTER — Other Ambulatory Visit: Payer: Self-pay

## 2024-02-04 MED ORDER — DOFETILIDE 250 MCG PO CAPS: 250.0000 ug | ORAL_CAPSULE | Freq: Two times a day (BID) | ORAL | 3 refills | Status: DC

## 2024-02-04 NOTE — Telephone Encounter (Signed)
 Spoke with pt and advised pt she must hold K+ supplement.  Pt states she was confused about supplements and does not take K+ but rather Mg.  Reviewed pt's instructions again re: holding Valsartan 160mg  and starting Valsartan 80mg .  BMET in one week.  Pt verbalizes understanding and agrees with current plan.

## 2024-02-05 ENCOUNTER — Other Ambulatory Visit: Payer: Self-pay | Admitting: Cardiology

## 2024-02-05 MED ORDER — DOFETILIDE 250 MCG PO CAPS: 250.0000 ug | ORAL_CAPSULE | Freq: Two times a day (BID) | ORAL | 3 refills | Status: DC

## 2024-02-05 NOTE — Addendum Note (Signed)
 Addended by: Adriana Simas, Konstantina Nachreiner L on: 02/05/2024 02:04 PM   Modules accepted: Orders

## 2024-02-06 ENCOUNTER — Telehealth: Payer: Self-pay | Admitting: Student

## 2024-02-06 NOTE — Telephone Encounter (Addendum)
 Spoke with patient and she called to give BP readings  No BP meds 03/29 126/67 hr 73 03/31 136/67 hr 60 04/01 136/59 hr 68  She started valsartan last nigh and BP this morning 159/80 hr 66 she took it again at 11:27 152/84 hr 64

## 2024-02-06 NOTE — Telephone Encounter (Signed)
 Error

## 2024-02-06 NOTE — Telephone Encounter (Signed)
 Spoke with patient and she will continue to monitor BP.

## 2024-02-06 NOTE — Telephone Encounter (Signed)
 Pt calling to speak with only a nurse to give bp readings and lab work

## 2024-02-12 ENCOUNTER — Other Ambulatory Visit: Payer: Self-pay | Admitting: *Deleted

## 2024-02-12 DIAGNOSIS — Z952 Presence of prosthetic heart valve: Secondary | ICD-10-CM

## 2024-02-12 DIAGNOSIS — Z79899 Other long term (current) drug therapy: Secondary | ICD-10-CM

## 2024-02-12 DIAGNOSIS — Z5181 Encounter for therapeutic drug level monitoring: Secondary | ICD-10-CM

## 2024-02-13 LAB — BASIC METABOLIC PANEL WITH GFR
BUN/Creatinine Ratio: 23 (ref 12–28)
BUN: 19 mg/dL (ref 8–27)
CO2: 22 mmol/L (ref 20–29)
Calcium: 9.6 mg/dL (ref 8.7–10.3)
Chloride: 105 mmol/L (ref 96–106)
Creatinine, Ser: 0.83 mg/dL (ref 0.57–1.00)
Glucose: 91 mg/dL (ref 70–99)
Potassium: 5.5 mmol/L — ABNORMAL HIGH (ref 3.5–5.2)
Sodium: 142 mmol/L (ref 134–144)
eGFR: 68 mL/min/{1.73_m2} (ref >59)

## 2024-02-21 ENCOUNTER — Ambulatory Visit: Payer: Medicare Other | Attending: Cardiology

## 2024-02-21 DIAGNOSIS — Z952 Presence of prosthetic heart valve: Secondary | ICD-10-CM

## 2024-02-21 DIAGNOSIS — Z5181 Encounter for therapeutic drug level monitoring: Secondary | ICD-10-CM

## 2024-02-21 LAB — POCT INR: INR: 2.9 (ref 2.0–3.0)

## 2024-02-21 NOTE — Patient Instructions (Signed)
 Description   Only take 1/2 tablet today and then continue taking warfarin 1 tablet daily except 1.5 tablets on Sundays.  Stay consistent with greens each week Recheck INR in 6 weeks.  Coumadin Clinic [phone]

## 2024-03-12 ENCOUNTER — Other Ambulatory Visit: Payer: Self-pay | Admitting: Cardiology

## 2024-03-12 ENCOUNTER — Telehealth: Payer: Self-pay | Admitting: Cardiology

## 2024-03-12 MED ORDER — DILTIAZEM HCL ER 120 MG PO CP12: 120.0000 mg | ORAL_CAPSULE | Freq: Two times a day (BID) | ORAL | 3 refills | Status: DC

## 2024-03-12 NOTE — Telephone Encounter (Signed)
 Pt's medication was sent to pt's pharmacy as requested. Confirmation received.

## 2024-03-12 NOTE — Telephone Encounter (Signed)
 Pt is requesting a refill on medication diltiazem . This medication has been refilled by [name]. Pt would like [name] to start refilling this medication for her. Would [name] like to refill this medication or does [name] require pt to see [name] for a refill? Please address

## 2024-03-12 NOTE — Telephone Encounter (Signed)
*  STAT* If patient is at the pharmacy, call can be transferred to refill team.   1. Which medications need to be refilled? (please list name of each medication and dose if known) diltiazem  (CARDIZEM  SR) 120 MG 12 hr capsule    4. Which pharmacy/location (including street and city if local pharmacy) is medication to be sent to?  [facility] - [place], [place] - [address]     5. Do they need a 30 day or 90 day supply? 90   Pt states she does not see [name] anymore and she would like this rx changed to reflect that [name] will fill. Please advise.

## 2024-03-28 DIAGNOSIS — I4891 Unspecified atrial fibrillation: Secondary | ICD-10-CM | POA: Diagnosis not present

## 2024-03-28 DIAGNOSIS — I5032 Chronic diastolic (congestive) heart failure: Secondary | ICD-10-CM | POA: Diagnosis not present

## 2024-03-28 DIAGNOSIS — I4819 Other persistent atrial fibrillation: Secondary | ICD-10-CM | POA: Diagnosis not present

## 2024-03-28 DIAGNOSIS — I10 Essential (primary) hypertension: Secondary | ICD-10-CM | POA: Diagnosis not present

## 2024-04-02 NOTE — Telephone Encounter (Signed)
 Erroneous encounter

## 2024-04-03 ENCOUNTER — Ambulatory Visit: Attending: Internal Medicine

## 2024-04-03 DIAGNOSIS — Z952 Presence of prosthetic heart valve: Secondary | ICD-10-CM

## 2024-04-03 LAB — POCT INR: INR: 2.4 (ref 2.0–3.0)

## 2024-04-03 NOTE — Patient Instructions (Signed)
 Description   Continue taking warfarin 1 tablet daily except 1.5 tablets on Sundays.  Stay consistent with greens each week Recheck INR in 7 weeks.  Coumadin Clinic [phone]

## 2024-04-05 DIAGNOSIS — I4819 Other persistent atrial fibrillation: Secondary | ICD-10-CM | POA: Diagnosis not present

## 2024-04-05 DIAGNOSIS — E039 Hypothyroidism, unspecified: Secondary | ICD-10-CM | POA: Diagnosis not present

## 2024-04-05 DIAGNOSIS — I10 Essential (primary) hypertension: Secondary | ICD-10-CM | POA: Diagnosis not present

## 2024-04-05 DIAGNOSIS — I4891 Unspecified atrial fibrillation: Secondary | ICD-10-CM | POA: Diagnosis not present

## 2024-04-05 DIAGNOSIS — E7800 Pure hypercholesterolemia, unspecified: Secondary | ICD-10-CM | POA: Diagnosis not present

## 2024-04-05 DIAGNOSIS — I5032 Chronic diastolic (congestive) heart failure: Secondary | ICD-10-CM | POA: Diagnosis not present

## 2024-04-24 DIAGNOSIS — I4819 Other persistent atrial fibrillation: Secondary | ICD-10-CM | POA: Diagnosis not present

## 2024-04-24 DIAGNOSIS — E559 Vitamin D deficiency, unspecified: Secondary | ICD-10-CM | POA: Diagnosis not present

## 2024-04-24 DIAGNOSIS — I10 Essential (primary) hypertension: Secondary | ICD-10-CM | POA: Diagnosis not present

## 2024-04-24 DIAGNOSIS — M25462 Effusion, left knee: Secondary | ICD-10-CM | POA: Diagnosis not present

## 2024-04-24 DIAGNOSIS — D6869 Other thrombophilia: Secondary | ICD-10-CM | POA: Diagnosis not present

## 2024-04-24 DIAGNOSIS — E7800 Pure hypercholesterolemia, unspecified: Secondary | ICD-10-CM | POA: Diagnosis not present

## 2024-04-24 DIAGNOSIS — Z952 Presence of prosthetic heart valve: Secondary | ICD-10-CM | POA: Diagnosis not present

## 2024-04-24 DIAGNOSIS — E039 Hypothyroidism, unspecified: Secondary | ICD-10-CM | POA: Diagnosis not present

## 2024-04-24 DIAGNOSIS — N3281 Overactive bladder: Secondary | ICD-10-CM | POA: Diagnosis not present

## 2024-04-24 DIAGNOSIS — I5032 Chronic diastolic (congestive) heart failure: Secondary | ICD-10-CM | POA: Diagnosis not present

## 2024-04-26 DIAGNOSIS — I4819 Other persistent atrial fibrillation: Secondary | ICD-10-CM | POA: Diagnosis not present

## 2024-04-26 DIAGNOSIS — I4891 Unspecified atrial fibrillation: Secondary | ICD-10-CM | POA: Diagnosis not present

## 2024-04-26 DIAGNOSIS — I5032 Chronic diastolic (congestive) heart failure: Secondary | ICD-10-CM | POA: Diagnosis not present

## 2024-04-26 DIAGNOSIS — I10 Essential (primary) hypertension: Secondary | ICD-10-CM | POA: Diagnosis not present

## 2024-05-05 DIAGNOSIS — I10 Essential (primary) hypertension: Secondary | ICD-10-CM | POA: Diagnosis not present

## 2024-05-05 DIAGNOSIS — E7800 Pure hypercholesterolemia, unspecified: Secondary | ICD-10-CM | POA: Diagnosis not present

## 2024-05-05 DIAGNOSIS — I5032 Chronic diastolic (congestive) heart failure: Secondary | ICD-10-CM | POA: Diagnosis not present

## 2024-05-05 DIAGNOSIS — I4891 Unspecified atrial fibrillation: Secondary | ICD-10-CM | POA: Diagnosis not present

## 2024-05-05 DIAGNOSIS — E039 Hypothyroidism, unspecified: Secondary | ICD-10-CM | POA: Diagnosis not present

## 2024-05-05 DIAGNOSIS — I4819 Other persistent atrial fibrillation: Secondary | ICD-10-CM | POA: Diagnosis not present

## 2024-05-07 DIAGNOSIS — M25562 Pain in left knee: Secondary | ICD-10-CM | POA: Diagnosis not present

## 2024-05-14 DIAGNOSIS — M25562 Pain in left knee: Secondary | ICD-10-CM | POA: Diagnosis not present

## 2024-05-15 DIAGNOSIS — H348322 Tributary (branch) retinal vein occlusion, left eye, stable: Secondary | ICD-10-CM | POA: Diagnosis not present

## 2024-05-15 DIAGNOSIS — H04123 Dry eye syndrome of bilateral lacrimal glands: Secondary | ICD-10-CM | POA: Diagnosis not present

## 2024-05-15 DIAGNOSIS — Z961 Presence of intraocular lens: Secondary | ICD-10-CM | POA: Diagnosis not present

## 2024-05-15 DIAGNOSIS — H43813 Vitreous degeneration, bilateral: Secondary | ICD-10-CM | POA: Diagnosis not present

## 2024-05-15 DIAGNOSIS — H52203 Unspecified astigmatism, bilateral: Secondary | ICD-10-CM | POA: Diagnosis not present

## 2024-05-15 DIAGNOSIS — H524 Presbyopia: Secondary | ICD-10-CM | POA: Diagnosis not present

## 2024-05-20 NOTE — Progress Notes (Unsigned)
  Electrophysiology Office Note:   Date:  05/22/2024  ID:  Tracy Vargas, DOB 1934/10/29, MRN [id number]  Primary Cardiologist: Oneil Parchment, MD Electrophysiologist: OLE ONEIDA HOLTS, MD      History of Present Illness:   Tracy Vargas is a 89 y.o. female with h/o VHD w/mechanical AVR, AFib, HTN, HLD, chronic CHF (diastolic) seen today for routine electrophysiology followup.   Since last being seen in our clinic the patient reports doing very well. No breakthrough AF of which she is aware. Mild dyspnea with moderate exertion like scrubbing the kitchen floors. Otherwise ADLs are OK. She denies chest pain, palpitations, PND, orthopnea, nausea, vomiting, dizziness, syncope, edema, weight gain, or early satiety.   Review of systems complete and found to be negative unless listed in HPI.   EP Information / Studies Reviewed:    EKG is ordered today. Personal review as below.  EKG Interpretation Date/Time:  Thursday May 22 2024 10:24:48 EDT Ventricular Rate:  64 PR Interval:  178 QRS Duration:  106 QT Interval:  470 QTC Calculation: 485 R Axis:   -1  Text Interpretation: Sinus rhythm with occasional Premature ventricular complexes Incomplete left bundle branch block Borderline QT When compared with ECG of 31-Jan-2024 11:01, No significant change was found Confirmed by Lesia Sharper [phone]) on 05/22/2024 10:40:18 AM    Arrhythmia/Device History No specialty comments available.   Physical Exam:   VS:  BP 124/72   Pulse 64   Ht 5' 1 (1.549 m)   Wt 154 lb 14.4 oz (70.3 kg)   SpO2 98%   BMI 29.27 kg/m    Wt Readings from Last 3 Encounters:  05/22/24 154 lb 14.4 oz (70.3 kg)  01/31/24 154 lb 12.8 oz (70.2 kg)  10/08/23 156 lb 9.6 oz (71 kg)     GEN: No acute distress NECK: No JVD; No carotid bruits CARDIAC: Regular rate and rhythm, no murmurs, rubs, gallops RESPIRATORY:  Clear to auscultation without rales, wheezing or rhonchi  ABDOMEN: Soft, non-tender,  non-distended EXTREMITIES:  No edema; No deformity   ASSESSMENT AND PLAN:    Persistent AF EKG today shows NSR with stable intervals when measured manually.  Continue tikosyn  250 mcg BID Continue coumadin  for CHA2DS2/VASc of at least 5 Labs today   VHD H/o Mechanical AVR Severe MR, Mild MS on echo 11/2023 Previously reviewed with pt and Dr. Wonda. No current symptoms; Will plan conservative management    Chronic diastolic CHF Volume status stable on exam.    Secondary hypercoagulable state Pt on Coumadin  as above   Follow up with EP Team in 4 months.   Signed, Sharper Prentice Lesia, PA-C

## 2024-05-22 ENCOUNTER — Encounter: Payer: Self-pay | Admitting: Student

## 2024-05-22 ENCOUNTER — Ambulatory Visit

## 2024-05-22 ENCOUNTER — Ambulatory Visit: Attending: Student | Admitting: Student

## 2024-05-22 VITALS — BP 124/72 | HR 64 | Ht 61.0 in | Wt 154.9 lb

## 2024-05-22 DIAGNOSIS — I5032 Chronic diastolic (congestive) heart failure: Secondary | ICD-10-CM

## 2024-05-22 DIAGNOSIS — Z5181 Encounter for therapeutic drug level monitoring: Secondary | ICD-10-CM | POA: Diagnosis not present

## 2024-05-22 DIAGNOSIS — Z952 Presence of prosthetic heart valve: Secondary | ICD-10-CM | POA: Diagnosis not present

## 2024-05-22 DIAGNOSIS — I4819 Other persistent atrial fibrillation: Secondary | ICD-10-CM

## 2024-05-22 DIAGNOSIS — D6869 Other thrombophilia: Secondary | ICD-10-CM

## 2024-05-22 LAB — POCT INR: INR: 2.6 (ref 2.0–3.0)

## 2024-05-22 NOTE — Patient Instructions (Signed)
 Description   Only take 1/2 tablet tomorrow (already taken today's dose) and then continue taking warfarin 1 tablet daily except 1.5 tablets on Sundays.  Stay consistent with greens each week Recheck INR in 6 weeks.  Coumadin  Clinic [phone]

## 2024-05-22 NOTE — Addendum Note (Signed)
 Addended by: JOESPH CHROMAN B on: 05/22/2024 09:53 AM   Modules accepted: Level of Service

## 2024-05-22 NOTE — Progress Notes (Signed)
Please see anticoagulation encounter.

## 2024-05-22 NOTE — Patient Instructions (Signed)
 Medication Instructions:  Your physician recommends that you continue on your current medications as directed. Please refer to the Current Medication list given to you today.  *If you need a refill on your cardiac medications before your next appointment, please call your pharmacy*  Lab Work: TODAY-BMET, MAG If you have labs (blood work) drawn today and your tests are completely normal, you will receive your results only by: MyChart Message (if you have MyChart) OR A paper copy in the mail If you have any lab test that is abnormal or we need to change your treatment, we will call you to review the results.  Follow-Up: At [facility], you and your health needs are our priority.  As part of our continuing mission to provide you with exceptional heart care, our providers are all part of one team.  This team includes your primary Cardiologist (physician) and Advanced Practice Providers or APPs (Physician Assistants and Nurse Practitioners) who all work together to provide you with the care you need, when you need it.  Your next appointment:   4 month(s)  Provider:   You may see OLE ONEIDA HOLTS, MD or one of the following Advanced Practice Providers on your designated Care Team:   Charlies Arthur, [place] Ozell Jodie Passey, PA-C Suzann Riddle, NP Daphne Barrack, NP

## 2024-05-23 ENCOUNTER — Ambulatory Visit: Payer: Self-pay | Admitting: Student

## 2024-05-23 LAB — BASIC METABOLIC PANEL WITH GFR
BUN/Creatinine Ratio: 24 (ref 12–28)
BUN: 20 mg/dL (ref 8–27)
CO2: 21 mmol/L (ref 20–29)
Calcium: 9.3 mg/dL (ref 8.7–10.3)
Chloride: 104 mmol/L (ref 96–106)
Creatinine, Ser: 0.83 mg/dL (ref 0.57–1.00)
Glucose: 90 mg/dL (ref 70–99)
Potassium: 4.6 mmol/L (ref 3.5–5.2)
Sodium: 141 mmol/L (ref 134–144)
eGFR: 68 mL/min/1.73 (ref >59)

## 2024-05-23 LAB — MAGNESIUM: Magnesium: 2.4 mg/dL — ABNORMAL HIGH (ref 1.6–2.3)

## 2024-05-26 DIAGNOSIS — I4819 Other persistent atrial fibrillation: Secondary | ICD-10-CM | POA: Diagnosis not present

## 2024-05-26 DIAGNOSIS — I5032 Chronic diastolic (congestive) heart failure: Secondary | ICD-10-CM | POA: Diagnosis not present

## 2024-05-26 DIAGNOSIS — I4891 Unspecified atrial fibrillation: Secondary | ICD-10-CM | POA: Diagnosis not present

## 2024-05-26 DIAGNOSIS — I10 Essential (primary) hypertension: Secondary | ICD-10-CM | POA: Diagnosis not present

## 2024-06-05 DIAGNOSIS — I5032 Chronic diastolic (congestive) heart failure: Secondary | ICD-10-CM | POA: Diagnosis not present

## 2024-06-05 DIAGNOSIS — I4819 Other persistent atrial fibrillation: Secondary | ICD-10-CM | POA: Diagnosis not present

## 2024-06-05 DIAGNOSIS — E039 Hypothyroidism, unspecified: Secondary | ICD-10-CM | POA: Diagnosis not present

## 2024-06-05 DIAGNOSIS — I10 Essential (primary) hypertension: Secondary | ICD-10-CM | POA: Diagnosis not present

## 2024-06-05 DIAGNOSIS — E7800 Pure hypercholesterolemia, unspecified: Secondary | ICD-10-CM | POA: Diagnosis not present

## 2024-06-05 DIAGNOSIS — I4891 Unspecified atrial fibrillation: Secondary | ICD-10-CM | POA: Diagnosis not present

## 2024-06-25 DIAGNOSIS — I4891 Unspecified atrial fibrillation: Secondary | ICD-10-CM | POA: Diagnosis not present

## 2024-06-25 DIAGNOSIS — I4819 Other persistent atrial fibrillation: Secondary | ICD-10-CM | POA: Diagnosis not present

## 2024-06-25 DIAGNOSIS — I10 Essential (primary) hypertension: Secondary | ICD-10-CM | POA: Diagnosis not present

## 2024-06-25 DIAGNOSIS — I5032 Chronic diastolic (congestive) heart failure: Secondary | ICD-10-CM | POA: Diagnosis not present

## 2024-07-02 ENCOUNTER — Ambulatory Visit

## 2024-07-06 DIAGNOSIS — E039 Hypothyroidism, unspecified: Secondary | ICD-10-CM | POA: Diagnosis not present

## 2024-07-06 DIAGNOSIS — E7800 Pure hypercholesterolemia, unspecified: Secondary | ICD-10-CM | POA: Diagnosis not present

## 2024-07-06 DIAGNOSIS — I5032 Chronic diastolic (congestive) heart failure: Secondary | ICD-10-CM | POA: Diagnosis not present

## 2024-07-06 DIAGNOSIS — I10 Essential (primary) hypertension: Secondary | ICD-10-CM | POA: Diagnosis not present

## 2024-07-06 DIAGNOSIS — I4891 Unspecified atrial fibrillation: Secondary | ICD-10-CM | POA: Diagnosis not present

## 2024-07-06 DIAGNOSIS — I4819 Other persistent atrial fibrillation: Secondary | ICD-10-CM | POA: Diagnosis not present

## 2024-07-09 ENCOUNTER — Ambulatory Visit: Attending: Cardiology

## 2024-07-09 DIAGNOSIS — Z952 Presence of prosthetic heart valve: Secondary | ICD-10-CM

## 2024-07-09 DIAGNOSIS — Z5181 Encounter for therapeutic drug level monitoring: Secondary | ICD-10-CM | POA: Diagnosis not present

## 2024-07-09 LAB — POCT INR: INR: 2.9 (ref 2.0–3.0)

## 2024-07-09 NOTE — Patient Instructions (Addendum)
    Description   INR 2.9, Only take 1/2 tablet tomorrow (already taken today's dose) and then continue taking warfarin 1 tablet daily except 1.5 tablets on Sundays.  Stay consistent with greens each week Recheck INR in 6 weeks (per pt request, aware of risk)  Coumadin  Clinic [phone]

## 2024-07-09 NOTE — Progress Notes (Signed)
 Description   INR 2.9, Only take 1/2 tablet tomorrow (already taken today's dose) and then continue taking warfarin 1 tablet daily except 1.5 tablets on Sundays.  Stay consistent with greens each week Recheck INR in 6 weeks (per pt request, aware of risk)  Coumadin  Clinic [phone]

## 2024-07-21 DIAGNOSIS — R21 Rash and other nonspecific skin eruption: Secondary | ICD-10-CM | POA: Diagnosis not present

## 2024-07-25 DIAGNOSIS — I4819 Other persistent atrial fibrillation: Secondary | ICD-10-CM | POA: Diagnosis not present

## 2024-07-25 DIAGNOSIS — I4891 Unspecified atrial fibrillation: Secondary | ICD-10-CM | POA: Diagnosis not present

## 2024-07-25 DIAGNOSIS — I5032 Chronic diastolic (congestive) heart failure: Secondary | ICD-10-CM | POA: Diagnosis not present

## 2024-07-25 DIAGNOSIS — I10 Essential (primary) hypertension: Secondary | ICD-10-CM | POA: Diagnosis not present

## 2024-08-05 DIAGNOSIS — E7800 Pure hypercholesterolemia, unspecified: Secondary | ICD-10-CM | POA: Diagnosis not present

## 2024-08-05 DIAGNOSIS — I4891 Unspecified atrial fibrillation: Secondary | ICD-10-CM | POA: Diagnosis not present

## 2024-08-05 DIAGNOSIS — E039 Hypothyroidism, unspecified: Secondary | ICD-10-CM | POA: Diagnosis not present

## 2024-08-05 DIAGNOSIS — I10 Essential (primary) hypertension: Secondary | ICD-10-CM | POA: Diagnosis not present

## 2024-08-05 DIAGNOSIS — I4819 Other persistent atrial fibrillation: Secondary | ICD-10-CM | POA: Diagnosis not present

## 2024-08-05 DIAGNOSIS — I5032 Chronic diastolic (congestive) heart failure: Secondary | ICD-10-CM | POA: Diagnosis not present

## 2024-08-20 ENCOUNTER — Ambulatory Visit: Attending: Cardiology | Admitting: *Deleted

## 2024-08-20 DIAGNOSIS — Z5181 Encounter for therapeutic drug level monitoring: Secondary | ICD-10-CM

## 2024-08-20 DIAGNOSIS — Z952 Presence of prosthetic heart valve: Secondary | ICD-10-CM | POA: Diagnosis not present

## 2024-08-20 LAB — POCT INR: INR: 3.5 — AB (ref 2.0–3.0)

## 2024-08-20 NOTE — Patient Instructions (Signed)
 Description   INR-3.5; Since you have taken today's dose of warfarin, DO NOT TAKE ANY WARFARIN TOMORROW, then START taking warfarin 1 tablet daily eStay consistent with greens each week Recheck INR in 5 weeks (per pt request, aware of risk)  Coumadin  Clinic [phone]

## 2024-08-20 NOTE — Progress Notes (Signed)
 Description   INR-3.5; Since you have taken today's dose of warfarin, DO NOT TAKE ANY WARFARIN TOMORROW, then START taking warfarin 1 tablet daily eStay consistent with greens each week Recheck INR in 5 weeks (per pt request, aware of risk)  Coumadin  Clinic [phone]

## 2024-09-15 NOTE — Progress Notes (Signed)
 Electrophysiology Office Note:   Date:  09/24/2024  ID:  Tracy Vargas, DOB Sep 06, 1934, MRN [id number]  Primary Cardiologist: Oneil Parchment, MD Primary Heart Failure: None Electrophysiologist: OLE ONEIDA HOLTS, MD      History of Present Illness:   Tracy Vargas is a 89 y.o. female with h/o AF, HTN, HFpEF, VHD: MR, AV replacement, HLD seen today for routine electrophysiology followup.   Since last being seen in our clinic the patient reports doing well overall. She states she takes her Tikosyn  when she wakes up around 8-9am and after dinner at 5-6 pm. No missed doses. No bleeding issues on coumadin  > INR 1.7 on 11/19 visit with Coumadin  Clinic. She reports her BP is elevated as she gets excited coming to the MD office.  In addition, she recently stopped her BP medication and only takes Clonidine 0.1mg  PRN if BP elevated. She reports she took it this am.   She denies chest pain, palpitations, dyspnea, PND, orthopnea, nausea, vomiting, dizziness, syncope, edema, weight gain, or early satiety.   Review of systems complete and found to be negative unless listed in HPI.   EP Information / Studies Reviewed:    EKG is ordered today. Personal review as below.  EKG Interpretation Date/Time:  Wednesday September 24 2024 10:34:20 EST Ventricular Rate:  54 PR Interval:  196 QRS Duration:  106 QT Interval:  520 QTC Calculation: 493 R Axis:   15  Text Interpretation: Sinus bradycardia Nonspecific ST abnormality Borderline QT interval Confirmed by Aniceto Jarvis ([id number]) on 09/24/2024 10:46:10 AM   Arrhythmia / AAD / Pertinent EP Studies AF   Tikosyn  loading 12/2021 >    Risk Assessment/Calculations:    CHA2DS2-VASc Score = 4   This indicates a 4.8% annual risk of stroke. The patient's score is based upon: CHF History: 1 HTN History: 0 Diabetes History: 0 Stroke History: 0 Vascular Disease History: 0 Age Score: 2 Gender Score: 1    HYPERTENSION CONTROL Vitals:   09/24/24 1031 09/24/24  1040  BP: (!) 146/60 (!) 160/68    The patient's blood pressure is elevated above target today.  In order to address the patient's elevated BP: Blood pressure will be monitored at home to determine if medication changes need to be made.           Physical Exam:   VS:  BP (!) 160/68   Pulse (!) 54   Ht 5' 1 (1.549 m)   Wt 158 lb (71.7 kg)   SpO2 92%   BMI 29.85 kg/m    Wt Readings from Last 3 Encounters:  09/24/24 158 lb (71.7 kg)  05/22/24 154 lb 14.4 oz (70.3 kg)  01/31/24 154 lb 12.8 oz (70.2 kg)     GEN: Well nourished, well developed in no acute distress NECK: No JVD; No carotid bruits CARDIAC: Regular rate and rhythm, no murmurs, rubs, gallops RESPIRATORY:  Clear to auscultation without rales, wheezing or rhonchi  ABDOMEN: Soft, non-tender, non-distended EXTREMITIES:  No edema; No deformity   ASSESSMENT AND PLAN:    Persistent Atrial Fibrillation  High Risk Medication Monitoring: Tikosyn  CHA2DS2-VASc 4 -EKG with NSR, QTc 483 ms in V3 by Fridericia  -continue Tikosyn  250 mcg BID  -update Tikosyn  labs > BMP, Mg+ -OAC for stroke prophylaxis   Secondary Hypercoagulable State  -continue Coumadin , follows in the Coumadin  Clinic  VHD: Severe MR, Mechanical AVR  -coumadin  as above  -plan per Dr. Wonda > no current symptoms, conservative management.  She states Dr. Wonda  indicated he would take her as a pt and wants to switch from Dr. Jeffrie as Dr. Wonda was her husbands physician.   Chronic Diastolic HF  -euvolemic on exam     Hypertension  -encouraged pt to follow her BP trend and reach out to her PCP or Cardiology about her blood pressure if it remains consistently elevated   Follow up with EP APP 4 months  > transition to new EP MD at that time  Signed, Daphne Barrack, NP-C, AGACNP-BC [facility] - Electrophysiology  09/24/2024, 12:55 PM

## 2024-09-24 ENCOUNTER — Ambulatory Visit: Admitting: Pharmacist

## 2024-09-24 ENCOUNTER — Encounter: Payer: Self-pay | Admitting: Pulmonary Disease

## 2024-09-24 ENCOUNTER — Ambulatory Visit: Attending: Pulmonary Disease | Admitting: Pulmonary Disease

## 2024-09-24 VITALS — BP 160/68 | HR 54 | Ht 61.0 in | Wt 158.0 lb

## 2024-09-24 DIAGNOSIS — Z5181 Encounter for therapeutic drug level monitoring: Secondary | ICD-10-CM

## 2024-09-24 DIAGNOSIS — I059 Rheumatic mitral valve disease, unspecified: Secondary | ICD-10-CM

## 2024-09-24 DIAGNOSIS — I349 Nonrheumatic mitral valve disorder, unspecified: Secondary | ICD-10-CM

## 2024-09-24 DIAGNOSIS — I4819 Other persistent atrial fibrillation: Secondary | ICD-10-CM | POA: Diagnosis not present

## 2024-09-24 DIAGNOSIS — Z952 Presence of prosthetic heart valve: Secondary | ICD-10-CM

## 2024-09-24 DIAGNOSIS — Z79899 Other long term (current) drug therapy: Secondary | ICD-10-CM

## 2024-09-24 DIAGNOSIS — D6869 Other thrombophilia: Secondary | ICD-10-CM

## 2024-09-24 LAB — POCT INR: INR: 1.7 — AB (ref 2.0–3.0)

## 2024-09-24 NOTE — Patient Instructions (Signed)
 Description   INR-1.7; Take 1 and 1/2 tablets today and then restart taking 1 and 1/2 tablets on Sundays, 1 tablet all other days of the week  Recheck INR in 5 weeks (per pt request, aware of risk)  Coumadin  Clinic [phone]

## 2024-09-24 NOTE — Progress Notes (Signed)
 Description   INR-1.7; Take 1 and 1/2 tablets today and then restart taking 1 and 1/2 tablets on Sundays, 1 tablet all other days of the week  Recheck INR in 5 weeks (per pt request, aware of risk)  Coumadin  Clinic [phone]

## 2024-09-24 NOTE — Patient Instructions (Addendum)
 Medication Instructions:  No medications changes today   *If you need a refill on your cardiac medications before your next appointment, please call your pharmacy*  Lab Work: We will review a BMP and Magnesium  for your Tikosyn  monitoring If you have labs (blood work) drawn today and your tests are completely normal, you will receive your results only by: MyChart Message (if you have MyChart) OR A paper copy in the mail If you have any lab test that is abnormal or we need to change your treatment, we will call you to review the results.  Testing/Procedures: No testing/procedures were scheduled today  Follow-Up: At [facility], you and your health needs are our priority.  As part of our continuing mission to provide you with exceptional heart care, our providers are all part of one team.  This team includes your primary Cardiologist (physician) and Advanced Practice Providers or APPs (Physician Assistants and Nurse Practitioners) who all work together to provide you with the care you need, when you need it.  Your next appointment:   4 month(s)  Provider:   Daphne Barrack, NP    We recommend signing up for the patient portal called MyChart.  Sign up information is provided on this After Visit Summary.  MyChart is used to connect with patients for Virtual Visits (Telemedicine).  Patients are able to view lab/test results, encounter notes, upcoming appointments, etc.  Non-urgent messages can be sent to your provider as well.   To learn more about what you can do with MyChart, go to [email or web address].

## 2024-09-25 ENCOUNTER — Ambulatory Visit: Payer: Self-pay | Admitting: Pulmonary Disease

## 2024-09-25 LAB — BASIC METABOLIC PANEL WITH GFR
BUN/Creatinine Ratio: 20 (ref 12–28)
BUN: 18 mg/dL (ref 8–27)
CO2: 24 mmol/L (ref 20–29)
Calcium: 8.9 mg/dL (ref 8.7–10.3)
Chloride: 105 mmol/L (ref 96–106)
Creatinine, Ser: 0.89 mg/dL (ref 0.57–1.00)
Glucose: 92 mg/dL (ref 70–99)
Potassium: 4.8 mmol/L (ref 3.5–5.2)
Sodium: 140 mmol/L (ref 134–144)
eGFR: 62 mL/min/1.73 (ref >59)

## 2024-09-25 LAB — MAGNESIUM: Magnesium: 2.3 mg/dL (ref 1.6–2.3)

## 2024-11-05 ENCOUNTER — Ambulatory Visit: Attending: Cardiology | Admitting: *Deleted

## 2024-11-05 DIAGNOSIS — Z952 Presence of prosthetic heart valve: Secondary | ICD-10-CM | POA: Diagnosis not present

## 2024-11-05 DIAGNOSIS — I482 Chronic atrial fibrillation, unspecified: Secondary | ICD-10-CM

## 2024-11-05 DIAGNOSIS — Z5181 Encounter for therapeutic drug level monitoring: Secondary | ICD-10-CM | POA: Diagnosis not present

## 2024-11-05 LAB — POCT INR: INR: 2.6 (ref 2.0–3.0)

## 2024-11-05 NOTE — Progress Notes (Signed)
 Description   INR-2.6; Tomorrow take 1/2 tablet of warfarin then continue taking 1 tablet daily except 1 and 1/2 tablets on Sundays. Recheck INR in 5 weeks.  Coumadin  Clinic [phone]

## 2024-11-05 NOTE — Patient Instructions (Signed)
 Description   INR-2.6; Tomorrow take 1/2 tablet of warfarin then continue taking 1 tablet daily except 1 and 1/2 tablets on Sundays. Recheck INR in 5 weeks.  Coumadin  Clinic [phone]

## 2024-11-14 ENCOUNTER — Telehealth: Payer: Self-pay | Admitting: Cardiology

## 2024-11-14 MED ORDER — DOFETILIDE 250 MCG PO CAPS: 250.0000 ug | ORAL_CAPSULE | Freq: Two times a day (BID) | ORAL | 3 refills | Status: AC

## 2024-11-14 MED ORDER — DILTIAZEM HCL ER 120 MG PO CP12: 120.0000 mg | ORAL_CAPSULE | Freq: Two times a day (BID) | ORAL | 3 refills | Status: AC

## 2024-11-14 NOTE — Telephone Encounter (Signed)
" °*  STAT* If patient is at the pharmacy, call can be transferred to refill team.   1. Which medications need to be refilled? (please list name of each medication and dose if known) diltiazem  (CARDIZEM  SR) 120 MG 12 hr capsule   dofetilide  (TIKOSYN ) 250 MCG capsule   2. Which pharmacy/location (including street and city if local pharmacy) is medication to be sent to? [id number] - [place] ([address]), [place] - [address]   3. Do they need a 30 day or 90 day supply? 30  "

## 2024-11-14 NOTE — Telephone Encounter (Signed)
 RX sent in.

## 2024-11-28 ENCOUNTER — Ambulatory Visit: Admitting: Cardiology

## 2024-12-04 ENCOUNTER — Other Ambulatory Visit: Payer: Self-pay | Admitting: Cardiology

## 2024-12-04 DIAGNOSIS — I4819 Other persistent atrial fibrillation: Secondary | ICD-10-CM

## 2024-12-04 MED ORDER — WARFARIN SODIUM 5 MG PO TABS: ORAL_TABLET | ORAL | 1 refills | Status: AC

## 2024-12-08 ENCOUNTER — Telehealth: Payer: Self-pay | Admitting: Cardiology

## 2024-12-10 ENCOUNTER — Ambulatory Visit: Admitting: Cardiovascular Disease

## 2024-12-10 ENCOUNTER — Ambulatory Visit

## 2024-12-24 ENCOUNTER — Ambulatory Visit

## 2025-01-28 ENCOUNTER — Ambulatory Visit: Payer: Self-pay | Admitting: Pulmonary Disease

## 2025-03-02 ENCOUNTER — Ambulatory Visit: Admitting: Cardiovascular Disease
